# Patient Record
Sex: Male | Born: 1950 | Race: White | Hispanic: No | Marital: Married | Smoking: Never smoker
Health system: Southern US, Community
[De-identification: ages and names within clinical notes are randomized; demographics above are authoritative.]

## PROBLEM LIST (undated history)

## (undated) DIAGNOSIS — E119 Type 2 diabetes mellitus without complications: Secondary | ICD-10-CM

## (undated) DIAGNOSIS — Z9989 Dependence on other enabling machines and devices: Secondary | ICD-10-CM

## (undated) DIAGNOSIS — H269 Unspecified cataract: Secondary | ICD-10-CM

## (undated) DIAGNOSIS — G4733 Obstructive sleep apnea (adult) (pediatric): Secondary | ICD-10-CM

## (undated) DIAGNOSIS — G4730 Sleep apnea, unspecified: Secondary | ICD-10-CM

## (undated) DIAGNOSIS — T7840XA Allergy, unspecified, initial encounter: Secondary | ICD-10-CM

## (undated) DIAGNOSIS — E785 Hyperlipidemia, unspecified: Secondary | ICD-10-CM

## (undated) DIAGNOSIS — Z87442 Personal history of urinary calculi: Secondary | ICD-10-CM

## (undated) HISTORY — DX: Allergy, unspecified, initial encounter: T78.40XA

## (undated) HISTORY — DX: Sleep apnea, unspecified: G47.30

## (undated) HISTORY — DX: Unspecified cataract: H26.9

## (undated) HISTORY — PX: KNEE SURGERY: SHX244

## (undated) HISTORY — DX: Dependence on other enabling machines and devices: Z99.89

## (undated) HISTORY — DX: Type 2 diabetes mellitus without complications: E11.9

## (undated) HISTORY — DX: Morbid (severe) obesity due to excess calories: E66.01

## (undated) HISTORY — DX: Obstructive sleep apnea (adult) (pediatric): G47.33

## (undated) HISTORY — DX: Hyperlipidemia, unspecified: E78.5

## (undated) HISTORY — DX: Personal history of urinary calculi: Z87.442

---

## 1989-04-19 DIAGNOSIS — Z87442 Personal history of urinary calculi: Secondary | ICD-10-CM

## 1989-04-19 HISTORY — DX: Personal history of urinary calculi: Z87.442

## 2007-08-20 HISTORY — PX: COLONOSCOPY: SHX174

## 2008-02-18 ENCOUNTER — Ambulatory Visit: Payer: Self-pay | Admitting: Cardiovascular Disease

## 2008-02-18 ENCOUNTER — Ambulatory Visit: Payer: Self-pay | Admitting: Internal Medicine

## 2008-02-22 ENCOUNTER — Ambulatory Visit: Payer: Self-pay | Admitting: Pulmonary Disease

## 2008-02-22 DIAGNOSIS — G4733 Obstructive sleep apnea (adult) (pediatric): Secondary | ICD-10-CM | POA: Insufficient documentation

## 2008-02-22 HISTORY — DX: Obstructive sleep apnea (adult) (pediatric): G47.33

## 2008-03-04 ENCOUNTER — Encounter: Payer: Self-pay | Admitting: Internal Medicine

## 2008-03-04 ENCOUNTER — Ambulatory Visit: Payer: Self-pay | Admitting: Internal Medicine

## 2008-03-07 ENCOUNTER — Ambulatory Visit: Payer: Self-pay

## 2008-03-07 ENCOUNTER — Encounter: Payer: Self-pay | Admitting: Internal Medicine

## 2008-03-07 ENCOUNTER — Ambulatory Visit: Payer: Self-pay | Admitting: Cardiovascular Disease

## 2008-03-07 ENCOUNTER — Encounter: Payer: Self-pay | Admitting: Cardiovascular Disease

## 2011-01-01 NOTE — Assessment & Plan Note (Signed)
[name]                            CARDIOLOGY OFFICE NOTE   Rodney Turner, Rodney Turner                   MRN:          [id number]  DATE:02/18/2008                            DOB:          [birth year]    Rodney Turner is a pleasant 60 year old patient referred for dyspnea and  abnormal EKG.   The patient has no documented history of coronary artery disease.  Dr.  Cleta Turner did an EKG on him for physical, and he had Q-waves in II, III, and  F.  The patient has not had a previous EKG.  He does get some exertional  dyspnea.  It sounds functional, probably secondary to his weight.  He  has central obesity.  At one point, he was on a step diet and lost about  25 pounds, but he has gained it back over the last year and a half.  He  is fairly sedentary.  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   He has not had any significant chest pain.  In regards to his dyspnea,  he is a nonsmoker.  He does not have a cough.  No sputum production.  No  evidence of previous COPD.   He has not had a previous stress test.  I explained to him the issues  regarding an abnormal EKG, in particular, the question of an old  inferior wall MI.  I suspect it has more to do with rotation of his  heart.  However, he would benefit from an echocardiogram and a stress  Myoview to rule out any occult coronary disease and make sure his  dyspnea is due to central weight gain.   His review of systems is otherwise negative.   PAST MEDICAL HISTORY:  Fairly benign.  He has never had surgery.  He has  had a kidney stone 22 years ago, and otherwise, he has not been in the  hospital.   ALLERGIES:  He is allergic to CODEINE.   His review of systems is otherwise negative.   SOCIAL HISTORY:  He is happily married.  He has 2 older children.  He  golfs from time-to-time.  He sells copiers to school systems.   FAMILY HISTORY:  Remarkable for mother being alive at age 61.   Father  died of esophageal varices at age 29.   MEDICATIONS:  He takes no medicines routinely.   PHYSICAL EXAMINATION:  GENERAL:  His exam is remarkable for an  overweight white male in no distress.  VITAL SIGNS:  Blood pressure is 120/70, pulse 80 and regular,  respiratory 14, and afebrile.  His weight is 245.  HEENT:  Unremarkable. Carotids are normal without bruits.  No  lymphadenopathy, no JVP elevation.  LUNGS:  Clear, good diaphragmatic motion.  No wheezing.  HEART:  S1 and S2 with normal heart sounds.  PMI is not palpable.  ABDOMEN:  Protuberant.  Bowel sounds positive.  No AAA.  No tenderness.  No bruit.  No hepatosplenomegaly.  No hepatojugular reflux.  EXTREMITY:  Distal pulses intact.  No edema.  NEURO:  Nonfocal.  SKIN:  Warm and dry.  No muscular weakness.   EKG shows sinus rhythm with Qs in II, III, and F.   LABORATORY DATA:  His lab work was reviewed from urgent medical care.  His total cholesterol is 145 with an LDL of 80, TSH 1.5, and PSA 0.41.   Office notes from Dr. Cleta Turner reviewed.  EKG also reviewed.   IMPRESSION:  1. 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      Follow up stress Myoview.  2. Dyspnea.  Check 2-D echocardiogram, assess right ventricular and      left ventricular function.  The patient has a body habitus that may      suggest sleep apnea.  We will leave this up to Dr. Cleta Turner to suggest      testing for this.  3. Elevated fasting sugar at 108, probable development of metabolic      syndrome and type 2 diabetes.  I spoke to the patient at length      regarding his weight and possibility, such as the [employer].      He will probably need a hemoglobin A1c to be performed by Dr. Cleta Turner      in the future.  He understands the connection between insulin      resistance and type 2 diabetes.   As long as his echo and Myoview are not abnormal, he will see [place] on an  as-needed basis.     Rodney Pick. Eden Emms, MD, [facility]   Electronically Signed    PCN/MedQ  DD: 02/18/2008  DT: 02/19/2008  Job #: [id number]   cc:   Rodney Turner, M.D.

## 2011-10-20 ENCOUNTER — Ambulatory Visit (INDEPENDENT_AMBULATORY_CARE_PROVIDER_SITE_OTHER): Payer: BC Managed Care – PPO | Admitting: Family Medicine

## 2011-10-20 VITALS — BP 142/83 | HR 61 | Temp 98.2°F | Resp 18 | Ht 67.0 in | Wt 272.4 lb

## 2011-10-20 DIAGNOSIS — J069 Acute upper respiratory infection, unspecified: Secondary | ICD-10-CM

## 2011-10-20 MED ORDER — IPRATROPIUM BROMIDE 0.03 % NA SOLN: 2.0000 | Freq: Four times a day (QID) | NASAL | Status: DC

## 2011-10-20 MED ORDER — BENZONATATE 100 MG PO CAPS: 100.0000 mg | ORAL_CAPSULE | Freq: Three times a day (TID) | ORAL | Status: AC | PRN

## 2011-10-20 NOTE — Progress Notes (Signed)
  Patient Name: Rodney Turner Date of Birth: [birth year] Medical Record Number: [id number] Gender: male Date of Encounter: 10/20/2011  History of Present Illness:  Rodney Turner is a 61 y.o. very pleasant male patient who presents with the following:  Here today with chest congestion and cough- has been treating with mucinex and uses flonase daily.  He has had several episodes of congestion over the last year or so- actually uses mucinex every day.  He was outside in the rain on Wednesday- has been getting worse since then (over the last 3 days).  Added mucinex DM.  Cough has gotten worse, has more of a sore throat which may be due to drainage.  Is coughing up some mucus which is clear.  No fever, no chills, no aches.  However he does feel "rotten."  No earache, no runny nose.    Patient Active Problem List  Diagnoses  . OBSTRUCTIVE SLEEP APNEA   No past medical history on file. No past surgical history on file. History  Substance Use Topics  . Smoking status: Never Smoker   . Smokeless tobacco: Not on file  . Alcohol Use: Not on file   No family history on file. Allergies  Allergen Reactions  . Codeine     REACTION: nausea    Medication list has been reviewed and updated.  Review of Systems: As per HPI- otherwise negative.   Physical Examination: Filed Vitals:   10/20/11 1116  BP: 142/83  Pulse: 61  Temp: 98.2 F (36.8 C)  TempSrc: Oral  Resp: 18  Height: 5\' 7"  (1.702 m)  Weight: 272 lb 6.4 oz (123.56 kg)    Body mass index is 42.66 kg/(m^2).  GEN: WDWN, NAD, Non-toxic, A & O x 3 HEENT: Atraumatic, Normocephalic. Neck supple. No masses, No LAD. TM and oropharynx wnl bilaterally, PEERL Ears and Nose: No external deformity. CV: RRR, No M/G/R. No JVD. No thrill. No extra heart sounds. PULM: CTA B, no wheezes, crackles, rhonchi. No retractions. No resp. distress. No accessory muscle use. ABD: S, NT, ND, +BS. No rebound. No HSM. EXTR: No c/c/e NEURO Normal  gait.  PSYCH: Normally interactive. Conversant. Not depressed or anxious appearing.  Calm demeanor.    Assessment and Plan: 1. URI (upper respiratory infection)  benzonatate (TESSALON) 100 MG capsule, ipratropium (ATROVENT) 0.03 % nasal spray   Treat as above for likely viral URI.  Did give zpack rx to hold- can use if still not better in the next 2 or 3 days.  Let us know if worse!

## 2011-11-04 ENCOUNTER — Ambulatory Visit (INDEPENDENT_AMBULATORY_CARE_PROVIDER_SITE_OTHER): Payer: BC Managed Care – PPO | Admitting: Family Medicine

## 2011-11-04 VITALS — BP 137/83 | HR 71 | Temp 98.1°F | Resp 18 | Ht 68.0 in | Wt 276.0 lb

## 2011-11-04 DIAGNOSIS — M7150 Other bursitis, not elsewhere classified, unspecified site: Secondary | ICD-10-CM

## 2011-11-04 DIAGNOSIS — M7750 Other enthesopathy of unspecified foot: Secondary | ICD-10-CM

## 2011-11-04 MED ORDER — METHYLPREDNISOLONE 4 MG PO TABS: ORAL_TABLET | ORAL | Status: DC

## 2011-11-04 NOTE — Patient Instructions (Signed)
Tendinitis  Tendinitis is swelling and inflammation of the tendons. Tendons are band-like tissues that connect muscle to bone. Tendinitis commonly occurs in the:    Shoulders (rotator cuff).   Heels (Achilles tendon).   Elbows (triceps tendon).  CAUSES  Tendinitis is usually caused by overusing the tendon, muscles, and joints involved. When the tissue surrounding a tendon (synovium) becomes inflamed, it is called tenosynovitis. Tendinitis commonly develops in people whose jobs require repetitive motions.  SYMPTOMS   Pain.   Tenderness.   Mild swelling.  DIAGNOSIS  Tendinitis is usually diagnosed by physical exam. Your caregiver may also order X-rays or other imaging tests.  TREATMENT  Your caregiver may recommend certain medicines or exercises for your treatment.  HOME CARE INSTRUCTIONS    Use a sling or splint for as long as directed by your caregiver until the pain decreases.   Put ice on the injured area.   Put ice in a plastic bag.   Place a towel between your skin and the bag.   Leave the ice on for 15 to 20 minutes, 3 to 4 times a day.   Avoid using the limb while the tendon is painful. Perform gentle range of motion exercises only as directed by your caregiver. Stop exercises if pain or discomfort increase, unless directed otherwise by your caregiver.   Only take over-the-counter or prescription medicines for pain, discomfort, or fever as directed by your caregiver.  SEEK MEDICAL CARE IF:    Your pain and swelling increase.   You develop new, unexplained symptoms, especially increased numbness in the hands.  MAKE SURE YOU:    Understand these instructions.   Will watch your condition.   Will get help right away if you are not doing well or get worse.  Document Released: 08/02/2000 Document Revised: 07/25/2011 Document Reviewed: 10/22/2010  [employer] Patient Information 2012 [employer].

## 2011-11-04 NOTE — Progress Notes (Signed)
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  No h/o gout.  Pain with forced plantar flexion of said toes.  Unresponsive to ibuprofen  O:  Exam shoes mild STS dorsal foot.  The dorsal metatarsal area is area of pain.  FROM but painful active dorsiflexion of toes 2-4   A:  Tendonitis left foot  P:  Medrol 4 mg ii daily with food x 5

## 2012-01-10 ENCOUNTER — Encounter: Payer: Self-pay | Admitting: Pulmonary Disease

## 2012-01-10 ENCOUNTER — Ambulatory Visit (INDEPENDENT_AMBULATORY_CARE_PROVIDER_SITE_OTHER): Payer: BC Managed Care – PPO | Admitting: Pulmonary Disease

## 2012-01-10 VITALS — BP 120/70 | HR 70 | Temp 98.0°F | Ht 67.0 in | Wt 278.6 lb

## 2012-01-10 DIAGNOSIS — G4733 Obstructive sleep apnea (adult) (pediatric): Secondary | ICD-10-CM

## 2012-01-10 NOTE — Patient Instructions (Signed)
Will set up for home sleep testing.  Will arrange followup once results are available.

## 2012-01-10 NOTE — Assessment & Plan Note (Signed)
The patient's history is very suggestive of significant sleep disordered breathing, and when coupled with his obesity and abnormal upper airway, it is very likely that he has clinically significant sleep apnea.  I've had a long discussion with him about sleep apnea, including the pathophysiology and impact the quality of life and cardiovascular health.  Will arrange for home sleep testing, and the patient will followup once the results are available.

## 2012-01-10 NOTE — Progress Notes (Signed)
Addended by: Darrell Jewel on: 01/10/2012 01:51 PM   Modules accepted: Orders

## 2012-01-10 NOTE — Progress Notes (Signed)
  Subjective:    Patient ID: Rodney Turner, male    DOB: 25-Jul-1951, 61 y.o.   MRN: [id number]  HPI The patient is a 61 year old male who comes in today as a self-referral for possible obstructive sleep apnea.  He was last seen 3-4 years ago with a history that suggested sleep disordered breathing.  He was not overly symptomatic, and had no comorbid medical issues.  At that time, the patient decided to take 6 months and work aggressively on weight loss.  He comes in today where he has gained almost 20 pounds since that visit, and is having increased symptoms.  The wife notes very loud snoring as well as an abnormal breathing pattern during sleep.  The patient does not feel rested in the mornings upon arising.  The patient notes definite sleep pressure during the day with periods of inactivity, and the wife has noted that he will fall asleep easily reading or watching television during the day.  Patient also noted some slight pressure with driving long distances.  His Epworth sleepiness score today is 9.   Review of Systems  Constitutional: Negative for fever and unexpected weight change.  HENT: Positive for rhinorrhea and postnasal drip. Negative for ear pain, nosebleeds, congestion, sore throat, sneezing, trouble swallowing, dental problem and sinus pressure.   Eyes: Negative for redness and itching.  Respiratory: Positive for shortness of breath. Negative for cough, chest tightness and wheezing.   Cardiovascular: Negative for palpitations and leg swelling.  Gastrointestinal: Negative for nausea and vomiting.  Genitourinary: Negative for dysuria.  Musculoskeletal: Negative for joint swelling.  Skin: Negative for rash.  Neurological: Positive for headaches.  Hematological: Does not bruise/bleed easily.  Psychiatric/Behavioral: Negative for dysphoric mood. The patient is not nervous/anxious.        Objective:   Physical Exam Constitutional:  Obese male, no acute distress  HENT:  Nares  patent without discharge  Oropharynx without exudate, palate and uvula are elongated, but small posterior space.  Eyes:  Perrla, eomi, no scleral icterus  Neck:  No JVD, no TMG  Cardiovascular:  Normal rate, regular rhythm, no rubs or gallops.  No murmurs        Intact distal pulses  Pulmonary :  Normal breath sounds, no stridor or respiratory distress   No rales, rhonchi, or wheezing  Abdominal:  Soft, nondistended, bowel sounds present.  No tenderness noted.   Musculoskeletal:  Minimal lower extremity edema noted.  Lymph Nodes:  No cervical lymphadenopathy noted  Skin:  No cyanosis noted  Neurologic:  Appears mildly sleepy, appropriate, moves all 4 extremities without obvious deficit.         Assessment & Plan:

## 2012-01-21 ENCOUNTER — Telehealth: Payer: Self-pay | Admitting: Pulmonary Disease

## 2012-01-21 NOTE — Telephone Encounter (Signed)
Called and spoke with AJ at [facility] on 01/20/12. He stated that prior authorization was required for [address] through [employer]. Called [employer] and spoke with Byrd Hesselbach at [employer] who approved home sleep study (Ref # [id number]) Valid dated 6/3-03/19/12. Called and spoke with patient and we scheduled him to pick up home sleep study device on Wed. 01/22/12 around 11:30 am. Pt is aware of above information and will call me directly if he has any other additional questions or problems. Pt plans on doing this study Wed night 01/22/12 and return device on on 01/23/12.

## 2012-01-28 ENCOUNTER — Telehealth: Payer: Self-pay | Admitting: Pulmonary Disease

## 2012-01-28 NOTE — Telephone Encounter (Signed)
Please get pt in for f/u to discuss sleep study results.

## 2012-01-29 ENCOUNTER — Ambulatory Visit (INDEPENDENT_AMBULATORY_CARE_PROVIDER_SITE_OTHER): Payer: BC Managed Care – PPO | Admitting: Pulmonary Disease

## 2012-01-29 DIAGNOSIS — G4733 Obstructive sleep apnea (adult) (pediatric): Secondary | ICD-10-CM

## 2012-01-29 NOTE — Telephone Encounter (Signed)
Pt is scheduled for follow-up on Fri., 6/14 @ 4:30pm with KC to discuss sleep results.

## 2012-01-31 ENCOUNTER — Ambulatory Visit (INDEPENDENT_AMBULATORY_CARE_PROVIDER_SITE_OTHER): Payer: BC Managed Care – PPO | Admitting: Pulmonary Disease

## 2012-01-31 ENCOUNTER — Encounter: Payer: Self-pay | Admitting: Pulmonary Disease

## 2012-01-31 VITALS — BP 130/80 | HR 68 | Temp 98.0°F | Ht 67.0 in | Wt 281.2 lb

## 2012-01-31 DIAGNOSIS — G4733 Obstructive sleep apnea (adult) (pediatric): Secondary | ICD-10-CM

## 2012-01-31 NOTE — Assessment & Plan Note (Signed)
The patient has severe obstructive sleep apnea by his recent sleep study, and I have recommended a trial of CPAP while he is trying to work on weight loss.  The patient is willing to do this.  I have also encouraged him to work aggressively on weight loss. I will set the patient up on cpap at a moderate pressure level to allow for desensitization, and will troubleshoot the device over the next 4-6weeks if needed.  The pt is to call me if having issues with tolerance.  Will then optimize the pressure once patient is able to wear cpap on a consistent basis.

## 2012-01-31 NOTE — Progress Notes (Signed)
  Subjective:    Patient ID: Rodney Turner, male    DOB: October 13, 1950, 61 y.o.   MRN: [id number]  HPI The patient comes in today for followup of his recent sleep study.  He was found to have severe obstructive sleep apnea, with an AHI of 51 events per hour.  I have reviewed the study in detail with him, and answered all of his questions.   Review of Systems  Constitutional: Negative for fever and unexpected weight change.  HENT: Positive for rhinorrhea and postnasal drip. Negative for ear pain, nosebleeds, congestion, sore throat, sneezing, trouble swallowing, dental problem and sinus pressure.   Eyes: Negative for redness and itching.  Respiratory: Positive for shortness of breath. Negative for cough, chest tightness and wheezing.   Cardiovascular: Negative for palpitations and leg swelling.  Gastrointestinal: Negative for nausea and vomiting.  Genitourinary: Negative for dysuria.  Musculoskeletal: Negative for joint swelling.  Skin: Negative for rash.  Neurological: Negative for headaches.  Hematological: Does not bruise/bleed easily.  Psychiatric/Behavioral: Negative for dysphoric mood. The patient is not nervous/anxious.        Objective:   Physical Exam Obese male in no acute distress Nose without purulence or discharge noted Lower extremities with mild edema, no cyanosis Alert and oriented, moves all 4 extremities.       Assessment & Plan:

## 2012-01-31 NOTE — Patient Instructions (Addendum)
Will start on cpap at moderate pressure level.  Please call if having tolerance issues.  Work on weight loss followup with me in 6 weeks.  

## 2012-02-05 ENCOUNTER — Telehealth: Payer: Self-pay | Admitting: Pulmonary Disease

## 2012-02-05 NOTE — Telephone Encounter (Signed)
Spoke with Lecretia-states that [facility] told her that the insurance requires auto titration; we have never heard of this nor have we had any kick backs from insurance after placing a pt on CPAP based on home sleep study. Therefore no order sent for auto titration and Mayra Reel is handling this with [facility].

## 2012-03-18 ENCOUNTER — Encounter: Payer: Self-pay | Admitting: Pulmonary Disease

## 2012-03-18 ENCOUNTER — Ambulatory Visit (INDEPENDENT_AMBULATORY_CARE_PROVIDER_SITE_OTHER): Payer: BC Managed Care – PPO | Admitting: Pulmonary Disease

## 2012-03-18 VITALS — BP 120/80 | HR 61 | Temp 97.7°F | Ht 67.0 in | Wt 276.4 lb

## 2012-03-18 DIAGNOSIS — G4733 Obstructive sleep apnea (adult) (pediatric): Secondary | ICD-10-CM

## 2012-03-18 NOTE — Progress Notes (Signed)
  Subjective:    Patient ID: Rodney Turner, male    DOB: November 12, 1950, 61 y.o.   MRN: [id number]  HPI The patient comes in today for followup of his known sleep apnea.  He was started on CPAP at a moderate pressure level of the last visit, and is done very well with the device.  He is currently using nasal was without issues, and has no problems with pressure tolerance.  He feels that his sleep is much improved, and has had increased daytime alertness.   Review of Systems  Constitutional: Negative for fever and unexpected weight change.  HENT: Positive for rhinorrhea. Negative for ear pain, nosebleeds, congestion, sore throat, sneezing, trouble swallowing, dental problem, postnasal drip and sinus pressure.   Eyes: Negative for redness and itching.  Respiratory: Negative for cough, chest tightness, shortness of breath and wheezing.   Cardiovascular: Negative for palpitations and leg swelling.  Gastrointestinal: Negative for nausea and vomiting.  Genitourinary: Negative for dysuria.  Musculoskeletal: Negative for joint swelling.  Skin: Negative for rash.  Neurological: Negative for headaches.  Hematological: Does not bruise/bleed easily.  Psychiatric/Behavioral: Negative for dysphoric mood. The patient is not nervous/anxious.   All other systems reviewed and are negative.       Objective:   Physical Exam overweight male in no acute distress No skin breakdown or pressure necrosis from the CPAP mask Lower extremities without edema, no cyanosis Alert and oriented, moves all 4 extremities, does not appear to be sleepy.       Assessment & Plan:

## 2012-03-18 NOTE — Assessment & Plan Note (Signed)
The patient is doing very well on CPAP, and has seen significant improvement in his sleep and daytime alertness.  At this point, we need to optimize his pressure for him, and we'll do this through the automatic setting.  I've also encouraged him to work aggressively on weight loss. Care Plan:  At this point, will arrange for the patient's machine to be changed over to auto mode for 2 weeks to optimize their pressure.  I will review the downloaded data once sent by dme, and also evaluate for compliance, leaks, and residual osa.  I will call the patient and dme to discuss the results, and have the patient's machine set appropriately.  This will serve as the pt's cpap pressure titration.

## 2012-03-18 NOTE — Patient Instructions (Addendum)
Will put your machine on the auto setting to optimize pressure, and will let you know the results. Work on weight loss followup with me in 6mos.

## 2012-03-19 ENCOUNTER — Encounter: Payer: Self-pay | Admitting: Pulmonary Disease

## 2012-04-29 ENCOUNTER — Encounter: Payer: Self-pay | Admitting: Pulmonary Disease

## 2012-04-30 ENCOUNTER — Other Ambulatory Visit: Payer: Self-pay | Admitting: Pulmonary Disease

## 2012-04-30 DIAGNOSIS — G4733 Obstructive sleep apnea (adult) (pediatric): Secondary | ICD-10-CM

## 2012-06-23 ENCOUNTER — Ambulatory Visit (INDEPENDENT_AMBULATORY_CARE_PROVIDER_SITE_OTHER): Payer: BC Managed Care – PPO | Admitting: *Deleted

## 2012-06-23 DIAGNOSIS — Z23 Encounter for immunization: Secondary | ICD-10-CM

## 2012-07-10 ENCOUNTER — Ambulatory Visit (INDEPENDENT_AMBULATORY_CARE_PROVIDER_SITE_OTHER): Payer: BC Managed Care – PPO | Admitting: Physician Assistant

## 2012-07-10 VITALS — BP 130/78 | HR 73 | Temp 97.9°F | Resp 16 | Ht 67.0 in | Wt 279.0 lb

## 2012-07-10 DIAGNOSIS — R0982 Postnasal drip: Secondary | ICD-10-CM

## 2012-07-10 DIAGNOSIS — R05 Cough: Secondary | ICD-10-CM

## 2012-07-10 DIAGNOSIS — J329 Chronic sinusitis, unspecified: Secondary | ICD-10-CM

## 2012-07-10 MED ORDER — IPRATROPIUM BROMIDE 0.03 % NA SOLN: 2.0000 | Freq: Two times a day (BID) | NASAL | Status: DC

## 2012-07-10 MED ORDER — BENZONATATE 200 MG PO CAPS: 200.0000 mg | ORAL_CAPSULE | Freq: Three times a day (TID) | ORAL | Status: DC | PRN

## 2012-07-10 NOTE — Progress Notes (Signed)
  Subjective:    Patient ID: Rodney Turner, male    DOB: 12-Oct-1950, 61 y.o.   MRN: [id number]  HPI  Rodney Turner is a 61 yr old male here with three days of malaise and fatigue and one day of cough.  This cough is minimally productive.  Feels like it is in his throat more than his chest.  Voice is becoming hoarse.  Denies sore throat, ear pain, headache, or runny nose.  Some nasal congestion has started today.  Feels like there is some post-nasal drainage.  No fever or chills.  Wife has been coughing for two weeks as well.  History of OSA.  Uses CPAP nightly.  No asthma or COPD history.  Never smoker.  Has been using flonase daily, mucinex bid, and tessalon perles.   Review of Systems  Constitutional: Negative for fever and diaphoresis.  HENT: Positive for postnasal drip. Negative for ear pain, congestion, sore throat, rhinorrhea and sinus pressure.   Respiratory: Positive for cough. Negative for shortness of breath and wheezing.   Cardiovascular: Negative.   Gastrointestinal: Negative.   Musculoskeletal: Negative.   Skin: Negative.   Neurological: Negative.        Objective:   Physical Exam  Vitals reviewed. Constitutional: He is oriented to person, place, and time. He appears well-developed and well-nourished. No distress.  HENT:  Head: Normocephalic and atraumatic.  Right Ear: Tympanic membrane and ear canal normal.  Left Ear: Tympanic membrane and ear canal normal.  Nose: Right sinus exhibits no maxillary sinus tenderness and no frontal sinus tenderness. Left sinus exhibits no maxillary sinus tenderness and no frontal sinus tenderness.  Mouth/Throat: Uvula is midline, oropharynx is clear and moist and mucous membranes are normal.  Neck: Neck supple.  Cardiovascular: Normal rate, regular rhythm and normal heart sounds.  Exam reveals no gallop and no friction rub.   No murmur heard. Pulmonary/Chest: Effort normal and breath sounds normal. He has no wheezes. He has no rales.    Lymphadenopathy:    He has no cervical adenopathy.  Neurological: He is alert and oriented to person, place, and time.  Skin: Skin is warm and dry.  Psychiatric: He has a normal mood and affect. His behavior is normal.     Filed Vitals:   07/10/12 1644  BP: 130/78  Pulse: 73  Temp: 97.9 F (36.6 C)  Resp: 16         Assessment & Plan:   1. Cough  benzonatate (TESSALON) 200 MG capsule  2. Post-nasal drainage  ipratropium (ATROVENT) 0.03 % nasal spray    Rodney Turner is a 61 yr old male here with cough.  He is afebrile and lungs are CTA.  Suspect viral etiology.  Will treat with Atrovent for post-nasal drainage and Tessalon for cough.  He may continue to use Mucinex.  Encouraged him to continue Flonase daily.  He would like to try OTC Delsym for cough.  If he requires something stronger, he will call and we can send something in for him.  Push fluids and rest as much as possible.  If worsening or not improving he will RTC.

## 2012-07-10 NOTE — Patient Instructions (Signed)
Use Tessalon Perles up to three times per day for cough.  Try Delsym for cough at night.  If Delsym is not strong enough, let us know and we can send something stronger.  Use Atrovent nasal spray twice daily for relief of post-nasal drainage.  You may continue using Mucinex twice daily as well.  Drink plenty of fluids and get plenty of rest.  If you are worsening or not improving, call or come back in.

## 2012-08-25 ENCOUNTER — Telehealth: Payer: Self-pay | Admitting: *Deleted

## 2012-08-25 MED ORDER — FLUTICASONE PROPIONATE 50 MCG/ACT NA SUSP: 2.0000 | Freq: Every day | NASAL | Status: DC

## 2012-08-25 NOTE — Telephone Encounter (Signed)
Pharmacy requesting refill on flonase. Last refill on 06/24/12

## 2012-08-25 NOTE — Telephone Encounter (Signed)
Flonase sent to pharmacy

## 2012-08-25 NOTE — Telephone Encounter (Signed)
Called pt, [facility] sent

## 2012-09-18 ENCOUNTER — Ambulatory Visit (INDEPENDENT_AMBULATORY_CARE_PROVIDER_SITE_OTHER): Payer: BC Managed Care – PPO | Admitting: Pulmonary Disease

## 2012-09-18 ENCOUNTER — Encounter: Payer: Self-pay | Admitting: Pulmonary Disease

## 2012-09-18 VITALS — BP 128/82 | HR 71 | Temp 97.9°F | Ht 67.5 in | Wt 275.0 lb

## 2012-09-18 DIAGNOSIS — G4733 Obstructive sleep apnea (adult) (pediatric): Secondary | ICD-10-CM

## 2012-09-18 NOTE — Assessment & Plan Note (Signed)
The patient is doing very well on CPAP with no tolerance issues.  He is satisfied with his sleep and daytime alertness.  I've asked him to work aggressively on weight loss, and to keep up with his mask changes and supplies.

## 2012-09-18 NOTE — Patient Instructions (Addendum)
Continue with cpap, and work on weight loss followup with me in one year if doing well.

## 2012-09-18 NOTE — Progress Notes (Signed)
  Subjective:    Patient ID: VIRGIL LIGHTNER, male    DOB: 10-15-1950, 62 y.o.   MRN: [id number]  HPI The patient comes in today for followup of his known severe obstructive sleep apnea.  He is wearing CPAP compliantly, and feels that he is sleeping well with excellent daytime alertness.  He is having no issues with his CPAP mask, and feels that his pressure is adequate since being placed on a fixed setting.   Review of Systems  Constitutional: Negative for fever and unexpected weight change.  HENT: Positive for rhinorrhea. Negative for ear pain, nosebleeds, congestion, sore throat, sneezing, trouble swallowing, dental problem, postnasal drip and sinus pressure.   Eyes: Negative for redness and itching.  Respiratory: Negative for cough, chest tightness, shortness of breath and wheezing.   Cardiovascular: Negative for palpitations and leg swelling.  Gastrointestinal: Negative for nausea and vomiting.  Genitourinary: Negative for dysuria.  Musculoskeletal: Negative for joint swelling.  Skin: Negative for rash.  Neurological: Negative for headaches.  Hematological: Does not bruise/bleed easily.  Psychiatric/Behavioral: Negative for dysphoric mood. The patient is not nervous/anxious.        Objective:   Physical Exam Obese male in no acute distress Nose without purulence or discharge noted No skin breakdown or pressure necrosis from the CPAP mask Neck without lymphadenopathy or thyromegaly Lower extremities without significant edema, no cyanosis Alert and oriented, moves all 4 extremities.  Does not appear to be sleepy.       Assessment & Plan:

## 2013-05-27 ENCOUNTER — Other Ambulatory Visit: Payer: Self-pay | Admitting: Emergency Medicine

## 2013-07-01 ENCOUNTER — Other Ambulatory Visit: Payer: Self-pay | Admitting: Physician Assistant

## 2013-07-07 ENCOUNTER — Ambulatory Visit (INDEPENDENT_AMBULATORY_CARE_PROVIDER_SITE_OTHER): Payer: BC Managed Care – PPO | Admitting: Family Medicine

## 2013-07-07 VITALS — BP 130/80 | HR 84 | Temp 98.0°F | Resp 17 | Ht 67.0 in | Wt 284.0 lb

## 2013-07-07 DIAGNOSIS — J309 Allergic rhinitis, unspecified: Secondary | ICD-10-CM

## 2013-07-07 DIAGNOSIS — Z23 Encounter for immunization: Secondary | ICD-10-CM

## 2013-07-07 MED ORDER — FLUTICASONE PROPIONATE 50 MCG/ACT NA SUSP: 2.0000 | Freq: Every day | NASAL | Status: DC

## 2013-07-07 NOTE — Progress Notes (Signed)
[facility]:  Office Visit  Chief Complaint:  Chief Complaint  Patient presents with  . Immunizations    flu shot   . Medication Refill    HPI: Rodney Turner is a 62 y.o. male who is here for refills for his allergic rhinitis medicine, flonase, uses it regular. No SEs, He gets clogged up when he does not use it. He also uses Mucinex daily and he stays clear with that combination. When he comes off mucinex then he notices that there is a little bit of difference. He has OSA on CPAP . HE is complaint with CPAP. Would also like flu vaccine.   Past Medical History  Diagnosis Date  . Allergic rhinitis    Past Surgical History  Procedure Laterality Date  . Knee surgery      left   History   Social History  . Marital Status: Married    Spouse Name: N/A    Number of Children: N/A  . Years of Education: N/A   Occupational History  . sales    Social History Main Topics  . Smoking status: Never Smoker   . Smokeless tobacco: Never Used  . Alcohol Use: No  . Drug Use: No  . Sexual Activity: Yes    Birth Control/ Protection: None   Other Topics Concern  . None   Social History Narrative  . None   Family History  Problem Relation Age of Onset  . Snoring Father   . Esophageal varices Father   . Diabetes Paternal Grandmother    Allergies  Allergen Reactions  . Codeine     REACTION: nausea   Prior to Admission medications   Medication Sig Start Date End Date Taking? Authorizing Provider  aspirin 81 MG tablet Take 81 mg by mouth daily.   Yes Historical Provider, MD  benzonatate (TESSALON) 200 MG capsule Take 1 capsule (200 mg total) by mouth 3 (three) times daily as needed for cough. 07/10/12  Yes Eleanore E Egan, PA-C  fluticasone (FLONASE) 50 MCG/ACT nasal spray Place 2 sprays into the nose daily. PATIENT NEEDS OFFICE VISIT FOR ADDITIONAL REFILLS 05/27/13  Yes Eleanore E Egan, PA-C  guaiFENesin (MUCINEX) 600 MG 12 hr tablet Take by mouth 2 (two)  times daily.   Yes Historical Provider, MD  ibuprofen (ADVIL,MOTRIN) 100 MG tablet Take 100 mg by mouth as needed.   Yes Historical Provider, MD     ROS: The patient denies fevers, chills, night sweats, unintentional weight loss, chest pain, palpitations, wheezing, dyspnea on exertion, nausea, vomiting, abdominal pain, dysuria, hematuria, melena, numbness, weakness, or tingling.   All other systems have been reviewed and were otherwise negative with the exception of those mentioned in the HPI and as above.    PHYSICAL EXAM: Filed Vitals:   07/07/13 1435  BP: 130/80  Pulse: 84  Temp: 98 F (36.7 C)  Resp: 17   Filed Vitals:   07/07/13 1435  Height: 5\' 7"  (1.702 m)  Weight: 284 lb (128.822 kg)   Body mass index is 44.47 kg/(m^2).  General: Alert, no acute distress, morbidly obese White male HEENT:  Normocephalic, atraumatic, oropharynx patent. EOMI, PERRLA, boggy nares, TM nl Cardiovascular:  Regular rate and rhythm, no rubs murmurs or gallops.  No Carotid bruits, radial pulse intact. No pedal edema.  Respiratory: Clear to auscultation bilaterally.  No wheezes, rales, or rhonchi.  No cyanosis, no use of accessory musculature GI: No organomegaly, abdomen is soft and non-tender, positive bowel sounds.  No masses. Skin: No rashes. Neurologic: Facial musculature symmetric. Psychiatric: Patient is appropriate throughout our interaction. Lymphatic: No cervical lymphadenopathy Musculoskeletal: Gait intact.   LABS: No results found for this or any previous visit.   EKG/XRAY:   Primary read interpreted by Dr. Conley Rolls at [facility].   ASSESSMENT/PLAN: Encounter Diagnoses  Name Primary?  . Allergic rhinitis Yes  . Needs flu shot    Refilled Flonase Flu vaccine given F/u prn  Gross sideeffects, risk and benefits, and alternatives of medications d/w patient. Patient is aware that all medications have potential sideeffects and we are unable to predict every sideeffect or drug-drug  interaction that may occur.  Lakevia Perris PHUONG, DO 07/07/2013 3:13 PM

## 2013-08-08 ENCOUNTER — Ambulatory Visit (INDEPENDENT_AMBULATORY_CARE_PROVIDER_SITE_OTHER): Payer: BC Managed Care – PPO | Admitting: Family Medicine

## 2013-08-08 VITALS — BP 152/78 | HR 76 | Temp 98.8°F | Resp 16 | Ht 67.0 in | Wt 286.0 lb

## 2013-08-08 DIAGNOSIS — J069 Acute upper respiratory infection, unspecified: Secondary | ICD-10-CM

## 2013-08-08 MED ORDER — AZITHROMYCIN 250 MG PO TABS: ORAL_TABLET | ORAL | Status: DC

## 2013-08-08 MED ORDER — HYDROCODONE-HOMATROPINE 5-1.5 MG/5ML PO SYRP: 5.0000 mL | ORAL_SOLUTION | Freq: Three times a day (TID) | ORAL | Status: DC | PRN

## 2013-08-08 NOTE — Progress Notes (Signed)
Rodney Turner MRN: [id number], DOB: Dec 19, 1950, 62 y.o. Date of Encounter: 08/08/2013, 1:55 PM  Primary Physician: Lucilla Edin, MD  Chief Complaint:  Chief Complaint  Patient presents with  . Cough  . Nasal Congestion    HPI: 62 y.o. year old male presents with a 2 day history of nasal congestion, post nasal drip, sore throat, and cough. Mild sinus pressure. Afebrile. No chills. Nasal congestion thick and green/yellow. Cough is productive of green/yellow sputum and not associated with time of day. Ears feel full, leading to sensation of muffled hearing. Has tried OTC cold preps without success. No GI complaints.   No sick contacts, recent antibiotics, or recent travels.   No leg trauma, sedentary periods, h/o cancer, or tobacco use.  Past Medical History  Diagnosis Date  . Allergic rhinitis      Home Meds: Prior to Admission medications   Medication Sig Start Date End Date Taking? Authorizing Provider  aspirin 81 MG tablet Take 81 mg by mouth daily.   Yes Historical Provider, MD  fluticasone (FLONASE) 50 MCG/ACT nasal spray Place 2 sprays into both nostrils daily. 07/07/13  Yes Thao P Le, DO  guaiFENesin (MUCINEX) 600 MG 12 hr tablet Take by mouth 2 (two) times daily.   Yes Historical Provider, MD  benzonatate (TESSALON) 200 MG capsule Take 1 capsule (200 mg total) by mouth 3 (three) times daily as needed for cough. 07/10/12   Eleanore Delia Chimes, PA-C  ibuprofen (ADVIL,MOTRIN) 100 MG tablet Take 100 mg by mouth as needed.    Historical Provider, MD    Allergies:  Allergies  Allergen Reactions  . Codeine     REACTION: nausea    History   Social History  . Marital Status: Married    Spouse Name: N/A    Number of Children: N/A  . Years of Education: N/A   Occupational History  . sales    Social History Main Topics  . Smoking status: Never Smoker   . Smokeless tobacco: Never Used  . Alcohol Use: No  . Drug Use: No  . Sexual Activity: Yes    Birth Control/  Protection: None   Other Topics Concern  . Not on file   Social History Narrative  . No narrative on file     Review of Systems: Constitutional: negative for chills, fever, night sweats or weight changes Cardiovascular: negative for chest pain or palpitations Respiratory: negative for hemoptysis, wheezing, or shortness of breath Abdominal: negative for abdominal pain, nausea, vomiting or diarrhea Dermatological: negative for rash Neurologic: negative for headache   Physical Exam: Blood pressure 152/78, pulse 76, temperature 98.8 F (37.1 C), temperature source Oral, resp. rate 16, height 5\' 7"  (1.702 m), weight 286 lb (129.729 kg), SpO2 96.00%., Body mass index is 44.78 kg/(m^2). General: Well developed, well nourished, in no acute distress. Head: Normocephalic, atraumatic, eyes without discharge, sclera non-icteric, nares are congested. Bilateral auditory canals clear, TM's are without perforation, pearly grey with reflective cone of light bilaterally. No sinus TTP. Oral cavity moist, dentition normal. Posterior pharynx with post nasal drip and mild erythema. No peritonsillar abscess or tonsillar exudate. Neck: Supple. No thyromegaly. Full ROM. No lymphadenopathy. Lungs: Coarse breath sounds bilaterally without wheezes, rales, or rhonchi. Breathing is unlabored.  Heart: RRR with S1 S2. No murmurs, rubs, or gallops appreciated. Msk:  Strength and tone normal for age. Extremities: No clubbing or cyanosis. No edema. Neuro: Alert and oriented X 3. Moves all extremities spontaneously. CNII-XII grossly in tact. Psych:  Responds to questions appropriately with a normal affect.    ASSESSMENT AND PLAN:  62 y.o. year old male with bronchitis. Acute URI - Plan: azithromycin (ZITHROMAX Z-PAK) 250 MG tablet, HYDROcodone-homatropine (HYCODAN) 5-1.5 MG/5ML syrup   - -Tylenol/Motrin prn -Rest/fluids -RTC precautions -RTC 3-5 days if no improvement  Signed, Elvina Sidle,  MD 08/08/2013 1:55 PM

## 2013-09-17 ENCOUNTER — Encounter: Payer: Self-pay | Admitting: Pulmonary Disease

## 2013-09-17 ENCOUNTER — Encounter (INDEPENDENT_AMBULATORY_CARE_PROVIDER_SITE_OTHER): Payer: Self-pay

## 2013-09-17 ENCOUNTER — Ambulatory Visit (INDEPENDENT_AMBULATORY_CARE_PROVIDER_SITE_OTHER): Payer: BC Managed Care – PPO | Admitting: Pulmonary Disease

## 2013-09-17 VITALS — BP 140/92 | HR 69 | Temp 98.2°F | Ht 67.0 in | Wt 287.0 lb

## 2013-09-17 DIAGNOSIS — G4733 Obstructive sleep apnea (adult) (pediatric): Secondary | ICD-10-CM

## 2013-09-17 NOTE — Progress Notes (Signed)
   Subjective:    Patient ID: Rodney Turner, male    DOB: 1950-10-26, 63 y.o.   MRN: [id number]  HPI The patient comes in today for followup of his obstructive sleep apnea. He is wearing CPAP compliantly, and is not having any issues with his mask or pressure. He feels that he is sleeping well with the device, and has adequate daytime alertness. Of note, his weight is up 12 pounds since last visit.   Review of Systems  Constitutional: Negative for fever and unexpected weight change.  HENT: Positive for congestion. Negative for dental problem, ear pain, nosebleeds, postnasal drip, rhinorrhea, sinus pressure, sneezing, sore throat and trouble swallowing.   Eyes: Negative for redness and itching.  Respiratory: Negative for cough, chest tightness, shortness of breath and wheezing.   Cardiovascular: Negative for palpitations and leg swelling.  Gastrointestinal: Negative for nausea and vomiting.  Genitourinary: Negative for dysuria.  Musculoskeletal: Negative for joint swelling.  Skin: Negative for rash.  Neurological: Negative for headaches.  Hematological: Does not bruise/bleed easily.  Psychiatric/Behavioral: Negative for dysphoric mood. The patient is not nervous/anxious.        Objective:   Physical Exam Overweight male in no acute distress Nose without purulence or discharge noted No skin breakdown or pressure necrosis from the CPAP Neck without lymphadenopathy or thyromegaly Lower extremities with mild edema, no cyanosis Alert and oriented, moves all 4 extremities. Does not appear to be sleepy.       Assessment & Plan:

## 2013-09-17 NOTE — Patient Instructions (Signed)
Continue with cpap, and keep up with mask changes and supplies. Work on weight loss followup with me in one year if doing well.  

## 2013-09-17 NOTE — Assessment & Plan Note (Signed)
The patient is doing fairly well from a sleep apnea standpoint on his current CPAP setup. I've asked him to continue with his device, and to work aggressively on weight loss. I've also reminded him to keep up with his mask changes and supplies.

## 2014-01-18 ENCOUNTER — Encounter: Payer: Self-pay | Admitting: Emergency Medicine

## 2014-01-18 ENCOUNTER — Ambulatory Visit (INDEPENDENT_AMBULATORY_CARE_PROVIDER_SITE_OTHER): Payer: BC Managed Care – PPO | Admitting: Emergency Medicine

## 2014-01-18 ENCOUNTER — Ambulatory Visit: Payer: BC Managed Care – PPO

## 2014-01-18 VITALS — BP 124/80 | HR 65 | Temp 98.0°F | Resp 16 | Ht 66.75 in | Wt 258.6 lb

## 2014-01-18 DIAGNOSIS — M25539 Pain in unspecified wrist: Secondary | ICD-10-CM

## 2014-01-18 DIAGNOSIS — M25532 Pain in left wrist: Secondary | ICD-10-CM
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
## 2014-02-14 ENCOUNTER — Ambulatory Visit (INDEPENDENT_AMBULATORY_CARE_PROVIDER_SITE_OTHER): Payer: BC Managed Care – PPO | Admitting: Internal Medicine

## 2014-02-14 VITALS — BP 122/70 | HR 82 | Temp 97.9°F | Resp 16 | Ht 67.0 in | Wt 282.0 lb

## 2014-02-14 DIAGNOSIS — J019 Acute sinusitis, unspecified: Secondary | ICD-10-CM

## 2014-02-14 MED ORDER — AMOXICILLIN 500 MG PO CAPS
1000.0000 mg | ORAL_CAPSULE | Freq: Two times a day (BID) | ORAL | Status: AC
Start: 2014-02-14 — End: 2014-02-24

## 2014-02-14 NOTE — Progress Notes (Signed)
° °  Subjective:  This chart was scribed for Tami Lin, MD by Randa Evens, ED Scribe. This Patient was seen in room 11 and the patients care was started at 5:22 PM   Patient ID: DENE NAZIR, male    DOB: [birth year], 63 y.o.   MRN: [id number]  HPI HPI Comments: DONTA FUSTER is a 63 y.o. male who presents to the [facility] complaining of illness onset 1 week prior. He states that he recently has been feeling sick. He states he has associated sore throat, congestion, and cough. He states he has noticed some drainage yellow in color. He states she is unsure if it due to the recent change in climate. He states he has been taking fluticasone and mucinex with no relief. He denies fever or any other related symptoms.   New brass quintet Review of Systems  Constitutional: Negative for fever.  HENT: Positive for congestion and sore throat.   Respiratory: Positive for cough.      Objective:    Physical Exam  Nursing note and vitals reviewed. Constitutional: He is oriented to person, place, and time. He appears well-developed and well-nourished. No distress.  HENT:  Head: Normocephalic and atraumatic.  Right Ear: External ear normal.  Left Ear: External ear normal.  Mouth/Throat: Oropharynx is clear and moist.  purulent discharge bothe nares.  Eyes: Conjunctivae and EOM are normal.  Neck: Neck supple.  Cardiovascular: Normal rate.   Pulmonary/Chest: Effort normal and breath sounds normal. No respiratory distress. He has no wheezes.  Musculoskeletal: Normal range of motion.  Lymphadenopathy:    He has no cervical adenopathy.  Neurological: He is alert and oriented to person, place, and time.  Skin: Skin is warm and dry.  Psychiatric: He has a normal mood and affect. His behavior is normal.        Assessment & Plan:   Acute sinusitis, unspecified  Meds ordered this encounter  Medications   amoxicillin (AMOXIL) 500 MG capsule    Sig: Take 2  capsules (1,000 mg total) by mouth 2 (two) times daily.    Dispense:  40 capsule    Refill:  0   flonase/mucinex    I have completed the patient encounter in its entirety as documented by the scribe, with editing by me where necessary. Robert P. Laney Pastor, M.D.

## 2014-03-17 ENCOUNTER — Ambulatory Visit (INDEPENDENT_AMBULATORY_CARE_PROVIDER_SITE_OTHER): Payer: BC Managed Care – PPO | Admitting: Emergency Medicine

## 2014-03-17 VITALS — BP 122/70 | HR 67 | Temp 97.7°F | Resp 16 | Ht 67.0 in | Wt 283.6 lb

## 2014-03-17 DIAGNOSIS — M25569 Pain in unspecified knee: Secondary | ICD-10-CM

## 2014-03-17 DIAGNOSIS — M25561 Pain in right knee: Secondary | ICD-10-CM

## 2014-03-17 HISTORY — DX: Morbid (severe) obesity due to excess calories: E66.01

## 2014-03-17 MED ORDER — NAPROXEN SODIUM 550 MG PO TABS: 550.0000 mg | ORAL_TABLET | Freq: Two times a day (BID) | ORAL | Status: DC

## 2014-03-17 NOTE — Progress Notes (Signed)
[facility] [address] [place] [address] [phone]  Date:  03/17/2014   Name:  Rodney Turner   DOB:  1951-06-24   MRN:  [id number]  PCP:  Jenny Reichmann, MD    Chief Complaint: Knee Pain   History of Present Illness:  Rodney Turner is a 63 y.o. very pleasant male patient who presents with the following:  Driving yesterday and felt a sharp pain in back of right knee.  Pain later a dull pain that is present if he sits with his legs straight in front.  Worse last night sitting on the bed watching TV.  No history of injury or overuse.  No radiation of pain, swelling or knee, ecchymosis. No numbness or tingling.  No history of problem with knee or injury previously.  Takes no medication and is a non smoker.  No pain with standing or walking, bending knee.   No improvement with over the counter medications or other home remedies. Denies other complaint or health concern today.   Patient Active Problem List   Diagnosis Date Noted  . Morbid obesity 03/17/2014  . OBSTRUCTIVE SLEEP APNEA 02/22/2008    Past Medical History  Diagnosis Date  . Allergic rhinitis     Past Surgical History  Procedure Laterality Date  . Knee surgery      left    History  Substance Use Topics  . Smoking status: Never Smoker   . Smokeless tobacco: Never Used  . Alcohol Use: No    Family History  Problem Relation Age of Onset  . Snoring Father   . Esophageal varices Father   . Diabetes Paternal Grandmother     Allergies  Allergen Reactions  . Codeine     REACTION: nausea    Medication list has been reviewed and updated.  Current Outpatient Prescriptions on File Prior to Visit  Medication Sig Dispense Refill  . aspirin 81 MG tablet Take 81 mg by mouth daily.      . benzonatate (TESSALON) 200 MG capsule Take 1 capsule (200 mg total) by mouth 3 (three) times daily as needed for cough.  40 capsule  0  . dextromethorphan (DELSYM) 30 MG/5ML liquid Take 15 mg by  mouth as needed for cough.      . fluticasone (FLONASE) 50 MCG/ACT nasal spray Place 2 sprays into both nostrils daily.  16 g  11  . guaiFENesin (MUCINEX) 600 MG 12 hr tablet Take 600 mg by mouth daily.       Marland Kitchen ibuprofen (ADVIL,MOTRIN) 100 MG tablet Take 100 mg by mouth as needed.       No current facility-administered medications on file prior to visit.    Review of Systems:  As per HPI, otherwise negative.    Physical Examination: Filed Vitals:   03/17/14 1457  BP: 122/70  Pulse: 67  Temp: 97.7 F (36.5 C)  Resp: 16   Filed Vitals:   03/17/14 1457  Height: 5\' 7"  (1.702 m)  Weight: 283 lb 9.6 oz (128.64 kg)   Body mass index is 44.41 kg/(m^2). Ideal Body Weight: Weight in (lb) to have BMI = 25: 159.3   GEN: morbidly obese, NAD, Non-toxic, Alert & Oriented x 3 HEENT: Atraumatic, Normocephalic.  Ears and Nose: No external deformity. EXTR: No clubbing/cyanosis/edema NEURO: Normal gait.  PSYCH: Normally interactive. Conversant. Not depressed or anxious appearing.  Calm demeanor.  RIGHT knee:  Full AROM and PROM.  No effusion.  Not warm.  Not tender or ecchymotic.  No limitation in ambulation.  SLR negative.   Joint stable.    Assessment and Plan: Knee pain.  No evidence for DVT.   Consider sciatica Will start on anaprox and if no improvement will get an MRI   Signed,  Ellison Carwin, MD

## 2014-03-17 NOTE — Patient Instructions (Signed)
Sciatica Sciatica is pain, weakness, numbness, or tingling along the path of the sciatic nerve. The nerve starts in the lower back and runs down the back of each leg. The nerve controls the muscles in the lower leg and in the back of the knee, while also providing sensation to the back of the thigh, lower leg, and the sole of your foot. Sciatica is a symptom of another medical condition. For instance, nerve damage or certain conditions, such as a herniated disk or bone spur on the spine, pinch or put pressure on the sciatic nerve. This causes the pain, weakness, or other sensations normally associated with sciatica. Generally, sciatica only affects one side of the body. CAUSES   Herniated or slipped disc.  Degenerative disk disease.  A pain disorder involving the narrow muscle in the buttocks (piriformis syndrome).  Pelvic injury or fracture.  Pregnancy.  Tumor (rare). SYMPTOMS  Symptoms can vary from mild to very severe. The symptoms usually travel from the low back to the buttocks and down the back of the leg. Symptoms can include:  Mild tingling or dull aches in the lower back, leg, or hip.  Numbness in the back of the calf or sole of the foot.  Burning sensations in the lower back, leg, or hip.  Sharp pains in the lower back, leg, or hip.  Leg weakness.  Severe back pain inhibiting movement. These symptoms may get worse with coughing, sneezing, laughing, or prolonged sitting or standing. Also, being overweight may worsen symptoms. DIAGNOSIS  Your caregiver will perform a physical exam to look for common symptoms of sciatica. He or she may ask you to do certain movements or activities that would trigger sciatic nerve pain. Other tests may be performed to find the cause of the sciatica. These may include:  Blood tests.  X-rays.  Imaging tests, such as an MRI or CT scan. TREATMENT  Treatment is directed at the cause of the sciatic pain. Sometimes, treatment is not necessary  and the pain and discomfort goes away on its own. If treatment is needed, your caregiver may suggest:  Over-the-counter medicines to relieve pain.  Prescription medicines, such as anti-inflammatory medicine, muscle relaxants, or narcotics.  Applying heat or ice to the painful area.  Steroid injections to lessen pain, irritation, and inflammation around the nerve.  Reducing activity during periods of pain.  Exercising and stretching to strengthen your abdomen and improve flexibility of your spine. Your caregiver may suggest losing weight if the extra weight makes the back pain worse.  Physical therapy.  Surgery to eliminate what is pressing or pinching the nerve, such as a bone spur or part of a herniated disk. HOME CARE INSTRUCTIONS   Only take over-the-counter or prescription medicines for pain or discomfort as directed by your caregiver.  Apply ice to the affected area for 20 minutes, 3-4 times a day for the first 48-72 hours. Then try heat in the same way.  Exercise, stretch, or perform your usual activities if these do not aggravate your pain.  Attend physical therapy sessions as directed by your caregiver.  Keep all follow-up appointments as directed by your caregiver.  Do not wear high heels or shoes that do not provide proper support.  Check your mattress to see if it is too soft. A firm mattress may lessen your pain and discomfort. SEEK IMMEDIATE MEDICAL CARE IF:   You lose control of your bowel or bladder (incontinence).  You have increasing weakness in the lower back, pelvis, buttocks,   or legs.  You have redness or swelling of your back.  You have a burning sensation when you urinate.  You have pain that gets worse when you lie down or awakens you at night.  Your pain is worse than you have experienced in the past.  Your pain is lasting longer than 4 weeks.  You are suddenly losing weight without reason. MAKE SURE YOU:  Understand these  instructions.  Will watch your condition.  Will get help right away if you are not doing well or get worse. Document Released: 07/30/2001 Document Revised: 02/04/2012 Document Reviewed: 12/15/2011 Exit[employer] Patient Information 2015 [employer]. This information is not intended to replace advice given to you by your health care provider. Make sure you discuss any questions you have with your health care provider.  

## 2014-04-05 ENCOUNTER — Encounter: Payer: BC Managed Care – PPO | Admitting: Emergency Medicine

## 2014-05-14 ENCOUNTER — Encounter: Payer: Self-pay | Admitting: Internal Medicine

## 2014-05-24 ENCOUNTER — Ambulatory Visit (INDEPENDENT_AMBULATORY_CARE_PROVIDER_SITE_OTHER): Payer: BC Managed Care – PPO | Admitting: Emergency Medicine

## 2014-05-24 ENCOUNTER — Encounter: Payer: Self-pay | Admitting: Emergency Medicine

## 2014-05-24 VITALS — BP 145/76 | HR 67 | Temp 97.6°F | Resp 16 | Ht 67.0 in | Wt 283.0 lb

## 2014-05-24 DIAGNOSIS — E119 Type 2 diabetes mellitus without complications: Secondary | ICD-10-CM

## 2014-05-24 DIAGNOSIS — Z23 Encounter for immunization: Secondary | ICD-10-CM

## 2014-05-24 DIAGNOSIS — Z0000 Encounter for general adult medical examination without abnormal findings: Secondary | ICD-10-CM

## 2014-05-24 DIAGNOSIS — R9431 Abnormal electrocardiogram [ECG] [EKG]: Secondary | ICD-10-CM

## 2014-05-24 DIAGNOSIS — G4733 Obstructive sleep apnea (adult) (pediatric): Secondary | ICD-10-CM

## 2014-05-24 HISTORY — DX: Type 2 diabetes mellitus without complications: E11.9

## 2014-05-24 LAB — COMPLETE METABOLIC PANEL WITH GFR
ALT: 24 U/L (ref 0–53)
AST: 19 U/L (ref 0–37)
Albumin: 4.1 g/dL (ref 3.5–5.2)
Alkaline Phosphatase: 61 U/L (ref 39–117)
BILIRUBIN TOTAL: 0.5 mg/dL (ref 0.2–1.2)
BUN: 17 mg/dL (ref 6–23)
CALCIUM: 9.2 mg/dL (ref 8.4–10.5)
CHLORIDE: 104 meq/L (ref 96–112)
CO2: 26 mEq/L (ref 19–32)
CREATININE: 1.02 mg/dL (ref 0.50–1.35)
GFR, Est African American: >89 mL/min
GFR, Est Non African American: 78 mL/min
Glucose, Bld: 136 mg/dL — ABNORMAL HIGH (ref 70–99)
Potassium: 4.1 mEq/L (ref 3.5–5.3)
Sodium: 141 mEq/L (ref 135–145)
Total Protein: 7.1 g/dL (ref 6.0–8.3)

## 2014-05-24 LAB — POCT URINALYSIS DIPSTICK
Bilirubin, UA: NEGATIVE
Blood, UA: NEGATIVE
Glucose, UA: NEGATIVE
KETONES UA: NEGATIVE
LEUKOCYTES UA: NEGATIVE
Nitrite, UA: NEGATIVE
PH UA: 6
PROTEIN UA: NEGATIVE
Spec Grav, UA: 1.015
UROBILINOGEN UA: 0.2

## 2014-05-24 LAB — LIPID PANEL
Cholesterol: 124 mg/dL (ref 0–200)
HDL: 33 mg/dL — ABNORMAL LOW (ref >39)
LDL CALC: 49 mg/dL (ref 0–99)
Total CHOL/HDL Ratio: 3.8 Ratio
Triglycerides: 211 mg/dL — ABNORMAL HIGH (ref <150)
VLDL: 42 mg/dL — ABNORMAL HIGH (ref 0–40)

## 2014-05-24 LAB — POCT GLYCOSYLATED HEMOGLOBIN (HGB A1C): Hemoglobin A1C: 6.3

## 2014-05-24 LAB — T4, FREE: Free T4: 0.84 ng/dL (ref 0.80–1.80)

## 2014-05-24 LAB — IFOBT (OCCULT BLOOD): IMMUNOLOGICAL FECAL OCCULT BLOOD TEST: NEGATIVE

## 2014-05-24 LAB — TSH: TSH: 2.995 u[IU]/mL (ref 0.350–4.500)

## 2014-05-24 MED ORDER — ZOSTER VACCINE LIVE 19400 UNT/0.65ML ~~LOC~~ SOLR: 0.6500 mL | Freq: Once | SUBCUTANEOUS | Status: DC

## 2014-05-24 NOTE — Patient Instructions (Signed)
Influenza Vaccine (Flu Vaccine, Inactivated or Recombinant) 2014-2015: What You Need to Know 1. Why get vaccinated? Influenza ("flu") is a contagious disease that spreads around the [place] every winter, usually between October and May. Flu is caused by influenza viruses, and is spread mainly by coughing, sneezing, and close contact. Anyone can get flu, but the risk of getting flu is highest among children. Symptoms come on suddenly and may last several days. They can include:  fever/chills  sore throat  muscle aches  fatigue  cough  headache  runny or stuffy nose Flu can make some people much sicker than others. These people include young children, people 65 and older, pregnant women, and people with certain health conditions-such as heart, lung or kidney disease, nervous system disorders, or a weakened immune system. Flu vaccination is especially important for these people, and anyone in close contact with them. Flu can also lead to pneumonia, and make existing medical conditions worse. It can cause diarrhea and seizures in children. Each year thousands of people in the [place] die from flu, and many more are hospitalized. Flu vaccine is the best protection against flu and its complications. Flu vaccine also helps prevent spreading flu from person to person. 2. Inactivated and recombinant flu vaccines You are getting an injectable flu vaccine, which is either an "inactivated" or "recombinant" vaccine. These vaccines do not contain any live influenza virus. They are given by injection with a needle, and often called the "flu shot."  A different live, attenuated (weakened) influenza vaccine is sprayed into the nostrils. This vaccine is described in a separate Vaccine Information Statement. Flu vaccination is recommended every year. Some children 6 months through 8 years of age might need two doses during one year. Flu viruses are always changing. Each year's flu vaccine is made  to protect against 3 or 4 viruses that are likely to cause disease that year. Flu vaccine cannot prevent all cases of flu, but it is the best defense against the disease.  It takes about 2 weeks for protection to develop after the vaccination, and protection lasts several months to a year. Some illnesses that are not caused by influenza virus are often mistaken for flu. Flu vaccine will not prevent these illnesses. It can only prevent influenza. Some inactivated flu vaccine contains a very small amount of a mercury-based preservative called thimerosal. Studies have shown that thimerosal in vaccines is not harmful, but flu vaccines that do not contain a preservative are available. 3. Some people should not get this vaccine Tell the person who gives you the vaccine:  If you have any severe, life-threatening allergies. If you ever had a life-threatening allergic reaction after a dose of flu vaccine, or have a severe allergy to any part of this vaccine, including (for example) an allergy to gelatin, antibiotics, or eggs, you may be advised not to get vaccinated. Most, but not all, types of flu vaccine contain a small amount of egg protein.  If you ever had Guillain-Barr Syndrome (a severe paralyzing illness, also called GBS). Some people with a history of GBS should not get this vaccine. This should be discussed with your doctor.  If you are not feeling well. It is usually okay to get flu vaccine when you have a mild illness, but you might be advised to wait until you feel better. You should come back when you are better. 4. Risks of a vaccine reaction With a vaccine, like any medicine, there is a chance of side   effects. These are usually mild and go away on their own. Problems that could happen after any vaccine:  Brief fainting spells can happen after any medical procedure, including vaccination. Sitting or lying down for about 15 minutes can help prevent fainting, and injuries caused by a fall. Tell  your doctor if you feel dizzy, or have vision changes or ringing in the ears.  Severe shoulder pain and reduced range of motion in the arm where a shot was given can happen, very rarely, after a vaccination.  Severe allergic reactions from a vaccine are very rare, estimated at less than 1 in a million doses. If one were to occur, it would usually be within a few minutes to a few hours after the vaccination. Mild problems following inactivated flu vaccine:  soreness, redness, or swelling where the shot was given  hoarseness  sore, red or itchy eyes  cough  fever  aches  headache  itching  fatigue If these problems occur, they usually begin soon after the shot and last 1 or 2 days. Moderate problems following inactivated flu vaccine:  Young children who get inactivated flu vaccine and pneumococcal vaccine (PCV13) at the same time may be at increased risk for seizures caused by fever. Ask your doctor for more information. Tell your doctor if a child who is getting flu vaccine has ever had a seizure. Inactivated flu vaccine does not contain live flu virus, so you cannot get the flu from this vaccine. As with any medicine, there is a very remote chance of a vaccine causing a serious injury or death. The safety of vaccines is always being monitored. For more information, visit: www.cdc.gov/vaccinesafety/ 5. What if there is a serious reaction? What should I look for?  Look for anything that concerns you, such as signs of a severe allergic reaction, very high fever, or behavior changes. Signs of a severe allergic reaction can include hives, swelling of the face and throat, difficulty breathing, a fast heartbeat, dizziness, and weakness. These would start a few minutes to a few hours after the vaccination. What should I do?  If you think it is a severe allergic reaction or other emergency that can't wait, call 9-1-1 and get the person to the nearest hospital. Otherwise, call your  doctor.  Afterward, the reaction should be reported to the Vaccine Adverse Event Reporting System (VAERS). Your doctor should file this report, or you can do it yourself through the VAERS web site at www.vaers.hhs.gov, or by calling 1-800-822-7967. VAERS does not give medical advice. 6. The National Vaccine Injury Compensation Program The National Vaccine Injury Compensation Program (VICP) is a federal program that was created to compensate people who may have been injured by certain vaccines. Persons who believe they may have been injured by a vaccine can learn about the program and about filing a claim by calling 1-800-338-2382 or visiting the VICP website at www.hrsa.gov/vaccinecompensation. There is a time limit to file a claim for compensation. 7. How can I learn more?  Ask your health care provider.  Call your local or state health department.  Contact the Centers for Disease Control and Prevention (CDC):  Call 1-800-232-4636 (1-800-CDC-INFO) or  Visit CDC's website at www.cdc.gov/flu CDC Vaccine Information Statement (Interim) Inactivated Influenza Vaccine (04/06/2013) Document Released: 05/30/2006 Document Revised: 12/20/2013 Document Reviewed: 07/23/2013 ExitCare Patient Information 2015 [employer]. This information is not intended to replace advice given to you by your health care provider. Make sure you discuss any questions you have with your health   care provider.  

## 2014-05-24 NOTE — Progress Notes (Signed)
   Subjective:    Patient ID: Rodney Turner, male    DOB: 1951/05/14, 63 y.o.   MRN: [id number] This chart was scribed for Arlyss Queen, MD by Zola Button, Medical Scribe. This patient was seen in room 21 and the patient's care was started at 3:37 PM.   HPI HPI Comments: Rodney Turner is a 63 y.o. male who presents to the [facility] for an annual exam. Patient has no specific complaints. He is UTD on his colonoscopies. He is here to discuss his weight problem.    Review of Systems  All other systems reviewed and are negative. Hx of obstructive sleep apnea followed by his pulmonologist     Objective:   Physical Exam CONSTITUTIONAL: Morbidly obese HEAD: Normocephalic/atraumatic EYES: EOM/PERRL ENMT: Mucous membranes moist NECK: supple no meningeal signs SPINE: entire spine nontender CV: S1/S2 noted, no murmurs/rubs/gallops noted LUNGS: Lungs are clear to auscultation bilaterally, no apparent distress ABDOMEN: soft, nontender, no rebound or guarding GU: no cva tenderness NEURO: Pt is awake/alert, moves all extremitiesx4 EXTREMITIES: pulses normal, full ROM, degenerative changes in both knees SKIN: warm, color normal PSYCH: no abnormalities of mood noted Results for orders placed in visit on 05/24/14  POCT URINALYSIS DIPSTICK      Result Value Ref Range   Color, UA yellow     Clarity, UA clear     Glucose, UA neg     Bilirubin, UA neg     Ketones, UA neg     Spec Grav, UA 1.015     Blood, UA neg     pH, UA 6.0     Protein, UA neg     Urobilinogen, UA 0.2     Nitrite, UA neg     Leukocytes, UA Negative    POCT GLYCOSYLATED HEMOGLOBIN (HGB A1C)      Result Value Ref Range   Hemoglobin A1C 6.3    IFOBT (OCCULT BLOOD)      Result Value Ref Range   IFOBT Negative           Assessment & Plan:  Routine labs were done today. I advised him to start an exercise program to be followed with a specific diet, such as Weight Watchers or Nutrisystem. He  was given a flu shot today. He is UTD on TDAP. Prescription given for shingles vaccine. Hemoglobin A1c is now 6.3. I told him this is diabetic range I would give him 3-4 months to get it down with weight loss and exercise . I personally performed the services described in this documentation, which was scribed in my presence. The recorded information has been reviewed and is accurate.

## 2014-05-24 NOTE — Addendum Note (Signed)
Addended by: Arlyss Queen A on: 05/24/2014 06:02 PM   Modules accepted: Orders

## 2014-05-25 LAB — PSA: PSA: 0.24 ng/mL (ref <=4.00)

## 2014-06-03 ENCOUNTER — Encounter: Payer: Self-pay | Admitting: *Deleted

## 2014-06-03 ENCOUNTER — Other Ambulatory Visit: Payer: Self-pay | Admitting: *Deleted

## 2014-06-06 ENCOUNTER — Ambulatory Visit (INDEPENDENT_AMBULATORY_CARE_PROVIDER_SITE_OTHER): Payer: BC Managed Care – PPO | Admitting: Cardiology

## 2014-06-06 ENCOUNTER — Encounter: Payer: Self-pay | Admitting: Cardiology

## 2014-06-06 VITALS — BP 122/70 | HR 60 | Ht 67.0 in | Wt 284.0 lb

## 2014-06-06 DIAGNOSIS — E785 Hyperlipidemia, unspecified: Secondary | ICD-10-CM

## 2014-06-06 DIAGNOSIS — I447 Left bundle-branch block, unspecified: Secondary | ICD-10-CM

## 2014-06-06 NOTE — Patient Instructions (Addendum)
Your physician recommends that you continue on your current medications as directed. Please refer to the Current Medication list given to you today.  Your physician has requested that you have a lexiscan myoview. For further information please visit [email or web address]. Please follow instruction sheet, as given.  Your physician recommends that you schedule a follow-up appointment after lexiscan  

## 2014-06-06 NOTE — Progress Notes (Signed)
Patient ID: Rodney Turner, male   DOB: [birth year], 63 y.o.   MRN: [id number]     Patient Name: Rodney Turner Date of Encounter: 06/06/2014  Primary Care Provider:  Jenny Reichmann, MD Primary Cardiologist:  Dorothy Spark   Problem List   Past Medical History  Diagnosis Date  . Allergic rhinitis   . History of kidney stones 1990's  . Diabetes mellitus 05/24/2014  . Morbid obesity 03/17/2014  . OBSTRUCTIVE SLEEP APNEA 02/22/2008    HST 2013:  AHI 51/hr.  Optimal pressure 12cm on autotitration.    . OSA on CPAP    Past Surgical History  Procedure Laterality Date  . Knee surgery      left    Allergies  Allergies  Allergen Reactions  . Codeine     REACTION: nausea    HPI  63 year old male with morbid obesity, hypertension and newly diagnosed diabetes who is being referred by his PCP for an abnormal ECG. The patient has a sedentary job and eats a lot of fast food. He is not experiencing any chest pain but used to exercised a lot in the past. He stopped about two years ago along with poor eating habits and gained a lot of weight. He started to walk again and is only experiencing dyspnea at the high level of exertion. No palpitations, no syncope. No LE edema. He has never smoked. No FH of SCD. He had a normal adenosine stress test 5 years ago.   Home Medications  Prior to Admission medications   Medication Sig Start Date End Date Taking? Authorizing Provider  aspirin 81 MG tablet Take 81 mg by mouth daily.   Yes Historical Provider, MD  benzonatate (TESSALON) 200 MG capsule Take 1 capsule (200 mg total) by mouth 3 (three) times daily as needed for cough. 07/10/12  Yes Eleanore Kurtis Bushman, PA-C  dextromethorphan (DELSYM) 30 MG/5ML liquid Take 15 mg by mouth as needed for cough.   Yes Historical Provider, MD  fluticasone (FLONASE) 50 MCG/ACT nasal spray Place 2 sprays into both nostrils daily. 07/07/13  Yes Thao P Le, DO  guaiFENesin (MUCINEX) 600 MG 12 hr tablet Take 600 mg  by mouth daily.    Yes Historical Provider, MD  ibuprofen (ADVIL,MOTRIN) 100 MG tablet Take 100 mg by mouth as needed.   Yes Historical Provider, MD  zoster vaccine live, PF, (ZOSTAVAX) 88416 UNT/0.65ML injection Inject 19,400 Units into the skin once. 05/24/14   Darlyne Russian, MD   Family History  Family History  Problem Relation Age of Onset  . Snoring Father   . Esophageal varices Father   . Diabetes Paternal Grandmother   . Heart disease Mother   . Gallstones Mother    Social History  History   Social History  . Marital Status: Married    Spouse Name: N/A    Number of Children: N/A  . Years of Education: N/A   Occupational History  . sales    Social History Main Topics  . Smoking status: Never Smoker   . Smokeless tobacco: Never Used  . Alcohol Use: No  . Drug Use: No  . Sexual Activity: Yes    Birth Control/ Protection: None   Other Topics Concern  . Not on file   Social History Narrative  . No narrative on file     Review of Systems, as per HPI, otherwise negative General:  No chills, fever, night sweats or weight changes.  Cardiovascular:  No chest pain, dyspnea on exertion, edema, orthopnea, palpitations, paroxysmal nocturnal dyspnea. Dermatological: No rash, lesions/masses Respiratory: No cough, dyspnea Urologic: No hematuria, dysuria Abdominal:   No nausea, vomiting, diarrhea, bright red blood per rectum, melena, or hematemesis Neurologic:  No visual changes, wkns, changes in mental status. All other systems reviewed and are otherwise negative except as noted above.  Physical Exam  Blood pressure 122/70, pulse 60, height 5\' 7"  (1.702 m), weight 284 lb (128.822 kg), SpO2 99.00%.  General: Pleasant, NAD, obese, significant abdominal obesity Psych: Normal affect. Neuro: Alert and oriented X 3. Moves all extremities spontaneously. HEENT: Normal  Neck: Supple without bruits or JVD. Lungs:  Resp regular and unlabored, CTA. Heart: RRR no s3, s4, or  murmurs. Abdomen: Soft, non-tender, non-distended, BS + x 4.  Extremities: No clubbing, cyanosis or edema. DP/PT/Radials 2+ and equal bilaterally.  Labs:     Component Value Date/Time   NA 141 05/24/2014 1551   K 4.1 05/24/2014 1551   CL 104 05/24/2014 1551   CO2 26 05/24/2014 1551   GLUCOSE 136* 05/24/2014 1551   BUN 17 05/24/2014 1551   CREATININE 1.02 05/24/2014 1551   CALCIUM 9.2 05/24/2014 1551   PROT 7.1 05/24/2014 1551   ALBUMIN 4.1 05/24/2014 1551   AST 19 05/24/2014 1551   ALT 24 05/24/2014 1551   ALKPHOS 61 05/24/2014 1551   BILITOT 0.5 05/24/2014 1551   GFRNONAA 78 05/24/2014 1551   GFRAA >89 05/24/2014 1551   Lab Results  Component Value Date   CHOL 124 05/24/2014   HDL 33* 05/24/2014   LDLCALC 49 05/24/2014   TRIG 211* 05/24/2014    Accessory Clinical Findings  Echocardiogram - 2009 LEFT VENTRICLE: - Left ventricular size was normal. - Overall left ventricular systolic function was normal. - Left ventricular ejection fraction was estimated , range being 55 % to 65 %. - There were no left ventricular regional wall motion abnormalities. - Left ventricular wall thickness was normal. - There was mild focal basal septal hypertrophy.  Doppler interpretation(s): - Doppler parameters were consistent with abnormal left ventricular relaxation.  AORTIC VALVE: - The aortic valve was trileaflet. - Aortic valve thickness was normal. - There was normal aortic valve leaflet excursion.  Doppler interpretation(s): - Transaortic velocity was within the normal range. - There was no evidence for aortic valve stenosis. - There was trivial aortic valvular regurgitation.  AORTA: - The aortic root was normal in size.  MITRAL VALVE: - Mitral valve structure was normal. - There was normal mitral valve leaflet excursion.  Doppler interpretation(s): - The transmitral velocity was within the normal range. - There was no evidence for mitral stenosis. - There was trivial mitral  valvular regurgitation.  LEFT ATRIUM: - Left atrial size was normal.  RIGHT VENTRICLE: - Right ventricular size was normal. - Right ventricular systolic function was normal.  PULMONIC VALVE: - The structure of the pulmonic valve appeared to be normal.  Doppler interpretation(s): - The transpulmonic velocity was within the normal range. - There was no pulmonic valve stenosis. - There was trivial pulmonic regurgitation.  TRICUSPID VALVE: - The tricuspid valve structure was normal. - Tricuspid leaflet excursion was normal.  Doppler interpretation(s): - The transtricuspid velocity was within the normal range. - There was no evidence for tricuspid stenosis. - There was no significant tricuspid valvular regurgitation.  PULMONARY ARTERY: - The pulmonary artery morphology appeared normal.  RIGHT ATRIUM: - Right atrial size was normal.  ECG - SR, LBBB   Assessment &  Plan  63 year old male  1. New dg of LBBB - i don't have the old ECG, but assume there was no LBBB as his stress test was started as an exercise test and converted to adenosine. He has risk factors for CAD including HLP, new dg of DM. I will order a Lexiscan nuclear stress test.  2. BP - controlled  3. HLP - isolated TAG elevated - diet and exercise for now.  Follow up after the stress test.  Dorothy Spark, MD, [facility] 06/06/2014, 9:52 AM

## 2014-06-14 ENCOUNTER — Encounter: Payer: Self-pay | Admitting: Cardiovascular Disease

## 2014-06-15 ENCOUNTER — Ambulatory Visit (HOSPITAL_COMMUNITY): Payer: BC Managed Care – PPO | Attending: Cardiology | Admitting: Radiology

## 2014-06-15 VITALS — BP 108/62 | Ht 67.0 in | Wt 280.0 lb

## 2014-06-15 DIAGNOSIS — R5383 Other fatigue: Secondary | ICD-10-CM | POA: Diagnosis not present

## 2014-06-15 DIAGNOSIS — E119 Type 2 diabetes mellitus without complications: Secondary | ICD-10-CM | POA: Diagnosis not present

## 2014-06-15 DIAGNOSIS — I447 Left bundle-branch block, unspecified: Secondary | ICD-10-CM

## 2014-06-15 MED ORDER — REGADENOSON 0.4 MG/5ML IV SOLN
0.4000 mg | Freq: Once | INTRAVENOUS | Status: AC
  Administered 2014-06-15: 0.4 mg via INTRAVENOUS

## 2014-06-15 MED ORDER — TECHNETIUM TC 99M SESTAMIBI GENERIC - CARDIOLITE
33.0000 | Freq: Once | INTRAVENOUS | Status: AC | PRN
  Administered 2014-06-15: 33 via INTRAVENOUS

## 2014-06-15 NOTE — Progress Notes (Signed)
Rodney Turner [address] [place], [place] [address] [phone]    Cardiology Nuclear Med Study  SKYLEN SPIERING is a 63 y.o. male     MRN : [id number]     DOB: [birth year]  Procedure Date: 06/15/2014  Nuclear Med Background Indication for Stress Test:  Evaluation for Ischemia History:  '09 MPI: NL EF: 72% Cardiac Risk Factors: NIDDM  Symptoms:  Fatigue   Nuclear Pre-Procedure Caffeine/Decaff Intake:  None NPO After: 6 pm   Lungs:  clear O2 Sat: 97% on room air. IV 0.9% NS with Angio Cath:  22g  IV Site: R Hand  IV Started by:  Crissie Figures, RN  Chest Size (in):  50 Cup Size: n/a  Height: 5\' 7"  (1.702 m)  Weight:  280 lb (127.007 kg)  BMI:  Body mass index is 43.84 kg/(m^2). Tech Comments:  Took ASA this am    Nuclear Med Study 1 or 2 day study: 2 day  Stress Test Type:  Carlton Adam  Reading MD: n/a  Order Authorizing Provider:  K.Nelson MD  Resting Radionuclide: Technetium 83m Sestamibi  Resting Radionuclide Dose: 33.0 mCi on 06/17/14   Stress Radionuclide:  Technetium 59m Sestamibi  Stress Radionuclide Dose: 33.0 mCi on 06/15/14           Stress Protocol Rest HR: 60 Stress HR: 80  Rest BP: 108/62 Stress BP: 125/46  Exercise Time (min): n/a METS: n/a   Predicted Max HR: 157 bpm % Max HR: 50.96 bpm Rate Pressure Product: 10000   Dose of Adenosine (mg):  n/a Dose of Lexiscan: 0.4 mg  Dose of Atropine (mg): n/a Dose of Dobutamine: n/a mcg/kg/min (at max HR)  Stress Test Technologist: Perrin Maltese, EMT-P  Nuclear Technologist:  Earl Many, CNMT     Rest Procedure:  Myocardial perfusion imaging was performed at rest 45 minutes following the intravenous administration of Technetium 66m Sestamibi. Rest ECG: Normal sinus rhythm. Decreased anterior R-wave progression.  Stress Procedure:  The patient received IV Lexiscan 0.4 mg over 15-seconds.  Technetium 11m Sestamibi injected at 30-seconds. This patient had sob with the Lexiscan  injection. Quantitative spect images were obtained after a 45 minute delay. Stress ECG: No significant change from baseline ECG  QPS Raw Data Images:  Normal; no motion artifact; normal heart/lung ratio. Stress Images:  Medium-sized area of mild/moderate decreased uptake affecting the apical, apical inferior segment, apical anterior segment, and the apical lateral segment. Rest Images:  Small area of mild/moderate decreased uptake affecting the apical And the apical anterior segment. Subtraction (SDS):  There is slight reversibility in the apical lateral segment and the apical inferior segment. Transient Ischemic Dilatation (Normal <1.22):  0.95 Lung/Heart Ratio (Normal <0.45):  0.36  Quantitative Gated Spect Images QGS EDV:  94 ml QGS ESV:  37 ml  Impression Exercise Capacity:  Lexiscan with no exercise. BP Response:  Normal blood pressure response. Clinical Symptoms:  Shortness of breath ECG Impression:  No significant ST segment change suggestive of ischemia. Comparison with Prior Nuclear Study: The study is compared with the report of the study from July, 2009.  Overall Impression:  The study is abnormal. However this is a low risk scan. I cannot directly compare the study images with the study of 2009. There might be a slight change. There is question of either diaphragmatic attenuation or a small area of scar near the inferior apex. There may be slight peri-infarct ischemia. (In 2009, there was no mention of slight  ischemia.).  LV Ejection Fraction: 61%.  LV Wall Motion:  Wall motion appears normal.  Dola Argyle , MD

## 2014-06-17 ENCOUNTER — Ambulatory Visit (HOSPITAL_COMMUNITY): Payer: BC Managed Care – PPO | Attending: Cardiology

## 2014-06-17 DIAGNOSIS — R0989 Other specified symptoms and signs involving the circulatory and respiratory systems: Secondary | ICD-10-CM

## 2014-06-17 MED ORDER — TECHNETIUM TC 99M SESTAMIBI GENERIC - CARDIOLITE
33.0000 | Freq: Once | INTRAVENOUS | Status: AC | PRN
  Administered 2014-06-17: 33 via INTRAVENOUS

## 2014-06-22 ENCOUNTER — Telehealth: Payer: Self-pay | Admitting: Cardiology

## 2014-06-22 NOTE — Telephone Encounter (Signed)
New message         Pt would like to know if he still needs to come on Monday 11/9 / pt already received results

## 2014-06-22 NOTE — Telephone Encounter (Signed)
Informed the pt that he should keep his scheduled ov with Dr Meda Coffee on 11/9.  Informed the pt that this was part of his assessment and plan from last ov with Dr Meda Coffee.  Pt verbalized understanding and agrees with this plan.

## 2014-06-27 ENCOUNTER — Encounter: Payer: Self-pay | Admitting: Cardiology

## 2014-06-27 ENCOUNTER — Ambulatory Visit (INDEPENDENT_AMBULATORY_CARE_PROVIDER_SITE_OTHER): Payer: BC Managed Care – PPO | Admitting: Cardiology

## 2014-06-27 VITALS — BP 112/64 | HR 71 | Ht 67.0 in | Wt 285.0 lb

## 2014-06-27 DIAGNOSIS — R0609 Other forms of dyspnea: Secondary | ICD-10-CM

## 2014-06-27 DIAGNOSIS — E785 Hyperlipidemia, unspecified: Secondary | ICD-10-CM

## 2014-06-27 DIAGNOSIS — R0600 Dyspnea, unspecified: Secondary | ICD-10-CM

## 2014-06-27 DIAGNOSIS — E8881 Metabolic syndrome: Secondary | ICD-10-CM

## 2014-06-27 NOTE — Progress Notes (Signed)
Patient ID: EMERALD GEHRES, male   DOB: 11/25/1950, 63 y.o.   MRN: [id number]     Patient Name: Rodney Turner Date of Encounter: 06/27/2014  Primary Care Provider:  Jenny Reichmann, MD  Primary Cardiologist:  Dorothy Spark   Problem List   Past Medical History  Diagnosis Date  . Allergic rhinitis   . History of kidney stones 1990's  . Diabetes mellitus 05/24/2014  . Morbid obesity 03/17/2014  . OBSTRUCTIVE SLEEP APNEA 02/22/2008    HST 2013:  AHI 51/hr.  Optimal pressure 12cm on autotitration.    . OSA on CPAP    Past Surgical History  Procedure Laterality Date  . Knee surgery      left    Allergies  Allergies  Allergen Reactions  . Codeine Other (See Comments)    REACTION: nausea    HPI  63 year old male with morbid obesity, hypertension and newly diagnosed diabetes who is being referred by his PCP for an abnormal ECG. The patient has a sedentary job and eats a lot of fast food. He is not experiencing any chest pain but used to exercised a lot in the past. He stopped about two years ago along with poor eating habits and gained a lot of weight. He started to walk again and is only experiencing dyspnea at the high level of exertion. No palpitations, no syncope. No LE edema. He has never smoked. No FH of SCD. He had a normal adenosine stress test 5 years ago.   06/27/14 - 2 week follow up for tests results, no chest pain, stable DOE, admits to eating very unhealthy food - daily at fast food.   Home Medications  Prior to Admission medications   Medication Sig Start Date End Date Taking? Authorizing Provider  aspirin 81 MG tablet Take 81 mg by mouth daily.   Yes Historical Provider, MD  benzonatate (TESSALON) 200 MG capsule Take 1 capsule (200 mg total) by mouth 3 (three) times daily as needed for cough. 07/10/12  Yes Eleanore Kurtis Bushman, PA-C  dextromethorphan (DELSYM) 30 MG/5ML liquid Take 15 mg by mouth as needed for cough.   Yes Historical Provider, MD  fluticasone  (FLONASE) 50 MCG/ACT nasal spray Place 2 sprays into both nostrils daily. 07/07/13  Yes Thao P Le, DO  guaiFENesin (MUCINEX) 600 MG 12 hr tablet Take 600 mg by mouth daily.    Yes Historical Provider, MD  ibuprofen (ADVIL,MOTRIN) 100 MG tablet Take 100 mg by mouth as needed.   Yes Historical Provider, MD  zoster vaccine live, PF, (ZOSTAVAX) 53299 UNT/0.65ML injection Inject 19,400 Units into the skin once. 05/24/14   Darlyne Russian, MD   Family History  Family History  Problem Relation Age of Onset  . Snoring Father   . Esophageal varices Father   . Diabetes Paternal Grandmother   . Heart disease Mother   . Gallstones Mother    Social History  History   Social History  . Marital Status: Married    Spouse Name: N/A    Number of Children: N/A  . Years of Education: N/A   Occupational History  . sales    Social History Main Topics  . Smoking status: Never Smoker   . Smokeless tobacco: Never Used  . Alcohol Use: No  . Drug Use: No  . Sexual Activity: Yes    Birth Control/ Protection: None   Other Topics Concern  . Not on file   Social History Narrative  Review of Systems, as per HPI, otherwise negative General:  No chills, fever, night sweats or weight changes.  Cardiovascular:  No chest pain, dyspnea on exertion, edema, orthopnea, palpitations, paroxysmal nocturnal dyspnea. Dermatological: No rash, lesions/masses Respiratory: No cough, dyspnea Urologic: No hematuria, dysuria Abdominal:   No nausea, vomiting, diarrhea, bright red blood per rectum, melena, or hematemesis Neurologic:  No visual changes, wkns, changes in mental status. All other systems reviewed and are otherwise negative except as noted above.  Physical Exam  Blood pressure 112/64, pulse 71, height 5\' 7"  (1.702 m), weight 285 lb (129.275 kg).  General: Pleasant, NAD, obese, significant abdominal obesity Psych: Normal affect. Neuro: Alert and oriented X 3. Moves all extremities  spontaneously. HEENT: Normal  Neck: Supple without bruits or JVD. Lungs:  Resp regular and unlabored, CTA. Heart: RRR no s3, s4, or murmurs. Abdomen: Soft, non-tender, non-distended, BS + x 4.  Extremities: No clubbing, cyanosis or edema. DP/PT/Radials 2+ and equal bilaterally.  Labs:     Component Value Date/Time   NA 141 05/24/2014 1551   K 4.1 05/24/2014 1551   CL 104 05/24/2014 1551   CO2 26 05/24/2014 1551   GLUCOSE 136* 05/24/2014 1551   BUN 17 05/24/2014 1551   CREATININE 1.02 05/24/2014 1551   CALCIUM 9.2 05/24/2014 1551   PROT 7.1 05/24/2014 1551   ALBUMIN 4.1 05/24/2014 1551   AST 19 05/24/2014 1551   ALT 24 05/24/2014 1551   ALKPHOS 61 05/24/2014 1551   BILITOT 0.5 05/24/2014 1551   GFRNONAA 78 05/24/2014 1551   GFRAA >89 05/24/2014 1551   Lab Results  Component Value Date   CHOL 124 05/24/2014   HDL 33* 05/24/2014   LDLCALC 49 05/24/2014   TRIG 211* 05/24/2014    Accessory Clinical Findings  Echocardiogram - 2009 LEFT VENTRICLE: - Left ventricular size was normal. - Overall left ventricular systolic function was normal. - Left ventricular ejection fraction was estimated , range being 55 % to 65 %. - There were no left ventricular regional wall motion abnormalities. - Left ventricular wall thickness was normal. - There was mild focal basal septal hypertrophy.  Doppler interpretation(s): - Doppler parameters were consistent with abnormal left ventricular relaxation.  AORTIC VALVE: - The aortic valve was trileaflet. - Aortic valve thickness was normal. - There was normal aortic valve leaflet excursion.  Doppler interpretation(s): - Transaortic velocity was within the normal range. - There was no evidence for aortic valve stenosis. - There was trivial aortic valvular regurgitation.  AORTA: - The aortic root was normal in size.  MITRAL VALVE: - Mitral valve structure was normal. - There was normal mitral valve leaflet excursion.  Doppler  interpretation(s): - The transmitral velocity was within the normal range. - There was no evidence for mitral stenosis. - There was trivial mitral valvular regurgitation.  LEFT ATRIUM: - Left atrial size was normal.  RIGHT VENTRICLE: - Right ventricular size was normal. - Right ventricular systolic function was normal.  PULMONIC VALVE: - The structure of the pulmonic valve appeared to be normal.  Doppler interpretation(s): - The transpulmonic velocity was within the normal range. - There was no pulmonic valve stenosis. - There was trivial pulmonic regurgitation.  TRICUSPID VALVE: - The tricuspid valve structure was normal. - Tricuspid leaflet excursion was normal.  Doppler interpretation(s): - The transtricuspid velocity was within the normal range. - There was no evidence for tricuspid stenosis. - There was no significant tricuspid valvular regurgitation.  PULMONARY ARTERY: - The pulmonary artery morphology appeared  normal.  RIGHT ATRIUM: - Right atrial size was normal.  ECG - SR, LBBB  Lexiscan nuclear stress test: 06/15/14 Quantitative Gated Spect Images QGS EDV: 94 ml QGS ESV: 37 ml  Impression Exercise Capacity: Lexiscan with no exercise. BP Response: Normal blood pressure response. Clinical Symptoms: Shortness of breath ECG Impression: No significant ST segment change suggestive of ischemia. Comparison with Prior Nuclear Study: The study is compared with the report of the study from July, 2009.  Overall Impression: The study is abnormal. However this is a low risk scan. I cannot directly compare the study images with the study of 2009. There might be a slight change. There is question of either diaphragmatic attenuation or a small area of scar near the inferior apex. There may be slight peri-infarct ischemia. (In 2009, there was no mention of slight ischemia.).  LV Ejection Fraction: 61%. LV Wall Motion: Wall motion appears normal.  Dola Argyle ,  MD    Assessment & Plan  63 year old male  1. New dg of LBBB - i don't have the old ECG, but assume there was no LBBB as his stress test was started as an exercise test and converted to adenosine. He has risk factors for CAD including HLP, new dg of DM. I will order a Lexiscan nuclear stress test. Negative stress test, no scar, no ischemia, normal LVEF.  2. BP - controlled  3. HLP - isolated TAG elevated - diet and exercise for now.  6. Metabolic syndrome - obesity, hyperlipidemia, elevated HbA1c - we discussed in details necessity of regular exercise and proper diet, we discussed details about diet. He seems to be motivated.   Follow up after the stress test.  Dorothy Spark, MD, [facility] 06/27/2014, 7:48 AM

## 2014-06-27 NOTE — Patient Instructions (Signed)
Your physician recommends that you continue on your current medications as directed. Please refer to the Current Medication list given to you today. Your physician wants you to follow-up in: 6 months with Dr. Nelson.  You will receive a reminder letter in the mail two months in advance. If you don't receive a letter, please call our office to schedule the follow-up appointment.  

## 2014-09-16 ENCOUNTER — Ambulatory Visit (INDEPENDENT_AMBULATORY_CARE_PROVIDER_SITE_OTHER): Payer: BLUE CROSS/BLUE SHIELD | Admitting: Pulmonary Disease

## 2014-09-16 ENCOUNTER — Encounter: Payer: Self-pay | Admitting: Pulmonary Disease

## 2014-09-16 VITALS — BP 122/64 | HR 64 | Temp 97.0°F | Ht 67.0 in | Wt 280.2 lb

## 2014-09-16 DIAGNOSIS — G4733 Obstructive sleep apnea (adult) (pediatric): Secondary | ICD-10-CM

## 2014-09-16 NOTE — Progress Notes (Signed)
   Subjective:    Patient ID: Rodney Turner, male    DOB: [birth year], 64 y.o.   MRN: [id number]  HPI The patient comes in today for follow-up of his obstructive sleep apnea. He is wearing C Pap compliantly by his download, and has excellent control of his AHI. He has a little bit of leak from his mask, but this does not bother him, nor does it affect his AHI. The patient is satisfied with his sleep and daytime alertness, and is even lost 7 pounds since his last visit.   Review of Systems  Constitutional: Negative for fever and unexpected weight change.  HENT: Negative for congestion, dental problem, ear pain, nosebleeds, postnasal drip, rhinorrhea, sinus pressure, sneezing, sore throat and trouble swallowing.   Eyes: Negative for redness and itching.  Respiratory: Negative for cough, chest tightness, shortness of breath and wheezing.   Cardiovascular: Negative for palpitations and leg swelling.  Gastrointestinal: Negative for nausea and vomiting.  Genitourinary: Negative for dysuria.  Musculoskeletal: Negative for joint swelling.  Skin: Negative for rash.  Neurological: Negative for headaches.  Hematological: Does not bruise/bleed easily.  Psychiatric/Behavioral: Negative for dysphoric mood. The patient is not nervous/anxious.        Objective:   Physical Exam Overweight male in no acute distress Nose without purulence or discharge noted Neck without lymphadenopathy or thyromegaly No skin breakdown or pressure necrosis from the C Pap mask Lower extremities with minimal edema, no cyanosis Alert and oriented, moves all 4 extremities.       Assessment & Plan:

## 2014-09-16 NOTE — Patient Instructions (Signed)
Continue on cpap, and keep up with mask changes and supplies. Keep working on weight loss followup with me again in one year if doing well.

## 2014-09-16 NOTE — Assessment & Plan Note (Signed)
The patient appears to be doing very well on his C Pap, and is satisfied with his quality of life at this point. He has lost 7 pounds since last visit, and I've encouraged him to continue doing so. He is to keep up with his mask changes and supplies.

## 2014-09-23 ENCOUNTER — Ambulatory Visit (INDEPENDENT_AMBULATORY_CARE_PROVIDER_SITE_OTHER): Payer: BLUE CROSS/BLUE SHIELD

## 2014-09-23 ENCOUNTER — Ambulatory Visit (INDEPENDENT_AMBULATORY_CARE_PROVIDER_SITE_OTHER): Payer: BLUE CROSS/BLUE SHIELD | Admitting: Emergency Medicine

## 2014-09-23 VITALS — BP 120/84 | HR 63 | Temp 97.5°F | Resp 16 | Ht 67.0 in | Wt 282.4 lb

## 2014-09-23 DIAGNOSIS — M79675 Pain in left toe(s): Secondary | ICD-10-CM

## 2014-09-23 LAB — POCT CBC
Granulocyte percent: 60.9 %G (ref 37–80)
HEMATOCRIT: 45.9 % (ref 43.5–53.7)
Hemoglobin: 15.2 g/dL (ref 14.1–18.1)
Lymph, poc: 3.2 (ref 0.6–3.4)
MCH, POC: 31.6 pg — AB (ref 27–31.2)
MCHC: 33.2 g/dL (ref 31.8–35.4)
MCV: 95 fL (ref 80–97)
MID (CBC): 0.8 (ref 0–0.9)
MPV: 8.3 fL (ref 0–99.8)
POC GRANULOCYTE: 6.2 (ref 2–6.9)
POC LYMPH %: 30.9 % (ref 10–50)
POC MID %: 8.2 %M (ref 0–12)
Platelet Count, POC: 244 10*3/uL (ref 142–424)
RBC: 4.83 M/uL (ref 4.69–6.13)
RDW, POC: 14.1 %
WBC: 10.2 10*3/uL (ref 4.6–10.2)

## 2014-09-23 LAB — URIC ACID: Uric Acid, Serum: 8 mg/dL — ABNORMAL HIGH (ref 4.0–7.8)

## 2014-09-23 MED ORDER — MELOXICAM 15 MG PO TABS: 15.0000 mg | ORAL_TABLET | Freq: Every day | ORAL | Status: DC

## 2014-09-23 NOTE — Progress Notes (Deleted)
   Subjective:    Patient ID: Rodney Turner, male    DOB: [birth year], 64 y.o.   MRN: [id number]  HPI    Review of Systems     Objective:   Physical Exam        Assessment & Plan:

## 2014-09-23 NOTE — Progress Notes (Addendum)
   Subjective:   This chart was scribed for Rodney Russian, MD, by Delphia Grates, ED Scribe. This patient was seen in room Room/bed 2 and the patient's care was started at 12:56 PM.   Patient ID: Rodney Turner, male    DOB: 11/22/1950, 64 y.o.   MRN: [id number]  HPI  HPI Comments: Rodney Turner is a 64 y.o. male, with history of LBBB, DM, and HLD, who presents to the [facility] complaining of worsening left foot pain near the left great toe with onset 4 days ago. Patient denies any prior injury or trauma of the left foot. There is associated swelling and redness to the area. He has applied ice with some relief of the swelling. Certain movements of the foot exacerbates the pain. Patient reports pain to the same area 2-3 years that he suspected was related to gout, however, this was ruled out after a clinical evaluation. He denies fever, chills, or any other injuries/pain.   Review of Systems  Constitutional: Negative for fever and chills.  Musculoskeletal: Positive for myalgias.  Skin: Positive for color change.       Objective:   Physical Exam  CONSTITUTIONAL: Well developed/well nourished HEAD: Normocephalic/atraumatic EYES: EOMI/PERRL ENMT: Mucous membranes moist NECK: supple no meningeal signs SPINE/BACK:entire spine nontender CV: S1/S2 noted, no murmurs/rubs/gallops noted LUNGS: Lungs are clear to auscultation bilaterally, no apparent distress ABDOMEN: soft, nontender, no rebound or guarding, bowel sounds noted throughout abdomen GU:no cva tenderness NEURO: Pt is awake/alert/appropriate, moves all extremitiesx4.  No facial droop.   EXTREMITIES: pulses normal/equal, full ROM.   Very tender over the ball of the foot on the left. There is no pain with flexion and extension of  the toe. There is mild redness over the first MTP of the left great toe. SKIN: warm, color normal PSYCH: no abnormalities of mood noted, alert and oriented to situation [facility]  reading (PRIMARY) by  Dr. Everlene Farrier no acute changes Results for orders placed or performed in visit on 09/23/14  POCT CBC  Result Value Ref Range   WBC 10.2 4.6 - 10.2 K/uL   Lymph, poc 3.2 0.6 - 3.4   POC LYMPH PERCENT 30.9 10 - 50 %L   MID (cbc) 0.8 0 - 0.9   POC MID % 8.2 0 - 12 %M   POC Granulocyte 6.2 2 - 6.9   Granulocyte percent 60.9 37 - 80 %G   RBC 4.83 4.69 - 6.13 M/uL   Hemoglobin 15.2 14.1 - 18.1 g/dL   HCT, POC 45.9 43.5 - 53.7 %   MCV 95.0 80 - 97 fL   MCH, POC 31.6 (A) 27 - 31.2 pg   MCHC 33.2 31.8 - 35.4 g/dL   RDW, POC 14.1 %   Platelet Count, POC 244 142 - 424 K/uL   MPV 8.3 0 - 99.8 fL       Assessment & Plan:   Will treat with Mobic 15 one a day. Will advise him to keep his leg elevated and apply ice. He was given a cookie to use under the metatarsal.I personally performed the services described in this documentation, which was scribed in my presence. The recorded information has been reviewed and is accurate.

## 2014-09-26 ENCOUNTER — Telehealth: Payer: Self-pay

## 2014-09-26 NOTE — Telephone Encounter (Signed)
Notes Recorded by Darlyne Russian, MD on 09/24/2014 at 10:18 AM Uric acid is elevated which is suspicious that this is gout. Me know how the anti-inflammatories do   Im not sure where to get pads for his feet. Please advise.

## 2014-09-26 NOTE — Telephone Encounter (Signed)
Fraser Din is calling for labs and to find out where he can get the pads for his feet.   [phone]

## 2014-09-26 NOTE — Telephone Encounter (Signed)
Per Dr. Perfecto Kingdom office notes, he GAVE the patient the pad (metatarsal cookie) when he was here for his visit.  If not, he should be able to get one at [facility].

## 2014-09-27 ENCOUNTER — Ambulatory Visit: Payer: BC Managed Care – PPO | Admitting: Emergency Medicine

## 2014-09-28 ENCOUNTER — Other Ambulatory Visit: Payer: Self-pay | Admitting: *Deleted

## 2014-09-28 ENCOUNTER — Telehealth: Payer: Self-pay | Admitting: *Deleted

## 2014-09-28 MED ORDER — PREDNISONE 10 MG PO KIT: PACK | ORAL | Status: DC

## 2014-09-28 NOTE — Telephone Encounter (Signed)
Please advise patient to stop his nonsteroidals and we can call in a week's worth of prednisone to see if that will help. Please call in a Sterapred DS 6 day Dosepak

## 2014-09-28 NOTE — Telephone Encounter (Signed)
Sent in Rx as instr'd and advised pt of Rx and instr'd to stop all NSAIDS while taking the prednisone. Pt voiced agreement. He will start in the morning bc he has already taken meloxicam today.

## 2014-09-28 NOTE — Telephone Encounter (Signed)
Spoke to pharmacy called in prescription for prednisone 10 mg kit.

## 2014-09-28 NOTE — Telephone Encounter (Signed)
Spoke with pt, advised message from [place]. Pt understood. I advised him of his lab results and he would like a medication for gout. He states his foot is still sore but its doing a little better. Please advise on what to send in.

## 2014-10-13 ENCOUNTER — Ambulatory Visit: Payer: BLUE CROSS/BLUE SHIELD | Admitting: Emergency Medicine

## 2014-12-26 ENCOUNTER — Ambulatory Visit: Payer: BLUE CROSS/BLUE SHIELD | Admitting: Cardiology

## 2015-01-12 ENCOUNTER — Ambulatory Visit (INDEPENDENT_AMBULATORY_CARE_PROVIDER_SITE_OTHER): Payer: BLUE CROSS/BLUE SHIELD

## 2015-01-12 ENCOUNTER — Ambulatory Visit (INDEPENDENT_AMBULATORY_CARE_PROVIDER_SITE_OTHER): Payer: BLUE CROSS/BLUE SHIELD | Admitting: Family Medicine

## 2015-01-12 VITALS — BP 126/72 | HR 58 | Temp 98.0°F | Resp 18 | Ht 68.0 in | Wt 280.0 lb

## 2015-01-12 DIAGNOSIS — E669 Obesity, unspecified: Secondary | ICD-10-CM | POA: Diagnosis not present

## 2015-01-12 DIAGNOSIS — M79672 Pain in left foot: Secondary | ICD-10-CM | POA: Diagnosis not present

## 2015-01-12 NOTE — Patient Instructions (Signed)
Use the walking boot as needed for the next couple of weeks. Let me know how this does for you.  In the meantime you can continue to use ibuprofen If your foot does not get well I will refer you to orthopedics Continue to work on weight loss- this will pay off for your long term health and foot pain!

## 2015-01-12 NOTE — Progress Notes (Signed)
[facility] [address], [place] [address] [phone]  Date:  01/12/2015   Name:  Rodney Turner   DOB:  Dec 19, 1950   MRN:  [id number]  PCP:  Jenny Reichmann, MD    Chief Complaint: Foot Pain   History of Present Illness:  Rodney Turner is a 64 y.o. very pleasant male patient who presents with the following:  He notes left foot pain for about one month. At first it was on the lateral aspect, and now it hurts more across the top of his foot.   He has been using ibuprofen 800 in the am and does ok during the day, but will have return of pain in the afternoon.  Walking a lot hurts more.    He did have gout back in February in his great toe- this caused him to alter his gait so he wonders if he might have strained his foot OW no known injury He has no noted any fever or other joint issues.  This does not seem like gout to him He recently flex to [place]- stood a lot at the airport over the weekend.  He noted 'a little puffiness every once in a while" in the evenring after prolonged standing.   Right now the foot does not hurt- he took ibuprofen this am  Patient Active Problem List   Diagnosis Date Noted  . Hyperlipidemia 06/06/2014  . LBBB (left bundle branch block) 06/06/2014  . Diabetes mellitus 05/24/2014  . Morbid obesity 03/17/2014  . Obstructive sleep apnea 02/22/2008    Past Medical History  Diagnosis Date  . Allergic rhinitis   . History of kidney stones 1990's  . Diabetes mellitus 05/24/2014  . Morbid obesity 03/17/2014  . OBSTRUCTIVE SLEEP APNEA 02/22/2008    HST 2013:  AHI 51/hr.  Optimal pressure 12cm on autotitration.    . OSA on CPAP     Past Surgical History  Procedure Laterality Date  . Knee surgery      left    History  Substance Use Topics  . Smoking status: Never Smoker   . Smokeless tobacco: Never Used  . Alcohol Use: No    Family History  Problem Relation Age of Onset  . Snoring Father   . Esophageal varices  Father   . Diabetes Paternal Grandmother   . Heart disease Mother   . Gallstones Mother     Allergies  Allergen Reactions  . Codeine Other (See Comments)    REACTION: nausea    Medication list has been reviewed and updated.  Current Outpatient Prescriptions on File Prior to Visit  Medication Sig Dispense Refill  . aspirin 81 MG tablet Take 81 mg by mouth daily.    . fluticasone (FLONASE) 50 MCG/ACT nasal spray Place 2 sprays into both nostrils daily. 16 g 11  . guaiFENesin (MUCINEX) 600 MG 12 hr tablet Take 1,200 mg by mouth 2 (two) times daily as needed for cough or to loosen phlegm.     Marland Kitchen ibuprofen (ADVIL,MOTRIN) 100 MG tablet Take 100 mg by mouth every 6 (six) hours as needed for pain.     Marland Kitchen dextromethorphan (DELSYM) 30 MG/5ML liquid Take 10 mg by mouth daily as needed for cough.     . meloxicam (MOBIC) 15 MG tablet Take 1 tablet (15 mg total) by mouth daily. (Patient not taking: Reported on 01/12/2015) 20 tablet 1  . PredniSONE 10 MG KIT Take 6 tablets once daily for 1 day,  then 5 tablets for 1 day, then 4 tabs for 1 day, then 3 tabs for 1 day, then 2 tabs for 1 day, then 1 tab for 1 day. (Patient not taking: Reported on 01/12/2015) 21 each 0  . zoster vaccine live, PF, (ZOSTAVAX) 20947 UNT/0.65ML injection Inject 19,400 Units into the skin once. (Patient not taking: Reported on 09/23/2014) 1 each 0   No current facility-administered medications on file prior to visit.    Review of Systems:  As per HPI- otherwise negative.   Physical Examination: Filed Vitals:   01/12/15 1017  BP: 126/72  Pulse: 58  Temp: 98 F (36.7 C)  Resp: 18   Filed Vitals:   01/12/15 1017  Height: 5' 8" (1.727 m)  Weight: 280 lb (127.007 kg)   Body mass index is 42.58 kg/(m^2). Ideal Body Weight: Weight in (lb) to have BMI = 25: 164.1  GEN: WDWN, NAD, Non-toxic, A & O x 3, obese, looks well HEENT: Atraumatic, Normocephalic. Neck supple. No masses, No LAD. Ears and Nose: No external  deformity. CV: RRR, No M/G/R. No JVD. No thrill. No extra heart sounds. PULM: CTA B, no wheezes, crackles, rhonchi. No retractions. No resp. distress. No accessory muscle use. EXTR: No c/c/e NEURO Normal gait.  PSYCH: Normally interactive. Conversant. Not depressed or anxious appearing.  Calm demeanor.  Left foot and ankle: ankle is negative.  Not able to reproduce tenderness of the foot.  Not tender at plantar fascia insertion No swelling, redness or bruise.   [facility] reading (PRIMARY) by  Dr. Lorelei Pont. Left foot: negative, possible old fracture at distal 3rd MT Left ankle: negative  LEFT ANKLE COMPLETE - 3+ VIEW  COMPARISON: The ankle joint appears normal. Alignment is normal. No fracture is seen. There is a small plantar calcaneal degenerative spur present.  FINDINGS: There is no evidence of fracture, dislocation, or joint effusion. There is no evidence of arthropathy or other focal bone abnormality. Soft tissues are unremarkable.  IMPRESSION: Negative.  Assessment and Plan: Left foot pain - Plan: DG Foot Complete Left, DG Ankle Complete Left  Obesity  Trial of short CAM- likely tendon strain in foot  Signed Lamar Blinks, MD  Received over-read report as below, called pt 5/28- he appears to have some chronic stress type changes.  Will  Refer to ortho to ensure we are following this properly LEFT FOOT - COMPLETE 3+ VIEW  COMPARISON: None.  FINDINGS: Tarsal-metatarsal alignment is normal. No fracture is seen. 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 No erosion is seen.  IMPRESSION: 1. No acute abnormality. 2. 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

## 2015-01-20 ENCOUNTER — Telehealth: Payer: Self-pay

## 2015-01-20 ENCOUNTER — Ambulatory Visit (INDEPENDENT_AMBULATORY_CARE_PROVIDER_SITE_OTHER): Payer: BLUE CROSS/BLUE SHIELD | Admitting: Emergency Medicine

## 2015-01-20 VITALS — BP 128/82 | HR 77 | Temp 98.1°F | Resp 17 | Ht 68.0 in | Wt 280.0 lb

## 2015-01-20 DIAGNOSIS — J069 Acute upper respiratory infection, unspecified: Secondary | ICD-10-CM

## 2015-01-20 DIAGNOSIS — E119 Type 2 diabetes mellitus without complications: Secondary | ICD-10-CM

## 2015-01-20 LAB — POCT CBC
Granulocyte percent: 64.5 %G (ref 37–80)
HCT, POC: 43 % — AB (ref 43.5–53.7)
HEMOGLOBIN: 14.4 g/dL (ref 14.1–18.1)
LYMPH, POC: 1.7 (ref 0.6–3.4)
MCH: 30.9 pg (ref 27–31.2)
MCHC: 33.4 g/dL (ref 31.8–35.4)
MCV: 92.6 fL (ref 80–97)
MID (cbc): 0.9 (ref 0–0.9)
MPV: 8.1 fL (ref 0–99.8)
POC GRANULOCYTE: 4.8 (ref 2–6.9)
POC LYMPH PERCENT: 23.5 %L (ref 10–50)
POC MID %: 12 % (ref 0–12)
Platelet Count, POC: 232 10*3/uL (ref 142–424)
RBC: 4.64 M/uL — AB (ref 4.69–6.13)
RDW, POC: 14.2 %
WBC: 7.4 10*3/uL (ref 4.6–10.2)

## 2015-01-20 LAB — GLUCOSE, POCT (MANUAL RESULT ENTRY): POC GLUCOSE: 110 mg/dL — AB (ref 70–99)

## 2015-01-20 LAB — POCT GLYCOSYLATED HEMOGLOBIN (HGB A1C): HEMOGLOBIN A1C: 6.6

## 2015-01-20 MED ORDER — AMOXICILLIN 875 MG PO TABS: 875.0000 mg | ORAL_TABLET | Freq: Two times a day (BID) | ORAL | Status: DC

## 2015-01-20 MED ORDER — AZELASTINE HCL 0.1 % NA SOLN: 2.0000 | Freq: Two times a day (BID) | NASAL | Status: DC

## 2015-01-20 NOTE — Telephone Encounter (Signed)
Patient is calling to get his x-ray done on his left foot on May 26. Please call when ready! [phone]

## 2015-01-20 NOTE — Progress Notes (Addendum)
Subjective:  This chart was scribed for Arlyss Queen MD, by Tamsen Roers, at [facility].  This patient was seen in room 12 and the patient's care was started at 11:51 AM.   Chief Complaint  Patient presents with  . Nasal Congestion  . Sore Throat    swollen   . Cough    started wednesday  . Chills    off and on      Patient ID: Rodney Turner, male    DOB: 1951/04/10, 64 y.o.   MRN: [id number]  HPI  HPI Comments: Rodney Turner is a 64 y.o. male who presents to the [facility] with multiple complaints.  Patient states that he has been trying to keep track of his sugar.   Cough: Patients symptoms started with congestion onset three days ago and a nonproductive cough onset two days ago.  He has associated symptoms of facial pain and states that his nasal drainage is clear. He has been taking mucin ex to alleviate his symptoms and states that it helps him. He has also been complaint with his Flonase.  He denies any wheezing.   Left foot pain: Patient was here about a week ago and had an x-ray done on his foot which showed possible chronic foot pain. He states that his ankle now feels much better after being in a boot.      Past Medical History  Diagnosis Date  . Allergic rhinitis   . History of kidney stones 1990's  . Diabetes mellitus 05/24/2014  . Morbid obesity 03/17/2014  . OBSTRUCTIVE SLEEP APNEA 02/22/2008    HST 2013:  AHI 51/hr.  Optimal pressure 12cm on autotitration.    . OSA on CPAP     Current Outpatient Prescriptions on File Prior to Visit  Medication Sig Dispense Refill  . aspirin 81 MG tablet Take 81 mg by mouth daily.    . fluticasone (FLONASE) 50 MCG/ACT nasal spray Place 2 sprays into both nostrils daily. 16 g 11  . guaiFENesin (MUCINEX) 600 MG 12 hr tablet Take 1,200 mg by mouth 2 (two) times daily as needed for cough or to loosen phlegm.     Marland Kitchen ibuprofen (ADVIL,MOTRIN) 100 MG tablet Take 100 mg by mouth every  6 (six) hours as needed for pain.     Marland Kitchen dextromethorphan (DELSYM) 30 MG/5ML liquid Take 10 mg by mouth daily as needed for cough.     . zoster vaccine live, PF, (ZOSTAVAX) 07371 UNT/0.65ML injection Inject 19,400 Units into the skin once. (Patient not taking: Reported on 09/23/2014) 1 each 0   No current facility-administered medications on file prior to visit.    Allergies  Allergen Reactions  . Codeine Other (See Comments)    REACTION: nausea    No results found for this or any previous visit (from the past 2160 hour(s)).     Review of Systems  HENT: Positive for congestion.   Eyes: Negative for pain, discharge and itching.  Respiratory: Positive for cough. Negative for choking and wheezing.   Gastrointestinal: Negative for nausea and vomiting.  Musculoskeletal: Negative for neck pain and neck stiffness.       Objective:   Physical Exam CONSTITUTIONAL: Well developed/well nourished HEAD: Normocephalic/atraumatic EYES: EOMI/PERRL ENMT: Mucous membranes moist, He has nasal congestion.  Ears and throat are normal.  NECK: supple no meningeal signs SPINE/BACK:entire spine nontender CV: S1/S2 noted, no murmurs/rubs/gallops noted LUNGS: Lungs are clear to auscultation bilaterally, no apparent  distress ABDOMEN: soft, nontender, no rebound or guarding, bowel sounds noted throughout abdomen GU:no cva tenderness NEURO: Pt is awake/alert/appropriate, moves all extremitiesx4.  No facial droop.   EXTREMITIES: pulses normal/equal, full ROM SKIN: warm, color normal PSYCH: no abnormalities of mood noted, alert and oriented to situation   Filed Vitals:   01/20/15 1124  BP: 128/82  Pulse: 77  Temp: 98.1 F (36.7 C)  TempSrc: Oral  Resp: 17  Height: 5\' 8"  (1.727 m)  Weight: 280 lb (127.007 kg)  SpO2: 96%       Results for orders placed or performed in visit on 01/20/15  POCT CBC  Result Value Ref Range   WBC 7.4 4.6 - 10.2 K/uL   Lymph, poc 1.7 0.6 - 3.4   POC LYMPH  PERCENT 23.5 10 - 50 %L   MID (cbc) 0.9 0 - 0.9   POC MID % 12.0 0 - 12 %M   POC Granulocyte 4.8 2 - 6.9   Granulocyte percent 64.5 37 - 80 %G   RBC 4.64 (A) 4.69 - 6.13 M/uL   Hemoglobin 14.4 14.1 - 18.1 g/dL   HCT, POC 43.0 (A) 43.5 - 53.7 %   MCV 92.6 80 - 97 fL   MCH, POC 30.9 27 - 31.2 pg   MCHC 33.4 31.8 - 35.4 g/dL   RDW, POC 14.2 %   Platelet Count, POC 232 142 - 424 K/uL   MPV 8.1 0 - 99.8 fL  POCT glucose (manual entry)  Result Value Ref Range   POC Glucose 110 (A) 70 - 99 mg/dl  POCT glycosylated hemoglobin (Hb A1C)  Result Value Ref Range   Hemoglobin A1C 6.6     Assessment & Plan:  I think this is an upper Restoril infection. Even though he does have sinus clear drainage he may be developing a sinus infection. His sugar is not under good control. I advised him to make an appointment to discuss this. Will treat with Astelin nasal spray along with amoxicillin.I personally performed the services described in this documentation, which was scribed in my presence. The recorded information has been reviewed and is accurate.  Nena Jordan, MD

## 2015-05-23 ENCOUNTER — Encounter: Payer: Self-pay | Admitting: Emergency Medicine

## 2015-06-19 ENCOUNTER — Telehealth: Payer: Self-pay | Admitting: Pulmonary Disease

## 2015-06-19 NOTE — Telephone Encounter (Signed)
Called and spoke to pt. Pt states he does not need CPAP supplies at this time and is able to wait till he sees Dr. Elsworth Soho. Advised pt if anything changes or he feels he does need supplies then to call us back. Pt verbalized understanding and denied any further questions or concerns at this time.

## 2015-09-15 ENCOUNTER — Ambulatory Visit: Payer: BLUE CROSS/BLUE SHIELD | Admitting: Pulmonary Disease

## 2015-09-25 ENCOUNTER — Ambulatory Visit (INDEPENDENT_AMBULATORY_CARE_PROVIDER_SITE_OTHER): Payer: Self-pay | Admitting: Pulmonary Disease

## 2015-09-25 ENCOUNTER — Encounter: Payer: Self-pay | Admitting: Pulmonary Disease

## 2015-09-25 VITALS — BP 128/80 | HR 82 | Ht 67.5 in | Wt 276.0 lb

## 2015-09-25 DIAGNOSIS — G4733 Obstructive sleep apnea (adult) (pediatric): Secondary | ICD-10-CM

## 2015-09-25 NOTE — Patient Instructions (Signed)
Your CPAP is set at 12 cm CPAP supplies will be renewed x  1year Call if needed

## 2015-09-25 NOTE — Progress Notes (Signed)
   Subjective:    Patient ID: Rodney Turner, male    DOB: [birth year], 65 y.o.   MRN: [id number]  HPI  Chief Complaint  Patient presents with  . Follow-up    former [facility] pt. seen for OSA. pt. states he wears CPAP 8 hr everynight. feels pressure is good. mask needed. DME:[facility]   Nasal pillows  Pressure okay CPAP has really helped with his daytime somnolence, no snoring per wife His weight is unchanged Download report reviewed- good usage, CPAP 12 cm, mild leak   HST 2013:  AHI 51/hr.  Optimal pressure 12cm on autotitration.   Past Medical History  Diagnosis Date  . Allergic rhinitis   . History of kidney stones 1990's  . Diabetes mellitus ([place]) 05/24/2014  . Morbid obesity ([place]) 03/17/2014  . OBSTRUCTIVE SLEEP APNEA 02/22/2008    HST 2013:  AHI 51/hr.  Optimal pressure 12cm on autotitration.    . OSA on CPAP      Review of Systems neg for any significant sore throat, dysphagia, itching, sneezing, nasal congestion or excess/ purulent secretions, fever, chills, sweats, unintended wt loss, pleuritic or exertional cp, hempoptysis, orthopnea pnd or change in chronic leg swelling. Also denies presyncope, palpitations, heartburn, abdominal pain, nausea, vomiting, diarrhea or change in bowel or urinary habits, dysuria,hematuria, rash, arthralgias, visual complaints, headache, numbness weakness or ataxia.     Objective:   Physical Exam  Gen. Pleasant, obese, in no distress ENT - no lesions, no post nasal drip Neck: No JVD, no thyromegaly, no carotid bruits Lungs: no use of accessory muscles, no dullness to percussion, decreased without rales or rhonchi  Cardiovascular: Rhythm regular, heart sounds  normal, no murmurs or gallops, no peripheral edema Musculoskeletal: No deformities, no cyanosis or clubbing , no tremors        Assessment & Plan:

## 2015-09-26 NOTE — Assessment & Plan Note (Signed)
CPAP supplies will be renewed for a year CPAP is really helped with the somnolence fatigue and provides him refreshing sleep  Weight loss encouraged, compliance with goal of at least 4-6 hrs every night is the expectation. Advised against medications with sedative side effects Cautioned against driving when sleepy - understanding that sleepiness will vary on a day to day basis  Greater than 50% time was spent in counseling and coordination of care with the patient

## 2015-10-11 ENCOUNTER — Encounter: Payer: Self-pay | Admitting: Pulmonary Disease

## 2015-10-20 ENCOUNTER — Ambulatory Visit (INDEPENDENT_AMBULATORY_CARE_PROVIDER_SITE_OTHER): Payer: BLUE CROSS/BLUE SHIELD

## 2015-10-20 ENCOUNTER — Ambulatory Visit (INDEPENDENT_AMBULATORY_CARE_PROVIDER_SITE_OTHER): Payer: BLUE CROSS/BLUE SHIELD | Admitting: Physician Assistant

## 2015-10-20 VITALS — BP 118/70 | HR 86 | Temp 99.2°F | Resp 16 | Ht 67.5 in | Wt 272.0 lb

## 2015-10-20 DIAGNOSIS — R05 Cough: Secondary | ICD-10-CM

## 2015-10-20 DIAGNOSIS — J069 Acute upper respiratory infection, unspecified: Secondary | ICD-10-CM

## 2015-10-20 DIAGNOSIS — R058 Other specified cough: Secondary | ICD-10-CM

## 2015-10-20 LAB — POCT CBC
Granulocyte percent: 65.2 %G (ref 37–80)
HCT, POC: 43.4 % — AB (ref 43.5–53.7)
HEMOGLOBIN: 15.5 g/dL (ref 14.1–18.1)
LYMPH, POC: 1.3 (ref 0.6–3.4)
MCH, POC: 33.6 pg — AB (ref 27–31.2)
MCHC: 35.8 g/dL — AB (ref 31.8–35.4)
MCV: 93.8 fL (ref 80–97)
MID (cbc): 0.8 (ref 0–0.9)
MPV: 8.3 fL (ref 0–99.8)
PLATELET COUNT, POC: 180 10*3/uL (ref 142–424)
POC Granulocyte: 4 (ref 2–6.9)
POC LYMPH PERCENT: 21.3 %L (ref 10–50)
POC MID %: 13.5 %M — AB (ref 0–12)
RBC: 4.63 M/uL — AB (ref 4.69–6.13)
RDW, POC: 13.7 %
WBC: 6.2 10*3/uL (ref 4.6–10.2)

## 2015-10-20 MED ORDER — AZITHROMYCIN 250 MG PO TABS: ORAL_TABLET | ORAL | Status: DC

## 2015-10-20 NOTE — Progress Notes (Signed)
[facility] [address] [place] [address] [phone]  Date:  10/20/2015   Name:  FREE DIOP   DOB:  [birth year]   MRN:  [id number]  PCP:  Jenny Reichmann, MD    History of Present Illness:  JAIVEER TRABERT is a 65 y.o. male patient who presents to [facility] for cc of cough, nasal congestion and throat pain.     Cough started 2 days ago in the evening.  Cough worsened through the night.  100.3 fever.  Body aches.  He feels malaise.  Coughing up sputum.  Off and on.  Congestion and rhinorrhea.  No sneezing, no watery eyes.    He is taking flonase, 2 mucinex today --2 in the morning and at night.  Started delsym which helped some.    Patient Active Problem List   Diagnosis Date Noted  . Hyperlipidemia 06/06/2014  . LBBB (left bundle branch block) 06/06/2014  . Diabetes mellitus ([place]) 05/24/2014  . Morbid obesity ([place]) 03/17/2014  . Obstructive sleep apnea 02/22/2008    Past Medical History  Diagnosis Date  . Allergic rhinitis   . History of kidney stones 1990's  . Diabetes mellitus ([place]) 05/24/2014  . Morbid obesity ([place]) 03/17/2014  . OBSTRUCTIVE SLEEP APNEA 02/22/2008    HST 2013:  AHI 51/hr.  Optimal pressure 12cm on autotitration.    . OSA on CPAP     Past Surgical History  Procedure Laterality Date  . Knee surgery      left    Social History  Substance Use Topics  . Smoking status: Never Smoker   . Smokeless tobacco: Never Used  . Alcohol Use: No    Family History  Problem Relation Age of Onset  . Snoring Father   . Esophageal varices Father   . Diabetes Paternal Grandmother   . Heart disease Mother   . Gallstones Mother     Allergies  Allergen Reactions  . Codeine Other (See Comments)    REACTION: nausea    Medication list has been reviewed and updated.  Current Outpatient Prescriptions on File Prior to Visit  Medication Sig Dispense Refill  . aspirin 81 MG tablet Take 81 mg by mouth daily.    . fluticasone (FLONASE) 50  MCG/ACT nasal spray Place 2 sprays into both nostrils daily. 16 g 11  . guaiFENesin (MUCINEX) 600 MG 12 hr tablet Take 1,200 mg by mouth 2 (two) times daily as needed for cough or to loosen phlegm.     Marland Kitchen ibuprofen (ADVIL,MOTRIN) 100 MG tablet Take 100 mg by mouth every 6 (six) hours as needed for pain.      No current facility-administered medications on file prior to visit.    ROS ROS otherwise unremarkable unless listed above.  Physical Examination: BP 118/70 mmHg  Pulse 86  Temp(Src) 99.2 F (37.3 C) (Oral)  Resp 16  Ht 5' 7.5" (1.715 m)  Wt 272 lb (123.378 kg)  BMI 41.95 kg/m2  SpO2 95% Ideal Body Weight: Weight in (lb) to have BMI = 25: 161.7  Physical Exam  Constitutional: He is oriented to person, place, and time. He appears well-developed and well-nourished. No distress.  HENT:  Head: Atraumatic.  Right Ear: Tympanic membrane, external ear and ear canal normal.  Left Ear: Tympanic membrane, external ear and ear canal normal.  Nose: Mucosal edema and rhinorrhea present. Right sinus exhibits maxillary sinus tenderness. Right sinus exhibits no frontal sinus tenderness. Left sinus exhibits maxillary sinus  tenderness. Left sinus exhibits no frontal sinus tenderness.  Mouth/Throat: No uvula swelling. Posterior oropharyngeal erythema present. No oropharyngeal exudate or posterior oropharyngeal edema.  Eyes: Conjunctivae, EOM and lids are normal. Pupils are equal, round, and reactive to light. Right eye exhibits normal extraocular motion. Left eye exhibits normal extraocular motion.  Neck: Trachea normal and full passive range of motion without pain. No edema and no erythema present.  Cardiovascular: Normal rate.   Pulmonary/Chest: Effort normal. No respiratory distress. He has no decreased breath sounds. He has no wheezes. He has no rhonchi.  Neurological: He is alert and oriented to person, place, and time.  Skin: Skin is warm and dry. He is not diaphoretic.  Psychiatric: He has  a normal mood and affect. His behavior is normal.   Dg Chest 2 View  10/20/2015  CLINICAL DATA:  Productive cough for the past 2 days with increasing symptoms, fever, and body aches. EXAM: CHEST  2 VIEW COMPARISON:  None in PACs FINDINGS: The lungs are adequately inflated. There is no focal infiltrate. There is no pleural effusion. The heart and pulmonary vascularity are normal. The mediastinum is normal in width. There is mild multilevel endplate spurring in the lower thoracic spine. IMPRESSION: There is no evidence of pneumonia nor other acute cardiopulmonary abnormality. Electronically Signed   By: David  Martinique M.D.   On: 10/20/2015 11:41    Assessment and Plan: JULES SELIGA is a 65 y.o. male who is here today for cc of cough, congestion and fever. Given abx at this time.  Advised supportive treatment, if he continues to have sxs  Acute upper respiratory infection - Plan: azithromycin (ZITHROMAX) 250 MG tablet  Productive cough - Plan: POCT CBC, DG Chest 2 View   Ivar Drape, PA-C [facility] 10/20/2015 10:53 AM

## 2015-10-20 NOTE — Patient Instructions (Addendum)
Because you received an x-ray today, you will receive an invoice from [facility]. Please contact [facility] at [phone] with questions or concerns regarding your invoice. Our billing staff will not be able to assist you with those questions. Please hydrate well with water 64 oz per day if not more. You can use the Delsym for cough.    Upper Respiratory Infection, Adult Most upper respiratory infections (URIs) are a viral infection of the air passages leading to the lungs. A URI affects the nose, throat, and upper air passages. The most common type of URI is nasopharyngitis and is typically referred to as "the common cold." URIs run their course and usually go away on their own. Most of the time, a URI does not require medical attention, but sometimes a bacterial infection in the upper airways can follow a viral infection. This is called a secondary infection. Sinus and middle ear infections are common types of secondary upper respiratory infections. Bacterial pneumonia can also complicate a URI. A URI can worsen asthma and chronic obstructive pulmonary disease (COPD). Sometimes, these complications can require emergency medical care and may be life threatening.  CAUSES Almost all URIs are caused by viruses. A virus is a type of germ and can spread from one person to another.  RISKS FACTORS You may be at risk for a URI if:   You smoke.   You have chronic heart or lung disease.  You have a weakened defense (immune) system.   You are very young or very old.   You have nasal allergies or asthma.  You work in crowded or poorly ventilated areas.  You work in health care facilities or schools. SIGNS AND SYMPTOMS  Symptoms typically develop 2-3 days after you come in contact with a cold virus. Most viral URIs last 7-10 days. However, viral URIs from the influenza virus (flu virus) can last 14-18 days and are typically more severe. Symptoms may include:   Runny or  stuffy (congested) nose.   Sneezing.   Cough.   Sore throat.   Headache.   Fatigue.   Fever.   Loss of appetite.   Pain in your forehead, behind your eyes, and over your cheekbones (sinus pain).  Muscle aches.  DIAGNOSIS  Your health care provider may diagnose a URI by:  Physical exam.  Tests to check that your symptoms are not due to another condition such as:  Strep throat.  Sinusitis.  Pneumonia.  Asthma. TREATMENT  A URI goes away on its own with time. It cannot be cured with medicines, but medicines may be prescribed or recommended to relieve symptoms. Medicines may help:  Reduce your fever.  Reduce your cough.  Relieve nasal congestion. HOME CARE INSTRUCTIONS   Take medicines only as directed by your health care provider.   Gargle warm saltwater or take cough drops to comfort your throat as directed by your health care provider.  Use a warm mist humidifier or inhale steam from a shower to increase air moisture. This may make it easier to breathe.  Drink enough fluid to keep your urine clear or pale yellow.   Eat soups and other clear broths and maintain good nutrition.   Rest as needed.   Return to work when your temperature has returned to normal or as your health care provider advises. You may need to stay home longer to avoid infecting others. You can also use a face mask and careful hand washing to prevent spread of the virus.  Increase  the usage of your inhaler if you have asthma.   Do not use any tobacco products, including cigarettes, chewing tobacco, or electronic cigarettes. If you need help quitting, ask your health care provider. PREVENTION  The best way to protect yourself from getting a cold is to practice good hygiene.   Avoid oral or hand contact with people with cold symptoms.   Wash your hands often if contact occurs.  There is no clear evidence that vitamin C, vitamin E, echinacea, or exercise reduces the chance  of developing a cold. However, it is always recommended to get plenty of rest, exercise, and practice good nutrition.  SEEK MEDICAL CARE IF:   You are getting worse rather than better.   Your symptoms are not controlled by medicine.   You have chills.  You have worsening shortness of breath.  You have brown or red mucus.  You have yellow or brown nasal discharge.  You have pain in your face, especially when you bend forward.  You have a fever.  You have swollen neck glands.  You have pain while swallowing.  You have white areas in the back of your throat. SEEK IMMEDIATE MEDICAL CARE IF:   You have severe or persistent:  Headache.  Ear pain.  Sinus pain.  Chest pain.  You have chronic lung disease and any of the following:  Wheezing.  Prolonged cough.  Coughing up blood.  A change in your usual mucus.  You have a stiff neck.  You have changes in your:  Vision.  Hearing.  Thinking.  Mood. MAKE SURE YOU:   Understand these instructions.  Will watch your condition.  Will get help right away if you are not doing well or get worse.   This information is not intended to replace advice given to you by your health care provider. Make sure you discuss any questions you have with your health care provider.   Document Released: 01/29/2001 Document Revised: 12/20/2014 Document Reviewed: 11/10/2013 Elsevier Interactive Patient Education [employer].

## 2016-04-16 ENCOUNTER — Ambulatory Visit (INDEPENDENT_AMBULATORY_CARE_PROVIDER_SITE_OTHER): Payer: BLUE CROSS/BLUE SHIELD | Admitting: Emergency Medicine

## 2016-04-16 VITALS — BP 130/80 | HR 70 | Temp 98.2°F | Resp 17 | Ht 66.5 in | Wt 248.0 lb

## 2016-04-16 DIAGNOSIS — R634 Abnormal weight loss: Secondary | ICD-10-CM

## 2016-04-16 DIAGNOSIS — E669 Obesity, unspecified: Secondary | ICD-10-CM

## 2016-04-16 DIAGNOSIS — Z125 Encounter for screening for malignant neoplasm of prostate: Secondary | ICD-10-CM

## 2016-04-16 DIAGNOSIS — Z1159 Encounter for screening for other viral diseases: Secondary | ICD-10-CM | POA: Diagnosis not present

## 2016-04-16 DIAGNOSIS — E119 Type 2 diabetes mellitus without complications: Secondary | ICD-10-CM | POA: Diagnosis not present

## 2016-04-16 DIAGNOSIS — G4733 Obstructive sleep apnea (adult) (pediatric): Secondary | ICD-10-CM

## 2016-04-16 LAB — POCT CBC
GRANULOCYTE PERCENT: 60 % (ref 37–80)
HCT, POC: 47.5 % (ref 43.5–53.7)
HEMOGLOBIN: 16.8 g/dL (ref 14.1–18.1)
Lymph, poc: 2.4 (ref 0.6–3.4)
MCH: 32.7 pg — AB (ref 27–31.2)
MCHC: 35.4 g/dL (ref 31.8–35.4)
MCV: 92.5 fL (ref 80–97)
MID (CBC): 0.5 (ref 0–0.9)
MPV: 8.6 fL (ref 0–99.8)
PLATELET COUNT, POC: 195 10*3/uL (ref 142–424)
POC Granulocyte: 4.4 (ref 2–6.9)
POC LYMPH PERCENT: 32.5 %L (ref 10–50)
POC MID %: 7.5 %M (ref 0–12)
RBC: 5.14 M/uL (ref 4.69–6.13)
RDW, POC: 13.2 %
WBC: 7.3 10*3/uL (ref 4.6–10.2)

## 2016-04-16 LAB — POCT GLYCOSYLATED HEMOGLOBIN (HGB A1C)

## 2016-04-16 LAB — GLUCOSE, POCT (MANUAL RESULT ENTRY): POC Glucose: 424 mg/dl — AB (ref 70–99)

## 2016-04-16 MED ORDER — GLIPIZIDE ER 5 MG PO TB24: 5.0000 mg | ORAL_TABLET | Freq: Every day | ORAL | 3 refills | Status: DC

## 2016-04-16 MED ORDER — METFORMIN HCL 500 MG PO TABS: 500.0000 mg | ORAL_TABLET | Freq: Two times a day (BID) | ORAL | 3 refills | Status: DC

## 2016-04-16 NOTE — Progress Notes (Signed)
Subjective:  This chart was scribed for Arlyss Queen MD, by Tamsen Roers, at [facility].  This patient was seen in room 11 and the patient's care was started at 11:31 AM.   Chief Complaint  Patient presents with  . Fatigue  . Weight Loss     Patient ID: Rodney Turner, male    DOB: 11/04/1950, 65 y.o.   MRN: [id number]  HPI HPI Comments: Rodney Turner is a 65 y.o. male who presents to the [facility] complaining of weight loss which started about two months ago (has lost about 24 lbs since his last visit).  He has been drinking more water and is no longer drinking soft drinks.  He denies any change in diet other than staying away from sugary foods. He is also complaining of waking up to use the restroom every 2 hours and  has fatigue and dry mouth.  Patient notes that he has a lot of work stress due to the changes which have been occurring. Patients mother is 44 years old and has lost her short term memory.  Due to his mothers condition, patient is having to travel to [place] and take care of her. He has no down time when he is taking care of her and feels that this is extremely tiring for him as he does it almost every weekend.   Denies any issue with his vision, chest pain, abdominal pain or difficulty urinating.     Filed Weights   04/16/16 1100  Weight: 248 lb (112.5 kg)       Patient Active Problem List   Diagnosis Date Noted  . Hyperlipidemia 06/06/2014  . LBBB (left bundle branch block) 06/06/2014  . Diabetes mellitus ([place]) 05/24/2014  . Morbid obesity ([place]) 03/17/2014  . Obstructive sleep apnea 02/22/2008   Past Medical History:  Diagnosis Date  . Allergic rhinitis   . Diabetes mellitus ([place]) 05/24/2014  . History of kidney stones 1990's  . Morbid obesity ([place]) 03/17/2014  . OBSTRUCTIVE SLEEP APNEA 02/22/2008   HST 2013:  AHI 51/hr.  Optimal pressure 12cm on autotitration.    . OSA on CPAP    Past Surgical  History:  Procedure Laterality Date  . KNEE SURGERY     left   Allergies  Allergen Reactions  . Codeine Other (See Comments)    REACTION: nausea   Prior to Admission medications   Medication Sig Start Date End Date Taking? Authorizing Provider  aspirin 81 MG tablet Take 81 mg by mouth daily.   Yes Historical Provider, MD  fluticasone (FLONASE) 50 MCG/ACT nasal spray Place 2 sprays into both nostrils daily. 07/07/13  Yes Thao P Le, DO  ibuprofen (ADVIL,MOTRIN) 100 MG tablet Take 100 mg by mouth every 6 (six) hours as needed for pain.    Yes Historical Provider, MD  guaiFENesin (MUCINEX) 600 MG 12 hr tablet Take 1,200 mg by mouth 2 (two) times daily as needed for cough or to loosen phlegm.     Historical Provider, MD   Social History   Social History  . Marital status: Married    Spouse name: N/A  . Number of children: N/A  . Years of education: N/A   Occupational History  . sales    Social History Main Topics  . Smoking status: Never Smoker  . Smokeless tobacco: Never Used  . Alcohol use No  . Drug use: No  . Sexual activity: Yes  Birth control/ protection: None   Other Topics Concern  . Not on file   Social History Narrative  . No narrative on file      Review of Systems  Constitutional: Positive for unexpected weight change. Negative for appetite change, chills and fever.  Respiratory: Negative for cough and shortness of breath.   Gastrointestinal: Negative for abdominal pain, nausea and vomiting.  Genitourinary: Positive for frequency. Negative for difficulty urinating.       Objective:   Physical Exam  Vitals:   04/16/16 1100  BP: 130/80  Pulse: 70  Resp: 17  Temp: 98.2 F (36.8 C)  TempSrc: Oral  SpO2: 96%  Weight: 248 lb (112.5 kg)  Height: 5' 6.5" (1.689 m)    CONSTITUTIONAL: Well developed/well nourished HEAD: Normocephalic/atraumatic EYES: EOMI/PERRL ENMT: Mucous membranes moist NECK: supple no meningeal signs SPINE/BACK:entire spine  nontender CV: S1/S2 noted, no murmurs/rubs/gallops noted LUNGS: Lungs are clear to auscultation bilaterally, no apparent distress NEURO: Pt is awake/alert/appropriate, moves all extremitiesx4.  No facial droop.   EXTREMITIES: pulses normal/equal, full ROM SKIN: warm, color normal PSYCH: no abnormalities of mood noted, alert and oriented to situation  Results for orders placed or performed in visit on 04/16/16  POCT glucose (manual entry)  Result Value Ref Range   POC Glucose 424 (A) 70 - 99 mg/dl  POCT CBC  Result Value Ref Range   WBC 7.3 4.6 - 10.2 K/uL   Lymph, poc 2.4 0.6 - 3.4   POC LYMPH PERCENT 32.5 10 - 50 %L   MID (cbc) 0.5 0 - 0.9   POC MID % 7.5 0 - 12 %M   POC Granulocyte 4.4 2 - 6.9   Granulocyte percent 60.0 37 - 80 %G   RBC 5.14 4.69 - 6.13 M/uL   Hemoglobin 16.8 14.1 - 18.1 g/dL   HCT, POC 47.5 43.5 - 53.7 %   MCV 92.5 80 - 97 fL   MCH, POC 32.7 (A) 27 - 31.2 pg   MCHC 35.4 31.8 - 35.4 g/dL   RDW, POC 13.2 %   Platelet Count, POC 195 142 - 424 K/uL   MPV 8.6 0 - 99.8 fL  POCT glycosylated hemoglobin (Hb A1C)  Result Value Ref Range   Hemoglobin A1C >14.0        Assessment & Plan:  Patient's last hemoglobin A1c was 6.6 a year ago. Today it is 14. He was supposed to be working on diet exercise and weight loss. He has lost weight recently but I suspect it is secondary to uncontrolled diabetes. I started him on metformin and Glucotrol. Advised to recheck in one month. He declined his flu shot. He declined pneumococcal vaccine.I personally performed the services described in this documentation, which was scribed in my presence. The recorded information has been reviewed and is accurate.  Darlyne Russian, MD

## 2016-04-16 NOTE — Patient Instructions (Addendum)
   IF you received an x-ray today, you will receive an invoice from [facility]. Please contact [facility] at [phone] with questions or concerns regarding your invoice.   IF you received labwork today, you will receive an invoice from [facility]. Please contact [facility] at [phone] with questions or concerns regarding your invoice.   Our billing staff will not be able to assist you with questions regarding bills from these companies.  You will be contacted with the lab results as soon as they are available. The fastest way to get your results is to activate your My Chart account. Instructions are located on the last page of this paperwork. If you have not heard from us regarding the results in 2 weeks, please contact this office.     Type 2 Diabetes Mellitus, Adult Type 2 diabetes mellitus, often simply referred to as type 2 diabetes, is a long-lasting (chronic) disease. In type 2 diabetes, the pancreas does not make enough insulin (a hormone), the cells are less responsive to the insulin that is made (insulin resistance), or both. Normally, insulin moves sugars from food into the tissue cells. The tissue cells use the sugars for energy. The lack of insulin or the lack of normal response to insulin causes excess sugars to build up in the blood instead of going into the tissue cells. As a result, high blood sugar (hyperglycemia) develops. The effect of high sugar (glucose) levels can cause many complications. Type 2 diabetes was also previously called adult-onset diabetes, but it can occur at any age.  RISK FACTORS  A person is predisposed to developing type 2 diabetes if someone in the family has the disease and also has one or more of the following primary risk factors:  Weight gain, or being overweight or obese.  An inactive lifestyle.  A history of consistently eating high-calorie foods. Maintaining a normal weight and regular  physical activity can reduce the chance of developing type 2 diabetes. SYMPTOMS  A person with type 2 diabetes may not show symptoms initially. The symptoms of type 2 diabetes appear slowly. The symptoms include:  Increased thirst (polydipsia).  Increased urination (polyuria).  Increased urination during the night (nocturia).  Sudden or unexplained weight changes.  Frequent, recurring infections.  Tiredness (fatigue).  Weakness.  Vision changes, such as blurred vision.  Fruity smell to your breath.  Abdominal pain.  Nausea or vomiting.  Cuts or bruises which are slow to heal.  Tingling or numbness in the hands or feet.  An open skin wound (ulcer). DIAGNOSIS Type 2 diabetes is frequently not diagnosed until complications of diabetes are present. Type 2 diabetes is diagnosed when symptoms or complications are present and when blood glucose levels are increased. Your blood glucose level may be checked by one or more of the following blood tests:  A fasting blood glucose test. You will not be allowed to eat for at least 8 hours before a blood sample is taken.  A random blood glucose test. Your blood glucose is checked at any time of the day regardless of when you ate.  A hemoglobin A1c blood glucose test. A hemoglobin A1c test provides information about blood glucose control over the previous 3 months.  An oral glucose tolerance test (OGTT). Your blood glucose is measured after you have not eaten (fasted) for 2 hours and then after you drink a glucose-containing beverage. TREATMENT   You may need to take insulin or diabetes medicine daily to keep blood glucose   levels in the desired range.  If you use insulin, you may need to adjust the dosage depending on the carbohydrates that you eat with each meal or snack.  Lifestyle changes are recommended as part of your treatment. These may include:  Following an individualized diet plan developed by a nutritionist or  dietitian.  Exercising daily. Your health care providers will set individualized treatment goals for you based on your age, your medicines, how long you have had diabetes, and any other medical conditions you have. Generally, the goal of treatment is to maintain the following blood glucose levels:  Before meals (preprandial): 80-130 mg/dL.  After meals (postprandial): below 180 mg/dL.  A1c: less than 6.5-7%. HOME CARE INSTRUCTIONS   Have your hemoglobin A1c level checked twice a year.  Perform daily blood glucose monitoring as directed by your health care provider.  Monitor urine ketones when you are ill and as directed by your health care provider.  Take your diabetes medicine or insulin as directed by your health care provider to maintain your blood glucose levels in the desired range.  Never run out of diabetes medicine or insulin. It is needed every day.  If you are using insulin, you may need to adjust the amount of insulin given based on your intake of carbohydrates. Carbohydrates can raise blood glucose levels but need to be included in your diet. Carbohydrates provide vitamins, minerals, and fiber which are an essential part of a healthy diet. Carbohydrates are found in fruits, vegetables, whole grains, dairy products, legumes, and foods containing added sugars.  Eat healthy foods. You should make an appointment to see a registered dietitian to help you create an eating plan that is right for you.  Lose weight if you are overweight.  Carry a medical alert card or wear your medical alert jewelry.  Carry a 15-gram carbohydrate snack with you at all times to treat low blood glucose (hypoglycemia). Some examples of 15-gram carbohydrate snacks include:  Glucose tablets, 3 or 4.  Glucose gel, 15-gram tube.  Raisins, 2 tablespoons (24 grams).  Jelly beans, 6.  Animal crackers, 8.  Regular pop, 4 ounces (120 mL).  Gummy treats, 9.  Recognize hypoglycemia. Hypoglycemia  occurs with blood glucose levels of 70 mg/dL and below. The risk for hypoglycemia increases when fasting or skipping meals, during or after intense exercise, and during sleep. Hypoglycemia symptoms can include:  Tremors or shakes.  Decreased ability to concentrate.  Sweating.  Increased heart rate.  Headache.  Dry mouth.  Hunger.  Irritability.  Anxiety.  Restless sleep.  Altered speech or coordination.  Confusion.  Treat hypoglycemia promptly. If you are alert and able to safely swallow, follow the 15:15 rule:  Take 15-20 grams of rapid-acting glucose or carbohydrate. Rapid-acting options include glucose gel, glucose tablets, or 4 ounces (120 mL) of fruit juice, regular soda, or low-fat milk.  Check your blood glucose level 15 minutes after taking the glucose.  Take 15-20 grams more of glucose if the repeat blood glucose level is still 70 mg/dL or below.  Eat a meal or snack within 1 hour once blood glucose levels return to normal.  Be alert to feeling very thirsty and urinating more frequently than usual, which are early signs of hyperglycemia. An early awareness of hyperglycemia allows for prompt treatment. Treat hyperglycemia as directed by your health care provider.  Engage in at least 150 minutes of moderate-intensity physical activity a week, spread over at least 3 days of the week or as directed   by your health care provider. In addition, you should engage in resistance exercise at least 2 times a week or as directed by your health care provider. Try to spend no more than 90 minutes at one time inactive.  Adjust your medicine and food intake as needed if you start a new exercise or sport.  Follow your sick-day plan anytime you are unable to eat or drink as usual.  Do not use any tobacco products including cigarettes, chewing tobacco, or electronic cigarettes. If you need help quitting, ask your health care provider.  Limit alcohol intake to no more than 1 drink  per day for nonpregnant women and 2 drinks per day for men. You should drink alcohol only when you are also eating food. Talk with your health care provider whether alcohol is safe for you. Tell your health care provider if you drink alcohol several times a week.  Keep all follow-up visits as directed by your health care provider. This is important.  Schedule an eye exam soon after the diagnosis of type 2 diabetes and then annually.  Perform daily skin and foot care. Examine your skin and feet daily for cuts, bruises, redness, nail problems, bleeding, blisters, or sores. A foot exam by a health care provider should be done annually.  Brush your teeth and gums at least twice a day and floss at least once a day. Follow up with your dentist regularly.  Share your diabetes management plan with your workplace or school.  Keep your immunizations up to date. It is recommended that you receive a flu (influenza) vaccine every year. It is also recommended that you receive a pneumonia (pneumococcal) vaccine. If you are 65 years of age or older and have never received a pneumonia vaccine, this vaccine may be given as a series of two separate shots. Ask your health care provider which additional vaccines may be recommended.  Learn to manage stress.  Obtain ongoing diabetes education and support as needed.  Participate in or seek rehabilitation as needed to maintain or improve independence and quality of life. Request a physical or occupational therapy referral if you are having foot or hand numbness, or difficulties with grooming, dressing, eating, or physical activity. SEEK MEDICAL CARE IF:   You are unable to eat food or drink fluids for more than 6 hours.  You have nausea and vomiting for more than 6 hours.  Your blood glucose level is over 240 mg/dL.  There is a change in mental status.  You develop an additional serious illness.  You have diarrhea for more than 6 hours.  You have been sick  or have had a fever for a couple of days and are not getting better.  You have pain during any physical activity.  SEEK IMMEDIATE MEDICAL CARE IF:  You have difficulty breathing.  You have moderate to large ketone levels.   This information is not intended to replace advice given to you by your health care provider. Make sure you discuss any questions you have with your health care provider.   Document Released: 08/05/2005 Document Revised: 04/26/2015 Document Reviewed: 03/03/2012 Elsevier Interactive Patient Education 2016 [employer] Inc[employer]  

## 2016-04-17 LAB — COMPLETE METABOLIC PANEL WITH GFR
ALK PHOS: 101 U/L (ref 40–115)
ALT: 36 U/L (ref 9–46)
AST: 25 U/L (ref 10–35)
Albumin: 4.2 g/dL (ref 3.6–5.1)
BUN: 13 mg/dL (ref 7–25)
CHLORIDE: 99 mmol/L (ref 98–110)
CO2: 27 mmol/L (ref 20–31)
Calcium: 9.7 mg/dL (ref 8.6–10.3)
Creat: 1.05 mg/dL (ref 0.70–1.25)
GFR, EST NON AFRICAN AMERICAN: 74 mL/min (ref >=60)
GFR, Est African American: 86 mL/min (ref >=60)
GLUCOSE: 424 mg/dL — AB (ref 65–99)
POTASSIUM: 5.3 mmol/L (ref 3.5–5.3)
SODIUM: 135 mmol/L (ref 135–146)
Total Bilirubin: 0.9 mg/dL (ref 0.2–1.2)
Total Protein: 7.5 g/dL (ref 6.1–8.1)

## 2016-04-17 LAB — HEPATITIS C ANTIBODY: HCV AB: NEGATIVE

## 2016-04-17 LAB — LIPID PANEL
CHOL/HDL RATIO: 4.2 ratio (ref <=5.0)
Cholesterol: 138 mg/dL (ref 125–200)
HDL: 33 mg/dL — AB (ref >=40)
LDL Cholesterol: 73 mg/dL (ref <130)
Triglycerides: 162 mg/dL — ABNORMAL HIGH (ref <150)
VLDL: 32 mg/dL — AB (ref <30)

## 2016-04-17 LAB — PSA: PSA: 0.2 ng/mL (ref <=4.0)

## 2016-04-18 ENCOUNTER — Telehealth: Payer: Self-pay | Admitting: *Deleted

## 2016-04-18 NOTE — Telephone Encounter (Signed)
Patient notified of results.  Checked glucose yesterday and it was 322.  He has an appt with Dr. Cruzita Lederer on 05/31/16.

## 2016-04-18 NOTE — Telephone Encounter (Signed)
-----   Message from Darlyne Russian, MD sent at 04/17/2016  7:30 AM EDT ----- Labs are good except for his sugar over 400. Triglycerides are elevated with a low HDL. All of this is due to his diabetes.

## 2016-05-06 ENCOUNTER — Encounter: Payer: Self-pay | Admitting: Emergency Medicine

## 2016-05-08 LAB — HM DIABETES EYE EXAM

## 2016-05-13 ENCOUNTER — Other Ambulatory Visit: Payer: Self-pay | Admitting: Emergency Medicine

## 2016-05-13 DIAGNOSIS — E119 Type 2 diabetes mellitus without complications: Secondary | ICD-10-CM

## 2016-05-13 MED ORDER — ATORVASTATIN CALCIUM 10 MG PO TABS: 10.0000 mg | ORAL_TABLET | Freq: Every day | ORAL | 3 refills | Status: DC

## 2016-05-13 NOTE — Telephone Encounter (Signed)
Call patient let him know I have also sent and Lipitor 20 mg for him to take at night to help prevent coronary artery disease related to his diabetes.

## 2016-05-14 NOTE — Telephone Encounter (Signed)
Pt called back just to report on his BS readings. He stated that it took a little time to get it under control, but since then, for last two weeks he has been checking readings around 5 pm each day and they are running between 90 - 110. He just wanted Dr Everlene Farrier to know how well he is doing on the new meds. He agreed to take the lipitor also.

## 2016-05-14 NOTE — Telephone Encounter (Signed)
LMOM for pt explaining new Rx for lipitor. Asked pt to CB if he has any ?s/concerns.

## 2016-05-14 NOTE — Telephone Encounter (Signed)
Call patient. This sounds great. Keep up the good work.

## 2016-05-14 NOTE — Telephone Encounter (Signed)
LMOM for pt that Dr Everlene Farrier was very pleased with his report and that he said to keep up the good work!

## 2016-05-31 ENCOUNTER — Ambulatory Visit (INDEPENDENT_AMBULATORY_CARE_PROVIDER_SITE_OTHER): Payer: BLUE CROSS/BLUE SHIELD | Admitting: Internal Medicine

## 2016-05-31 ENCOUNTER — Encounter: Payer: Self-pay | Admitting: Internal Medicine

## 2016-05-31 VITALS — BP 124/73 | HR 62 | Ht 66.5 in | Wt 249.0 lb

## 2016-05-31 DIAGNOSIS — E1165 Type 2 diabetes mellitus with hyperglycemia: Secondary | ICD-10-CM | POA: Diagnosis not present

## 2016-05-31 NOTE — Patient Instructions (Addendum)
Please try to move all Metformin at dinnertime (1000 mg).  Please continue Glipizide 5 mg but move it to 30 min before b'fast.  Please return in 1.5 months with your sugar log.   PATIENT INSTRUCTIONS FOR TYPE 2 DIABETES:  **Please join MyChart!** - see attached instructions about how to join if you have not done so already.  DIET AND EXERCISE Diet and exercise is an important part of diabetic treatment.  We recommended aerobic exercise in the form of brisk walking (working between 40-60% of maximal aerobic capacity, similar to brisk walking) for 150 minutes per week (such as 30 minutes five days per week) along with 3 times per week performing 'resistance' training (using various gauge rubber tubes with handles) 5-10 exercises involving the major muscle groups (upper body, lower body and core) performing 10-15 repetitions (or near fatigue) each exercise. Start at half the above goal but build slowly to reach the above goals. If limited by weight, joint pain, or disability, we recommend daily walking in a swimming pool with water up to waist to reduce pressure from joints while allow for adequate exercise.    BLOOD GLUCOSES Monitoring your blood glucoses is important for continued management of your diabetes. Please check your blood glucoses 2-4 times a day: fasting, before meals and at bedtime (you can rotate these measurements - e.g. one day check before the 3 meals, the next day check before 2 of the meals and before bedtime, etc.).   HYPOGLYCEMIA (low blood sugar) Hypoglycemia is usually a reaction to not eating, exercising, or taking too much insulin/ other diabetes drugs.  Symptoms include tremors, sweating, hunger, confusion, headache, etc. Treat IMMEDIATELY with 15 grams of Carbs: . 4 glucose tablets .  cup regular juice/soda . 2 tablespoons raisins . 4 teaspoons sugar . 1 tablespoon honey Recheck blood glucose in 15 mins and repeat above if still symptomatic/blood glucose  <100.  RECOMMENDATIONS TO REDUCE YOUR RISK OF DIABETIC COMPLICATIONS: * Take your prescribed MEDICATION(S) * Follow a DIABETIC diet: Complex carbs, fiber rich foods, (monounsaturated and polyunsaturated) fats * AVOID saturated/trans fats, high fat foods, >2,300 mg salt per day. * EXERCISE at least 5 times a week for 30 minutes or preferably daily.  * DO NOT SMOKE OR DRINK more than 1 drink a day. * Check your FEET every day. Do not wear tightfitting shoes. Contact us if you develop an ulcer * See your EYE doctor once a year or more if needed * Get a FLU shot once a year * Get a PNEUMONIA vaccine once before and once after age 64 years  GOALS:  * Your Hemoglobin A1c of <7%  * fasting sugars need to be <130 * after meals sugars need to be <180 (2h after you start eating) * Your Systolic BP should be [id number] or lower  * Your Diastolic BP should be 80 or lower  * Your HDL (Good Cholesterol) should be 40 or higher  * Your LDL (Bad Cholesterol) should be 100 or lower. * Your Triglycerides should be 150 or lower  * Your Urine microalbumin (kidney function) should be <30 * Your Body Mass Index should be 25 or lower    Please consider the following ways to cut down carbs and fat and increase fiber and micronutrients in your diet: - substitute whole grain for white bread or pasta - substitute brown rice for white rice - substitute 90-calorie flat bread pieces for slices of bread when possible - substitute sweet potatoes or yams for  white potatoes - substitute humus for margarine - substitute tofu for cheese when possible - substitute almond or rice milk for regular milk (would not drink soy milk daily due to concern for soy estrogen influence on breast cancer risk) - substitute dark chocolate for other sweets when possible - substitute water - can add lemon or orange slices for taste - for diet sodas (artificial sweeteners will trick your body that you can eat sweets without getting calories and  will lead you to overeating and weight gain in the long run) - do not skip breakfast or other meals (this will slow down the metabolism and will result in more weight gain over time)  - can try smoothies made from fruit and almond/rice milk in am instead of regular breakfast - can also try old-fashioned (not instant) oatmeal made with almond/rice milk in am - order the dressing on the side when eating salad at a restaurant (pour less than half of the dressing on the salad) - eat as little meat as possible - can try juicing, but should not forget that juicing will get rid of the fiber, so would alternate with eating raw veg./fruits or drinking smoothies - use as little oil as possible, even when using olive oil - can dress a salad with a mix of balsamic vinegar and lemon juice, for e.g. - use agave nectar, stevia sugar, or regular sugar rather than artificial sweateners - steam or broil/roast veggies  - snack on veggies/fruit/nuts (unsalted, preferably) when possible, rather than processed foods - reduce or eliminate aspartame in diet (it is in diet sodas, chewing gum, etc) Read the labels!  Try to read Dr. Janene Harvey book: "Program for Reversing Diabetes" for other ideas for healthy eating.

## 2016-05-31 NOTE — Progress Notes (Signed)
Patient ID: Rodney Turner, male   DOB: [birth year], 65 y.o.   MRN: [id number]   HPI: Rodney Turner is a 65 y.o.-year-old male, referred by his PCP, Dr. Everlene Farrier, for management of DM2, dx in 03/2016, non-insulin-dependent, uncontrolled, without complications. His wife, Rodney Turner, is also my pt.  He started to feel very fatigued, frequent urination, nocturia, weight loss - end of 01/2016. He went to see PCP >> HbA1c very high. Prev. HbA1c levels were in the 6-7% range.  Last hemoglobin A1c was: Lab Results  Component Value Date   HGBA1C >14.0 04/16/2016   HGBA1C 6.6 01/20/2015   HGBA1C 6.3 05/24/2014   Pt is on a regimen of: - Metformin 500 mg 2x a day, with meals >> had diarrhea when he took it with a larger meal. - Glipizide ER 5 mg 1x a day with b'fast  Pt checks his sugars 1x a day and they are: - am: 125-183 - 2h after b'fast: n/c - before lunch: n/c - 2h after lunch: 230 (after double cheeseburger and fries) - before dinner: 86-119 - 2h after dinner: n/c - bedtime: n/c - nighttime: n/c No lows. Lowest sugar was 86; he has hypoglycemia awareness at 70.  Highest sugar was 230.  Pt's meals are: - Breakfast: [employer] >> burrito - Lunch: grilled chicken sandwich - Dinner: [employer] or [employer] salad or meat + veggies + starch - Snacks: 1: chips, popcorn, fruit cups, fresh fruit He walks 3x a week.  - Last BUN/creatinine:  Lab Results  Component Value Date   BUN 13 04/16/2016   BUN 17 05/24/2014   CREATININE 1.05 04/16/2016   CREATININE 1.02 05/24/2014   - Last set of lipids: Lab Results  Component Value Date   CHOL 138 04/16/2016   HDL 33 (L) 04/16/2016   LDLCALC 73 04/16/2016   TRIG 162 (H) 04/16/2016   CHOLHDL 4.2 04/16/2016  On Lipitor  - started 04/2016. - last eye exam was in 04/2016. No DR. Dr. Sabra Heck. - no numbness and tingling in his feet. On ASA 81.  Pt has FH of DM in PGM.  ROS: Constitutional: + weight loss, + fatigue, no subjective  hyperthermia/hypothermia, + improved nocturia Eyes: + blurry vision, no xerophthalmia ENT: no sore throat, no nodules palpated in throat, no dysphagia/odynophagia, no hoarseness Cardiovascular: no CP/SOB/palpitations/leg swelling Respiratory: no cough/SOB Gastrointestinal: no N/V/D/C Musculoskeletal: no muscle/joint aches Skin: no rashes Neurological: no tremors/numbness/tingling/dizziness Psychiatric: no depression/anxiety  Past Medical History:  Diagnosis Date  . Allergic rhinitis   . Diabetes mellitus ([place]) 05/24/2014  . History of kidney stones 1990's  . Morbid obesity ([place]) 03/17/2014  . OBSTRUCTIVE SLEEP APNEA 02/22/2008   HST 2013:  AHI 51/hr.  Optimal pressure 12cm on autotitration.    . OSA on CPAP    Past Surgical History:  Procedure Laterality Date  . KNEE SURGERY     left   Social History   Social History  . Marital status: Married    Spouse name: N/A  . Number of children: 2   Occupational History  . sales    Social History Main Topics  . Smoking status: Never Smoker  . Smokeless tobacco: Never Used  . Alcohol use No  . Drug use: No   Current Outpatient Prescriptions on File Prior to Visit  Medication Sig Dispense Refill  . aspirin 81 MG tablet Take 81 mg by mouth daily.    Marland Kitchen atorvastatin (LIPITOR) 10 MG tablet Take 1 tablet (10 mg total) by mouth  daily. 90 tablet 3  . fluticasone (FLONASE) 50 MCG/ACT nasal spray Place 2 sprays into both nostrils daily. 16 g 11  . glipiZIDE (GLUCOTROL XL) 5 MG 24 hr tablet Take 1 tablet (5 mg total) by mouth daily with breakfast. 90 tablet 3  . guaiFENesin (MUCINEX) 600 MG 12 hr tablet Take 1,200 mg by mouth 2 (two) times daily as needed for cough or to loosen phlegm.     Marland Kitchen ibuprofen (ADVIL,MOTRIN) 100 MG tablet Take 100 mg by mouth every 6 (six) hours as needed for pain.     . metFORMIN (GLUCOPHAGE) 500 MG tablet Take 1 tablet (500 mg total) by mouth 2 (two) times daily with a meal. 180 tablet 3   No current  facility-administered medications on file prior to visit.    Allergies  Allergen Reactions  . Codeine Other (See Comments)    REACTION: nausea   Family History  Problem Relation Age of Onset  . Snoring Father   . Esophageal varices Father   . Diabetes Paternal Grandmother   . Heart disease Mother   . Gallstones Mother    PE: BP 124/73   Pulse 62   Ht 5' 6.5" (1.689 m)   Wt 249 lb (112.9 kg)   BMI 39.59 kg/m  Wt Readings from Last 3 Encounters:  05/31/16 249 lb (112.9 kg)  04/16/16 248 lb (112.5 kg)  10/20/15 272 lb (123.4 kg)   Constitutional: obese, in NAD Eyes: PERRLA, EOMI, no exophthalmos ENT: moist mucous membranes, no thyromegaly, no cervical lymphadenopathy Cardiovascular: RRR, No MRG Respiratory: CTA B Gastrointestinal: abdomen soft, NT, ND, BS+ Musculoskeletal: no deformities, strength intact in all 4 Skin: moist, warm, no rashes Neurological: + mild tremor with outstretched hands, DTR normal in all 4  ASSESSMENT: 1. DM2, non-insulin-dependent, uncontrolled, without long term complications, but with hyperglycemia  PLAN:  1. Patient with new dx of DM2, uncontrolled (last HbA1c >14%), much improved after starting an oral antidiabetic regimen. He is also working on reading labels, improving diet and walks 3x a week + whenever sugars are higher. He is still having a poor diet and we discussed about how to improve this. He refuses an appt with nutrition for now, to try to improve diet by himself. - sugars are still high in am (although much improved) and he saw that the reason is post-dinner snacks: popcorn, chips, etc. He is working on reducing these. Will also move all Metformin with dinner. - He is taking Glipizide at the start of B'fast >> advised to move it 30 min earlier. - I suggested to:  Patient Instructions  Please try to move all Metformin at dinnertime (1000 mg).  Please continue Glipizide 5 mg but move it to 30 min before b'fast.  Please return in  1.5 months with your sugar log.   - continue checking sugars at different times of the day - check 1-2 times a day, rotating checks - given sugar log and advised how to fill it and to bring it at next appt  - given foot care handout and explained the principles  - given instructions for hypoglycemia management "15-15 rule"  - advised for yearly eye exams >> he is UTD  - Return to clinic in 1.5 mo with sugar log   Philemon Kingdom, MD PhD [facility] Endocr[facility]

## 2016-06-03 ENCOUNTER — Other Ambulatory Visit: Payer: Self-pay

## 2016-06-03 ENCOUNTER — Telehealth: Payer: Self-pay | Admitting: Internal Medicine

## 2016-06-03 MED ORDER — ONETOUCH ULTRASOFT LANCETS MISC: 12 refills | Status: DC

## 2016-06-03 MED ORDER — GLUCOSE BLOOD VI STRP: ORAL_STRIP | 3 refills | Status: DC

## 2016-06-03 MED ORDER — ONETOUCH ULTRA 2 W/DEVICE KIT: PACK | 0 refills | Status: AC

## 2016-06-03 NOTE — Telephone Encounter (Signed)
Pt asking for [place] to call [facility] on [address] for a rx for a new meter and the supplies one touch ultra 2 please lancets are one touch delica and test strips are one touch ultra blue

## 2016-07-24 ENCOUNTER — Encounter: Payer: Self-pay | Admitting: Internal Medicine

## 2016-07-24 ENCOUNTER — Ambulatory Visit (INDEPENDENT_AMBULATORY_CARE_PROVIDER_SITE_OTHER): Payer: BLUE CROSS/BLUE SHIELD | Admitting: Internal Medicine

## 2016-07-24 VITALS — BP 120/71 | HR 60 | Wt 254.0 lb

## 2016-07-24 DIAGNOSIS — E119 Type 2 diabetes mellitus without complications: Secondary | ICD-10-CM

## 2016-07-24 LAB — POCT GLYCOSYLATED HEMOGLOBIN (HGB A1C): HEMOGLOBIN A1C: 5.7

## 2016-07-24 NOTE — Progress Notes (Signed)
Patient ID: Rodney Turner, male   DOB: [birth year], 65 y.o.   MRN: [id number]   HPI: Rodney Turner is a 65 y.o.-year-old male, initially referred by his PCP, Dr. Everlene Farrier, returning for f/u for DM2, dx in 03/2016, non-insulin-dependent, uncontrolled, without complications. His wife, Trevone Prestwood, is also my pt. Last visit 1.5 mo ago.  Reviewed hx: He started to feel very fatigued, frequent urination, nocturia, weight loss - end of 01/2016. He went to see PCP >> HbA1c very high. Prev. HbA1c levels were in the 6-7% range.  Last hemoglobin A1c was: Lab Results  Component Value Date   HGBA1C 5.7 07/24/2016   HGBA1C >14.0 04/16/2016   HGBA1C 6.6 01/20/2015   Pt is on a regimen of: - Metformin 1000 mg with dinner >> had diarrhea when he took it with a larger meal. - Glipizide ER 5 mg 1x a day with b'fast  Pt checks his sugars 1-2x a day and they are: - am: 125-183 >> 123, 141-167, 184 - 2h after b'fast: n/c - before lunch: n/c >> 109, 125 - 2h after lunch: 230 (after double cheeseburger and fries) >> n/c - before dinner: 86-119 >> 84-127, 138 - 2h after dinner: n/c - bedtime: n/c - nighttime: n/c No lows. Lowest sugar was 86 >> 64 x1; he has hypoglycemia awareness at 70.  Highest sugar was 230  >> 184.  Pt's meals are: - Breakfast: McDonalds >> burrito - Lunch: grilled chicken sandwich - Dinner: Chick-fil-a or Wendy's salad or meat + veggies + starch - Snacks: 1: chips, popcorn, fruit cups, fresh fruit He walks 3x a week.  - Last BUN/creatinine:  Lab Results  Component Value Date   BUN 13 04/16/2016   BUN 17 05/24/2014   CREATININE 1.05 04/16/2016   CREATININE 1.02 05/24/2014   - Last set of lipids: Lab Results  Component Value Date   CHOL 138 04/16/2016   HDL 33 (L) 04/16/2016   LDLCALC 73 04/16/2016   TRIG 162 (H) 04/16/2016   CHOLHDL 4.2 04/16/2016  On Lipitor  - started 04/2016. - last eye exam was in 04/2016. No DR. Dr. Sabra Heck. Has astigmatism. - no numbness  and tingling in his feet. On ASA 81.  Pt has FH of DM in PGM.  ROS: Constitutional: no weight loss, no fatigue, no subjective hyperthermia/hypothermia, no nocturia Eyes: no blurry vision, no xerophthalmia ENT: no sore throat, no nodules palpated in throat, no dysphagia/odynophagia, no hoarseness Cardiovascular: no CP/SOB/palpitations/leg swelling Respiratory: no cough/SOB Gastrointestinal: no N/V/+ D/no C Musculoskeletal: no muscle/joint aches Skin: no rashes Neurological: no tremors/numbness/tingling/dizziness Psychiatric: no depression/anxiety  Past Medical History:  Diagnosis Date  . Allergic rhinitis   . Diabetes mellitus ([place]) 05/24/2014  . History of kidney stones 1990's  . Morbid obesity ([place]) 03/17/2014  . OBSTRUCTIVE SLEEP APNEA 02/22/2008   HST 2013:  AHI 51/hr.  Optimal pressure 12cm on autotitration.    . OSA on CPAP    Past Surgical History:  Procedure Laterality Date  . KNEE SURGERY     left   Social History   Social History  . Marital status: Married    Spouse name: N/A  . Number of children: 2   Occupational History  . sales    Social History Main Topics  . Smoking status: Never Smoker  . Smokeless tobacco: Never Used  . Alcohol use No  . Drug use: No   Current Outpatient Prescriptions on File Prior to Visit  Medication Sig Dispense Refill  .  aspirin 81 MG tablet Take 81 mg by mouth daily.    Marland Kitchen atorvastatin (LIPITOR) 10 MG tablet Take 1 tablet (10 mg total) by mouth daily. 90 tablet 3  . Blood Glucose Monitoring Suppl (ONE TOUCH ULTRA 2) w/Device KIT Use to check sugars daily. 1 each 0  . fluticasone (FLONASE) 50 MCG/ACT nasal spray Place 2 sprays into both nostrils daily. 16 g 11  . glipiZIDE (GLUCOTROL XL) 5 MG 24 hr tablet Take 1 tablet (5 mg total) by mouth daily with breakfast. 90 tablet 3  . glucose blood (ONE TOUCH ULTRA TEST) test strip Use as instructed check one time a day. 100 each 3  . guaiFENesin (MUCINEX) 600 MG 12 hr tablet Take 1,200  mg by mouth 2 (two) times daily as needed for cough or to loosen phlegm.     Marland Kitchen ibuprofen (ADVIL,MOTRIN) 100 MG tablet Take 100 mg by mouth every 6 (six) hours as needed for pain.     . Lancets (ONETOUCH ULTRASOFT) lancets Use as instructed check sugar one time daily. 100 each 12  . metFORMIN (GLUCOPHAGE) 500 MG tablet Take 1 tablet (500 mg total) by mouth 2 (two) times daily with a meal. 180 tablet 3   No current facility-administered medications on file prior to visit.    Allergies  Allergen Reactions  . Codeine Other (See Comments)    REACTION: nausea   Family History  Problem Relation Age of Onset  . Snoring Father   . Esophageal varices Father   . Diabetes Paternal Grandmother   . Heart disease Mother   . Gallstones Mother    PE: BP 120/71   Pulse 60   Wt 254 lb (115.2 kg)   BMI 40.38 kg/m  Wt Readings from Last 3 Encounters:  07/24/16 254 lb (115.2 kg)  05/31/16 249 lb (112.9 kg)  04/16/16 248 lb (112.5 kg)   Constitutional: obese, in NAD Eyes: PERRLA, EOMI, no exophthalmos ENT: moist mucous membranes, no thyromegaly, no cervical lymphadenopathy Cardiovascular: RRR, No MRG Respiratory: CTA B Gastrointestinal: abdomen soft, NT, ND, BS+ Musculoskeletal: no deformities, strength intact in all 4 Skin: moist, warm, no rashes Neurological: + mild tremor with outstretched hands, DTR normal in all 4  ASSESSMENT: 1. DM2, non-insulin-dependent, uncontrolled, without long term complications, but with hyperglycemia  PLAN:  1. Patient with new dx of DM2, uncontrolled (last HbA1c >14%), much improved after starting an oral antidiabetic regimen. He also improved his diet since last HbA1c and continues to read labels. He also walks 3x a week + whenever sugars are higher. He still has salty snacks after dinner >> discussed healthier alternatives. I believe that changing his HS snacks will improve his am sugars, which are the highest of the day. Will not change his med regimen for  now. - I suggested to:  Patient Instructions  Please continue: - Metformin at dinnertime (1000 mg). - Glipizide 5 mg 30 min before b'fast.  Please check some sugars at bedtime.  Please adjust your snacks at night.  Please return in 3 months with your sugar log.    - continue checking sugars at different times of the day - check 1-2 times a day, rotating checks - advised for yearly eye exams >> he is UTD  - HbA1c today spectacularly improved, to 5.7%! - Return to clinic in 3 mo with sugar log   Philemon Kingdom, MD PhD [facility] Endocrinology

## 2016-07-24 NOTE — Patient Instructions (Addendum)
Please continue: - Metformin at dinnertime (1000 mg). - Glipizide 5 mg 30 min before b'fast.  Please check some sugars at bedtime.  Please adjust your snacks at night.  Please return in 3 months with your sugar log.

## 2016-10-15 ENCOUNTER — Ambulatory Visit (INDEPENDENT_AMBULATORY_CARE_PROVIDER_SITE_OTHER): Payer: BLUE CROSS/BLUE SHIELD | Admitting: Internal Medicine

## 2016-10-15 ENCOUNTER — Encounter: Payer: Self-pay | Admitting: Internal Medicine

## 2016-10-15 VITALS — BP 140/84 | HR 64 | Ht 67.0 in | Wt 256.0 lb

## 2016-10-15 DIAGNOSIS — E1165 Type 2 diabetes mellitus with hyperglycemia: Secondary | ICD-10-CM | POA: Diagnosis not present

## 2016-10-15 LAB — POCT GLYCOSYLATED HEMOGLOBIN (HGB A1C): HEMOGLOBIN A1C: 5.6

## 2016-10-15 NOTE — Patient Instructions (Addendum)
Please continue: - Metformin 1000 mg at dinnertime - Glipizide ER 5 mg before b'fast   Try to add 1/2 of a Glipizide ER if you have a large dinner or you have dessert after dinner.  Please return in 3 months with your sugar log.

## 2016-10-15 NOTE — Progress Notes (Signed)
Patient ID: LONN IM, male   DOB: 1951/02/25, 66 y.o.   MRN: [id number]   HPI: Rodney Turner is a 66 y.o.-year-old male, initially referred by his PCP, Dr. Everlene Farrier, returning for f/u for DM2, dx in 03/2016, non-insulin-dependent, uncontrolled, without complications. His wife, Rodney Turner, is also my pt. Last visit ~1.5 mo ago.  Since last visit, he joined a gym >> cardio + weights 2-3x a week.  Reviewed hx: In 01/2016, he started to feel very fatigued, frequent urination, nocturia, weight loss - saw PCP >> HbA1c very high (>14%). Prev. HbA1c levels were in the 6-7% range.  Last hemoglobin A1c was much improved at last check: Lab Results  Component Value Date   HGBA1C 5.7 07/24/2016   HGBA1C >14.0 04/16/2016   HGBA1C 6.6 01/20/2015   Pt is on a regimen of: - Metformin 1000 mg with dinner >> had diarrhea when he took it with a larger meal. - Glipizide ER 5 mg 1x a day before b'fast  Pt checks his sugars 1-2x a day and they are: - am: 125-183 >> 123, 141-167, 184 >> 130-170, 177 (higher if snacks at night) - 2h after b'fast: n/c - before lunch: n/c >> 109, 125 >> 135 >> 126, 135 - 2h after lunch: 230 (after double cheeseburger and fries) >> n/c  - before dinner: 86-119 >> 84-127, 138 >> 79-113 - 2h after dinner: n/c >> 120-159 - bedtime: n/c - nighttime: n/c No lows. Lowest sugar was 86 >> 64 x1 >> 79; he has hypoglycemia awareness at 70.  Highest sugar was 230  >> 184 >> 177.   Pt's meals are: - Breakfast: McDonalds >> burrito - Lunch: grilled chicken sandwich - Dinner: Chick-fil-a or Wendy's salad or meat + veggies + starch - Snacks: 1: chips, popcorn, fruit cups, fresh fruit He walks 3x a week.  - Last BUN/creatinine:  Lab Results  Component Value Date   BUN 13 04/16/2016   BUN 17 05/24/2014   CREATININE 1.05 04/16/2016   CREATININE 1.02 05/24/2014   - Last set of lipids: Lab Results  Component Value Date   CHOL 138 04/16/2016   HDL 33 (L) 04/16/2016   LDLCALC 73 04/16/2016   TRIG 162 (H) 04/16/2016   CHOLHDL 4.2 04/16/2016  On Lipitor  - started 04/2016. - last eye exam was in 04/2016. No DR. Dr. Sabra Heck. + astigmatism. - no numbness and tingling in his feet. On ASA 81.  ROS: Constitutional: no weight loss, no fatigue, no subjective hyperthermia/hypothermia, no nocturia Eyes: no blurry vision, no xerophthalmia ENT: no sore throat, no nodules palpated in throat, no dysphagia/odynophagia, no hoarseness Cardiovascular: no CP/SOB/palpitations/leg swelling Respiratory: no cough/SOB Gastrointestinal: no N/V/D/C Musculoskeletal: no muscle/joint aches Skin: no rashes Neurological: no tremors/numbness/tingling/dizziness  I reviewed pt's medications, allergies, PMH, social hx, family hx, and changes were documented in the history of present illness. Otherwise, unchanged from my initial visit note.  Past Medical History:  Diagnosis Date  . Allergic rhinitis   . Diabetes mellitus ([place]) 05/24/2014  . History of kidney stones 1990's  . Morbid obesity ([place]) 03/17/2014  . OBSTRUCTIVE SLEEP APNEA 02/22/2008   HST 2013:  AHI 51/hr.  Optimal pressure 12cm on autotitration.    . OSA on CPAP    Past Surgical History:  Procedure Laterality Date  . KNEE SURGERY     left   Social History   Social History  . Marital status: Married    Spouse name: N/A  . Number of children:  2   Occupational History  . sales    Social History Main Topics  . Smoking status: Never Smoker  . Smokeless tobacco: Never Used  . Alcohol use No  . Drug use: No   Current Outpatient Prescriptions on File Prior to Visit  Medication Sig Dispense Refill  . aspirin 81 MG tablet Take 81 mg by mouth daily.    Marland Kitchen atorvastatin (LIPITOR) 10 MG tablet Take 1 tablet (10 mg total) by mouth daily. 90 tablet 3  . Blood Glucose Monitoring Suppl (ONE TOUCH ULTRA 2) w/Device KIT Use to check sugars daily. 1 each 0  . fluticasone (FLONASE) 50 MCG/ACT nasal spray Place 2 sprays into  both nostrils daily. 16 g 11  . glipiZIDE (GLUCOTROL XL) 5 MG 24 hr tablet Take 1 tablet (5 mg total) by mouth daily with breakfast. 90 tablet 3  . glucose blood (ONE TOUCH ULTRA TEST) test strip Use as instructed check one time a day. 100 each 3  . guaiFENesin (MUCINEX) 600 MG 12 hr tablet Take 1,200 mg by mouth 2 (two) times daily as needed for cough or to loosen phlegm.     Marland Kitchen ibuprofen (ADVIL,MOTRIN) 100 MG tablet Take 100 mg by mouth every 6 (six) hours as needed for pain.     . Lancets (ONETOUCH ULTRASOFT) lancets Use as instructed check sugar one time daily. 100 each 12  . metFORMIN (GLUCOPHAGE) 500 MG tablet Take 1 tablet (500 mg total) by mouth 2 (two) times daily with a meal. 180 tablet 3   No current facility-administered medications on file prior to visit.    Allergies  Allergen Reactions  . Codeine Other (See Comments)    REACTION: nausea   Family History  Problem Relation Age of Onset  . Snoring Father   . Esophageal varices Father   . Diabetes Paternal Grandmother   . Heart disease Mother   . Gallstones Mother    PE: BP 140/84 (BP Location: Left Arm, Patient Position: Sitting)   Pulse 64   Ht 5' 7"  (1.702 m)   Wt 256 lb (116.1 kg)   SpO2 97%   BMI 40.10 kg/m  Wt Readings from Last 3 Encounters:  10/15/16 256 lb (116.1 kg)  07/24/16 254 lb (115.2 kg)  05/31/16 249 lb (112.9 kg)   Constitutional: obese, in NAD Eyes: PERRLA, EOMI, no exophthalmos ENT: moist mucous membranes, no thyromegaly, no cervical lymphadenopathy Cardiovascular: RRR, No MRG Respiratory: CTA B Gastrointestinal: abdomen soft, NT, ND, BS+ Musculoskeletal: no deformities, strength intact in all 4 Skin: moist, warm, no rashes Neurological: no tremor with outstretched hands, DTR normal in all 4  ASSESSMENT: 1. DM2, non-insulin-dependent, uncontrolled, without long term complications, but with hyperglycemia  PLAN:  1. Patient with recent dx of DM2, uncontrolled (last HbA1c >14%), much  improved after starting an oral antidiabetic regimen and improving his diet + exercise. Last HbA1c was 5.7% (!) - He still has snacks after dinner >> sugars in am are higher than target >> discussed about cutting down the snacks and also, if he decides to have something sweet, to eat this aftre dinner >> in this case, he can take an extra 2.5 mg Glipizide.. - I suggested to:  Patient Instructions  Please continue: - Metformin 1000 mg at dinnertime - Glipizide ER 5 mg before b'fast   Try to add 1/2 of a Glipizide ER if you have a large dinner or you have dessert after dinner.  Please return in 3 months with your sugar  log.    - continue checking sugars at different times of the day - check 1x a day, rotating checks - advised for yearly eye exams >> he is UTD  - HbA1c today >> 5.6% (great!) - Return to clinic in 3 mo with sugar log   Philemon Kingdom, MD PhD [facility] Endocrinology

## 2016-11-06 ENCOUNTER — Ambulatory Visit (INDEPENDENT_AMBULATORY_CARE_PROVIDER_SITE_OTHER): Payer: Worker's Compensation

## 2016-11-06 ENCOUNTER — Ambulatory Visit (INDEPENDENT_AMBULATORY_CARE_PROVIDER_SITE_OTHER): Payer: Worker's Compensation | Admitting: Emergency Medicine

## 2016-11-06 VITALS — BP 144/94 | HR 74 | Temp 97.9°F | Resp 17 | Ht 68.0 in | Wt 260.0 lb

## 2016-11-06 DIAGNOSIS — M545 Low back pain, unspecified: Secondary | ICD-10-CM

## 2016-11-06 DIAGNOSIS — S39012A Strain of muscle, fascia and tendon of lower back, initial encounter: Secondary | ICD-10-CM | POA: Diagnosis not present

## 2016-11-06 MED ORDER — DICLOFENAC SODIUM 75 MG PO TBEC: 75.0000 mg | DELAYED_RELEASE_TABLET | Freq: Two times a day (BID) | ORAL | 0 refills | Status: AC

## 2016-11-06 MED ORDER — KETOROLAC TROMETHAMINE 60 MG/2ML IM SOLN
60.0000 mg | Freq: Once | INTRAMUSCULAR | Status: AC
  Administered 2016-11-06: 60 mg via INTRAMUSCULAR

## 2016-11-06 MED ORDER — CYCLOBENZAPRINE HCL 10 MG PO TABS: 10.0000 mg | ORAL_TABLET | Freq: Three times a day (TID) | ORAL | 0 refills | Status: DC | PRN

## 2016-11-06 NOTE — Progress Notes (Signed)
Rodney Turner 66 y.o.   Chief Complaint  Patient presents with  . Back Pain    HISTORY OF PRESENT ILLNESS: This is a 66 y.o. male complaining of pain to left lumbar area since moving heavy box at work last Monday; no other injuries and no other significant symptoms. Back Pain  This is a new problem. The current episode started in the past 7 days. The problem occurs constantly. The problem is unchanged. The pain is present in the lumbar spine. The quality of the pain is described as aching and stabbing (gripping). The pain does not radiate. The pain is at a severity of 6/10. The pain is moderate. The pain is the same all the time. The symptoms are aggravated by bending, position and standing. Pertinent negatives include no abdominal pain, bladder incontinence, bowel incontinence, chest pain, dysuria, fever, headaches, leg pain, numbness, paresthesias, pelvic pain, perianal numbness, tingling, weakness or weight loss. Risk factors: none. He has tried ice and bed rest for the symptoms. The treatment provided mild relief.     Prior to Admission medications   Medication Sig Start Date End Date Taking? Authorizing Provider  aspirin 81 MG tablet Take 81 mg by mouth daily.   Yes Historical Provider, MD  atorvastatin (LIPITOR) 10 MG tablet Take 1 tablet (10 mg total) by mouth daily. 05/13/16  Yes Darlyne Russian, MD  Blood Glucose Monitoring Suppl (ONE TOUCH ULTRA 2) w/Device KIT Use to check sugars daily. 06/03/16  Yes Philemon Kingdom, MD  fluticasone (FLONASE) 50 MCG/ACT nasal spray Place 2 sprays into both nostrils daily. 07/07/13  Yes Thao P Le, DO  glipiZIDE (GLUCOTROL XL) 5 MG 24 hr tablet Take 1 tablet (5 mg total) by mouth daily with breakfast. 04/16/16  Yes Darlyne Russian, MD  glucose blood (ONE TOUCH ULTRA TEST) test strip Use as instructed check one time a day. 06/03/16  Yes Philemon Kingdom, MD  guaiFENesin (MUCINEX) 600 MG 12 hr tablet Take 1,200 mg by mouth 2 (two) times daily as needed  for cough or to loosen phlegm.    Yes Historical Provider, MD  ibuprofen (ADVIL,MOTRIN) 100 MG tablet Take 100 mg by mouth every 6 (six) hours as needed for pain.    Yes Historical Provider, MD  Lancets [facility] ULTRASOFT) lancets Use as instructed check sugar one time daily. 06/03/16  Yes Philemon Kingdom, MD  metFORMIN (GLUCOPHAGE) 500 MG tablet Take 1 tablet (500 mg total) by mouth 2 (two) times daily with a meal. 04/16/16  Yes Darlyne Russian, MD    Allergies  Allergen Reactions  . Codeine Other (See Comments)    REACTION: nausea    Patient Active Problem List   Diagnosis Date Noted  . Hyperlipidemia 06/06/2014  . LBBB (left bundle branch block) 06/06/2014  . Diabetes mellitus ([place]) 05/24/2014  . Morbid obesity ([place]) 03/17/2014  . Obstructive sleep apnea 02/22/2008    Past Medical History:  Diagnosis Date  . Allergic rhinitis   . Diabetes mellitus ([place]) 05/24/2014  . History of kidney stones 1990's  . Morbid obesity ([place]) 03/17/2014  . OBSTRUCTIVE SLEEP APNEA 02/22/2008   HST 2013:  AHI 51/hr.  Optimal pressure 12cm on autotitration.    . OSA on CPAP     Past Surgical History:  Procedure Laterality Date  . KNEE SURGERY     left    Social History   Social History  . Marital status: Married    Spouse name: N/A  . Number of children: N/A  .  Years of education: N/A   Occupational History  . sales    Social History Main Topics  . Smoking status: Never Smoker  . Smokeless tobacco: Never Used  . Alcohol use No  . Drug use: No  . Sexual activity: Yes    Birth control/ protection: None   Other Topics Concern  . Not on file   Social History Narrative  . No narrative on file    Family History  Problem Relation Age of Onset  . Snoring Father   . Esophageal varices Father   . Diabetes Paternal Grandmother   . Heart disease Mother   . Gallstones Mother      Review of Systems  Constitutional: Negative for chills, fever and weight loss.  HENT: Negative.     Eyes: Negative.   Cardiovascular: Negative.  Negative for chest pain, claudication and leg swelling.  Gastrointestinal: Negative for abdominal pain and bowel incontinence.  Genitourinary: Negative for bladder incontinence, dysuria and pelvic pain.  Musculoskeletal: Positive for back pain. Negative for neck pain.  Skin: Negative for rash.  Neurological: Negative for tingling, sensory change, focal weakness, weakness, numbness, headaches and paresthesias.  Endo/Heme/Allergies: Negative.   All other systems reviewed and are negative.  Vitals:   11/06/16 1439  BP: (!) 144/94  Pulse: 74  Resp: 17  Temp: 97.9 F (36.6 C)    Physical Exam  Constitutional: He is oriented to person, place, and time. He appears well-developed.  Obese.  HENT:  Head: Normocephalic and atraumatic.  Nose: Nose normal.  Mouth/Throat: Oropharynx is clear and moist.  Eyes: Conjunctivae and EOM are normal. Pupils are equal, round, and reactive to light.  Neck: Normal range of motion. Neck supple. No JVD present.  Cardiovascular: Normal rate, regular rhythm and normal heart sounds.   Pulmonary/Chest: Effort normal and breath sounds normal.  Abdominal: Soft. Bowel sounds are normal. He exhibits no mass. There is no tenderness.  Musculoskeletal:       Lumbar back: He exhibits tenderness, pain and spasm. He exhibits no bony tenderness.       Back:  Lymphadenopathy:    He has no cervical adenopathy.  Neurological: He is alert and oriented to person, place, and time. He displays normal reflexes. No sensory deficit. He exhibits normal muscle tone.  Skin: Skin is warm. Capillary refill takes less than 2 seconds.  Psychiatric: He has a normal mood and affect. His behavior is normal.  Vitals reviewed.  Xrays reviewed by me: No bony abnormality.  ASSESSMENT & PLAN:  Rodney Turner was seen today for back pain.  Diagnoses and all orders for this visit:  Acute myofascial strain of lumbar region, initial encounter -      ketorolac (TORADOL) injection 60 mg; Inject 2 mLs (60 mg total) into the muscle once. -     DG Lumbar Spine 2-3 Views; Future  Acute left-sided low back pain without sciatica  Other orders -     diclofenac (VOLTAREN) 75 MG EC tablet; Take 1 tablet (75 mg total) by mouth 2 (two) times daily. -     cyclobenzaprine (FLEXERIL) 10 MG tablet; Take 1 tablet (10 mg total) by mouth 3 (three) times daily as needed for muscle spasms.    Patient Instructions       IF you received an x-ray today, you will receive an invoice from [facility] Rad[facility]. Please contact [facility] at [phone] with questions or concerns regarding your invoice.   IF you received labwork today, you will receive an invoice  from [place] [facility]. Please contact [facility] at [phone] with questions or concerns regarding your invoice.   Our billing staff will not be able to assist you with questions regarding bills from these companies.  You will be contacted with the lab results as soon as they are available. The fastest way to get your results is to activate your My Chart account. Instructions are located on the last page of this paperwork. If you have not heard from [place] regarding the results in 2 weeks, please contact this office.      Back Pain, Adult Back pain is very common. The pain often gets better over time. The cause of back pain is usually not dangerous. Most people can learn to manage their back pain on their own. Follow these instructions at home: Watch your back pain for any changes. The following actions may help to lessen any pain you are feeling:  Stay active. Start with short walks on flat ground if you can. Try to walk farther each day.  Exercise regularly as told by your doctor. Exercise helps your back heal faster. It also helps avoid future injury by keeping your muscles strong and flexible.  Do not sit, drive, or stand in one place for more than 30 minutes.  Do not stay in bed. Resting  more than 1-2 days can slow down your recovery.  Be careful when you bend or lift an object. Use good form when lifting:  Bend at your knees.  Keep the object close to your body.  Do not twist.  Sleep on a firm mattress. Lie on your side, and bend your knees. If you lie on your back, put a pillow under your knees.  Take medicines only as told by your doctor.  Put ice on the injured area.  Put ice in a plastic bag.  Place a towel between your skin and the bag.  Leave the ice on for 20 minutes, 2-3 times a day for the first 2-3 days. After that, you can switch between ice and heat packs.  Avoid feeling anxious or stressed. Find good ways to deal with stress, such as exercise.  Maintain a healthy weight. Extra weight puts stress on your back. Contact a doctor if:  You have pain that does not go away with rest or medicine.  You have worsening pain that goes down into your legs or buttocks.  You have pain that does not get better in one week.  You have pain at night.  You lose weight.  You have a fever or chills. Get help right away if:  You cannot control when you poop (bowel movement) or pee (urinate).  Your arms or legs feel weak.  Your arms or legs lose feeling (numbness).  You feel sick to your stomach (nauseous) or throw up (vomit).  You have belly (abdominal) pain.  You feel like you may pass out (faint). This information is not intended to replace advice given to you by your health care provider. Make sure you discuss any questions you have with your health care provider. Document Released: 01/22/2008 Document Revised: 01/11/2016 Document Reviewed: 12/07/2013 Elsevier Interactive Patient Education  2017 [employer]      Agustina Caroli, MD [facility]

## 2016-11-06 NOTE — Patient Instructions (Addendum)
     IF you received an x-ray today, you will receive an invoice from [facility]. Please contact [facility] at [phone] with questions or concerns regarding your invoice.   IF you received labwork today, you will receive an invoice from [place]. Please contact [facility] at [phone] with questions or concerns regarding your invoice.   Our billing staff will not be able to assist you with questions regarding bills from these companies.  You will be contacted with the lab results as soon as they are available. The fastest way to get your results is to activate your My Chart account. Instructions are located on the last page of this paperwork. If you have not heard from [place] regarding the results in 2 weeks, please contact this office.      Back Pain, Adult Back pain is very common. The pain often gets better over time. The cause of back pain is usually not dangerous. Most people can learn to manage their back pain on their own. Follow these instructions at home: Watch your back pain for any changes. The following actions may help to lessen any pain you are feeling:  Stay active. Start with short walks on flat ground if you can. Try to walk farther each day.  Exercise regularly as told by your doctor. Exercise helps your back heal faster. It also helps avoid future injury by keeping your muscles strong and flexible.  Do not sit, drive, or stand in one place for more than 30 minutes.  Do not stay in bed. Resting more than 1-2 days can slow down your recovery.  Be careful when you bend or lift an object. Use good form when lifting:  Bend at your knees.  Keep the object close to your body.  Do not twist.  Sleep on a firm mattress. Lie on your side, and bend your knees. If you lie on your back, put a pillow under your knees.  Take medicines only as told by your doctor.  Put ice on the injured area.  Put ice in a plastic bag.  Place a towel between your  skin and the bag.  Leave the ice on for 20 minutes, 2-3 times a day for the first 2-3 days. After that, you can switch between ice and heat packs.  Avoid feeling anxious or stressed. Find good ways to deal with stress, such as exercise.  Maintain a healthy weight. Extra weight puts stress on your back. Contact a doctor if:  You have pain that does not go away with rest or medicine.  You have worsening pain that goes down into your legs or buttocks.  You have pain that does not get better in one week.  You have pain at night.  You lose weight.  You have a fever or chills. Get help right away if:  You cannot control when you poop (bowel movement) or pee (urinate).  Your arms or legs feel weak.  Your arms or legs lose feeling (numbness).  You feel sick to your stomach (nauseous) or throw up (vomit).  You have belly (abdominal) pain.  You feel like you may pass out (faint). This information is not intended to replace advice given to you by your health care provider. Make sure you discuss any questions you have with your health care provider. Document Released: 01/22/2008 Document Revised: 01/11/2016 Document Reviewed: 12/07/2013 Elsevier Interactive Patient Education  2017 [employer].

## 2016-11-11 ENCOUNTER — Other Ambulatory Visit: Payer: Self-pay | Admitting: Emergency Medicine

## 2016-11-11 ENCOUNTER — Telehealth: Payer: Self-pay | Admitting: Emergency Medicine

## 2016-11-11 MED ORDER — PREDNISONE 20 MG PO TABS: 40.0000 mg | ORAL_TABLET | Freq: Every day | ORAL | 0 refills | Status: AC

## 2016-11-11 MED ORDER — TRAMADOL HCL 50 MG PO TABS: 50.0000 mg | ORAL_TABLET | Freq: Three times a day (TID) | ORAL | 0 refills | Status: DC | PRN

## 2016-11-11 NOTE — Telephone Encounter (Signed)
Next step is to introduce stronger analgesic and steroids. Will prescribe Tramadol and Prednisone.

## 2016-11-11 NOTE — Telephone Encounter (Signed)
Pt advised and rx called to pharmacy

## 2016-11-11 NOTE — Telephone Encounter (Signed)
Pain is ok as long as lying down, occasional twinge.  After being up, even for 1-2 minutes it "tightens up on lower left"  Lays back down and it resolves, its the only thing that helps.  Still using anti inflammatory and relaxer without much relief.  Next step

## 2016-11-11 NOTE — Telephone Encounter (Signed)
Pt is needing to talk with Dr. Mitchel Honour regarding his back pain that he is still having back pain   Best number [phone]

## 2016-11-13 ENCOUNTER — Telehealth: Payer: Self-pay

## 2016-11-13 NOTE — Telephone Encounter (Signed)
PA completed through cover my meds done today.  It was approved and patient notified.  Pharmacy aware.

## 2016-11-18 ENCOUNTER — Telehealth: Payer: Self-pay | Admitting: Emergency Medicine

## 2016-11-18 ENCOUNTER — Other Ambulatory Visit: Payer: Self-pay | Admitting: Emergency Medicine

## 2016-11-18 MED ORDER — DICLOFENAC SODIUM 75 MG PO TBEC: 75.0000 mg | DELAYED_RELEASE_TABLET | Freq: Two times a day (BID) | ORAL | 0 refills | Status: DC

## 2016-11-18 MED ORDER — PREDNISONE 20 MG PO TABS: 40.0000 mg | ORAL_TABLET | Freq: Every day | ORAL | 0 refills | Status: DC

## 2016-11-18 NOTE — Telephone Encounter (Signed)
Pt felt much better on prednisone but now that its done,tightening back again...hard to work. Would like a refill on prednisone and diclofenac if able

## 2016-11-18 NOTE — Telephone Encounter (Signed)
Pt is asking if a nurse could call him back regarding medications.  Please advise [phone]

## 2016-11-18 NOTE — Telephone Encounter (Signed)
Done

## 2016-11-18 NOTE — Telephone Encounter (Signed)
Both no longer on med list? Could you re order and send to pharmacy?

## 2016-11-18 NOTE — Telephone Encounter (Signed)
OK with refill.

## 2016-11-23 ENCOUNTER — Other Ambulatory Visit: Payer: Self-pay | Admitting: Emergency Medicine

## 2016-11-23 ENCOUNTER — Telehealth: Payer: Self-pay | Admitting: Emergency Medicine

## 2016-11-23 MED ORDER — PREDNISONE 20 MG PO TABS: 40.0000 mg | ORAL_TABLET | Freq: Every day | ORAL | 0 refills | Status: AC

## 2016-11-23 NOTE — Telephone Encounter (Signed)
Pt was prescribed refill of prednisone for 5 days last Monday. Pt said he took the last pill yesterday and can already feel pain in back again. Pt requesting another refill for prednisone for this weekend. Wanted to note that he is also coming in on Monday for an appt but wanted to see if he could have a refill for the weekend to help with pain. Please advise. Best pt callback number is [phone].

## 2016-11-23 NOTE — Telephone Encounter (Signed)
Pt advised, has appt. 11/25/16 at 10am

## 2016-11-23 NOTE — Telephone Encounter (Signed)
Looks like hes had 2 short rounds of prednisone since 11/11/16 Please advise

## 2016-11-23 NOTE — Telephone Encounter (Signed)
See prior note from pt.

## 2016-11-23 NOTE — Telephone Encounter (Signed)
Patient needs a recheck. It is unsafe to continue prednisone like this. I will prescribe only 2 additional days worth but he needs to come back to the office with Dr. Mitchel Honour for a re-evaluation.

## 2016-11-25 ENCOUNTER — Ambulatory Visit (INDEPENDENT_AMBULATORY_CARE_PROVIDER_SITE_OTHER): Payer: Worker's Compensation | Admitting: Emergency Medicine

## 2016-11-25 VITALS — BP 129/80 | HR 74 | Temp 97.5°F | Resp 18 | Ht 68.0 in | Wt 253.0 lb

## 2016-11-25 DIAGNOSIS — M5442 Lumbago with sciatica, left side: Secondary | ICD-10-CM

## 2016-11-25 DIAGNOSIS — S40011A Contusion of right shoulder, initial encounter: Secondary | ICD-10-CM | POA: Diagnosis not present

## 2016-11-25 MED ORDER — CYCLOBENZAPRINE HCL 10 MG PO TABS: 10.0000 mg | ORAL_TABLET | Freq: Three times a day (TID) | ORAL | 0 refills | Status: DC | PRN

## 2016-11-25 MED ORDER — TRAMADOL HCL 50 MG PO TABS: 50.0000 mg | ORAL_TABLET | Freq: Three times a day (TID) | ORAL | 0 refills | Status: DC | PRN

## 2016-11-25 NOTE — Progress Notes (Signed)
Rodney Turner 66 y.o.   Chief Complaint  Patient presents with  . Follow-up    back pain  . Shoulder Pain    HISTORY OF PRESENT ILLNESS: This is a 66 y.o. male complaining of persistent left sided low back pain; seen by me 3/21; meds helped to some extent but pain keeps coming back; also fell 1 week ago and injured right shoulder but has recovered FROM by now. Concerned about left lumbar pain radiating to left leg.  Back Pain  This is a recurrent problem. The current episode started 1 to 4 weeks ago. The problem occurs constantly. The problem has been waxing and waning since onset. The pain is present in the lumbar spine. The quality of the pain is described as aching. The pain radiates to the left knee and left thigh. The pain is at a severity of 5/10. The pain is moderate. The pain is the same all the time. The symptoms are aggravated by bending and position. Associated symptoms include paresthesias and tingling. Pertinent negatives include no abdominal pain, bladder incontinence, bowel incontinence, chest pain, dysuria, fever, headaches, leg pain, pelvic pain, perianal numbness or weakness. Risk factors include recent trauma. He has tried analgesics, bed rest and NSAIDs for the symptoms. The treatment provided mild relief.     Prior to Admission medications   Medication Sig Start Date End Date Taking? Authorizing Provider  aspirin 81 MG tablet Take 81 mg by mouth daily.   Yes Historical Provider, MD  atorvastatin (LIPITOR) 10 MG tablet Take 1 tablet (10 mg total) by mouth daily. 05/13/16  Yes Darlyne Russian, MD  Blood Glucose Monitoring Suppl (ONE TOUCH ULTRA 2) w/Device KIT Use to check sugars daily. 06/03/16  Yes Philemon Kingdom, MD  cyclobenzaprine (FLEXERIL) 10 MG tablet Take 1 tablet (10 mg total) by mouth 3 (three) times daily as needed for muscle spasms. 11/06/16  Yes Timmya Blazier Joellen Jersey, MD  fluticasone [facility]) 50 MCG/ACT nasal spray Place 2 sprays into both nostrils daily.  07/07/13  Yes Thao P Le, DO  glipiZIDE (GLUCOTROL XL) 5 MG 24 hr tablet Take 1 tablet (5 mg total) by mouth daily with breakfast. 04/16/16  Yes Darlyne Russian, MD  glucose blood (ONE TOUCH ULTRA TEST) test strip Use as instructed check one time a day. 06/03/16  Yes Philemon Kingdom, MD  guaiFENesin (MUCINEX) 600 MG 12 hr tablet Take 1,200 mg by mouth 2 (two) times daily as needed for cough or to loosen phlegm.    Yes Historical Provider, MD  Lancets [facility] ULTRASOFT) lancets Use as instructed check sugar one time daily. 06/03/16  Yes Philemon Kingdom, MD  metFORMIN (GLUCOPHAGE) 500 MG tablet Take 1 tablet (500 mg total) by mouth 2 (two) times daily with a meal. 04/16/16  Yes Darlyne Russian, MD  traMADol (ULTRAM) 50 MG tablet Take 1 tablet (50 mg total) by mouth every 8 (eight) hours as needed. 11/11/16  Yes [facility], MD  ibuprofen (ADVIL,MOTRIN) 100 MG tablet Take 100 mg by mouth every 6 (six) hours as needed for pain.     Historical Provider, MD  predniSONE (DELTASONE) 20 MG tablet Take 2 tablets (40 mg total) by mouth daily with breakfast. Patient not taking: Reported on 11/25/2016 11/23/16 11/28/16  Jaynee Eagles, PA-C    Allergies  Allergen Reactions  . Codeine Other (See Comments)    REACTION: nausea    Patient Active Problem List   Diagnosis Date Noted  . Acute lumbar myofascial strain 11/06/2016  .  Acute left-sided low back pain without sciatica 11/06/2016  . Hyperlipidemia 06/06/2014  . LBBB (left bundle branch block) 06/06/2014  . Diabetes mellitus ([place]) 05/24/2014  . Morbid obesity ([place]) 03/17/2014  . Obstructive sleep apnea 02/22/2008    Past Medical History:  Diagnosis Date  . Allergic rhinitis   . Diabetes mellitus ([place]) 05/24/2014  . History of kidney stones 1990's  . Morbid obesity ([place]) 03/17/2014  . OBSTRUCTIVE SLEEP APNEA 02/22/2008   HST 2013:  AHI 51/hr.  Optimal pressure 12cm on autotitration.    . OSA on CPAP     Past Surgical History:  Procedure  Laterality Date  . KNEE SURGERY     left    Social History   Social History  . Marital status: Married    Spouse name: N/A  . Number of children: N/A  . Years of education: N/A   Occupational History  . sales    Social History Main Topics  . Smoking status: Never Smoker  . Smokeless tobacco: Never Used  . Alcohol use No  . Drug use: No  . Sexual activity: Yes    Birth control/ protection: None   Other Topics Concern  . Not on file   Social History Narrative  . No narrative on file    Family History  Problem Relation Age of Onset  . Snoring Father   . Esophageal varices Father   . Diabetes Paternal Grandmother   . Heart disease Mother   . Gallstones Mother      Review of Systems  Constitutional: Negative.  Negative for fever.  HENT: Negative.   Eyes: Negative.   Respiratory: Negative for cough and shortness of breath.   Cardiovascular: Negative for chest pain, claudication and leg swelling.  Gastrointestinal: Negative for abdominal pain, bowel incontinence, diarrhea, nausea and vomiting.  Genitourinary: Negative for bladder incontinence, dysuria, frequency, hematuria and pelvic pain.  Musculoskeletal: Positive for back pain and joint pain (right shoulder).  Skin: Negative for rash.  Neurological: Positive for tingling and paresthesias. Negative for dizziness, sensory change, focal weakness, weakness and headaches.  Endo/Heme/Allergies: Negative.   All other systems reviewed and are negative.    Physical Exam  Constitutional: He is oriented to person, place, and time. He appears well-developed.  Obese.  HENT:  Head: Normocephalic and atraumatic.  Nose: Nose normal.  Mouth/Throat: No oropharyngeal exudate.  Eyes: Conjunctivae and EOM are normal. Pupils are equal, round, and reactive to light.  Neck: Normal range of motion. Neck supple.  Cardiovascular: Normal rate, regular rhythm and normal heart sounds.   Pulmonary/Chest: Effort normal and breath  sounds normal.  Abdominal: Soft. He exhibits no distension. There is no tenderness.  Musculoskeletal:       Lumbar back: He exhibits decreased range of motion, pain and spasm.       Back:  Right shoulder: FROM, no bruising or tenderness.  Neurological: He is alert and oriented to person, place, and time. He displays normal reflexes. No sensory deficit. He exhibits normal muscle tone. Coordination normal.  Skin: Skin is warm and dry. Capillary refill takes less than 2 seconds.  Psychiatric: He has a normal mood and affect. His behavior is normal.  Vitals reviewed.    ASSESSMENT & PLAN: Maliki was seen today for follow-up and shoulder pain.  Diagnoses and all orders for this visit:  Acute left-sided low back pain with left-sided sciatica -     Ambulatory referral to Orthopedic Surgery  Contusion of right shoulder, initial encounter  Other  orders -     traMADol (ULTRAM) 50 MG tablet; Take 1 tablet (50 mg total) by mouth every 8 (eight) hours as needed. -     cyclobenzaprine (FLEXERIL) 10 MG tablet; Take 1 tablet (10 mg total) by mouth 3 (three) times daily as needed for muscle spasms.    Patient Instructions       IF you received an x-ray today, you will receive an invoice from [facility]. Please contact [facility] at [phone] with questions or concerns regarding your invoice.   IF you received labwork today, you will receive an invoice from [place]. Please contact [facility] at [phone] with questions or concerns regarding your invoice.   Our billing staff will not be able to assist you with questions regarding bills from these companies.  You will be contacted with the lab results as soon as they are available. The fastest way to get your results is to activate your My Chart account. Instructions are located on the last page of this paperwork. If you have not heard from [place] regarding the results in 2 weeks, please contact this office.      Back  Pain, Adult Back pain is very common. The pain often gets better over time. The cause of back pain is usually not dangerous. Most people can learn to manage their back pain on their own. Follow these instructions at home: Watch your back pain for any changes. The following actions may help to lessen any pain you are feeling:  Stay active. Start with short walks on flat ground if you can. Try to walk farther each day.  Exercise regularly as told by your doctor. Exercise helps your back heal faster. It also helps avoid future injury by keeping your muscles strong and flexible.  Do not sit, drive, or stand in one place for more than 30 minutes.  Do not stay in bed. Resting more than 1-2 days can slow down your recovery.  Be careful when you bend or lift an object. Use good form when lifting:  Bend at your knees.  Keep the object close to your body.  Do not twist.  Sleep on a firm mattress. Lie on your side, and bend your knees. If you lie on your back, put a pillow under your knees.  Take medicines only as told by your doctor.  Put ice on the injured area.  Put ice in a plastic bag.  Place a towel between your skin and the bag.  Leave the ice on for 20 minutes, 2-3 times a day for the first 2-3 days. After that, you can switch between ice and heat packs.  Avoid feeling anxious or stressed. Find good ways to deal with stress, such as exercise.  Maintain a healthy weight. Extra weight puts stress on your back. Contact a doctor if:  You have pain that does not go away with rest or medicine.  You have worsening pain that goes down into your legs or buttocks.  You have pain that does not get better in one week.  You have pain at night.  You lose weight.  You have a fever or chills. Get help right away if:  You cannot control when you poop (bowel movement) or pee (urinate).  Your arms or legs feel weak.  Your arms or legs lose feeling (numbness).  You feel sick to your  stomach (nauseous) or throw up (vomit).  You have belly (abdominal) pain.  You feel like you may pass out (faint). This  information is not intended to replace advice given to you by your health care provider. Make sure you discuss any questions you have with your health care provider. Document Released: 01/22/2008 Document Revised: 01/11/2016 Document Reviewed: 12/07/2013 Elsevier Interactive Patient Education  2017 [employer]      Agustina Caroli, MD [facility]

## 2016-11-25 NOTE — Patient Instructions (Addendum)
     IF you received an x-ray today, you will receive an invoice from [facility]. Please contact [facility] at [phone] with questions or concerns regarding your invoice.   IF you received labwork today, you will receive an invoice from [place]. Please contact [facility] at [phone] with questions or concerns regarding your invoice.   Our billing staff will not be able to assist you with questions regarding bills from these companies.  You will be contacted with the lab results as soon as they are available. The fastest way to get your results is to activate your My Chart account. Instructions are located on the last page of this paperwork. If you have not heard from [place] regarding the results in 2 weeks, please contact this office.      Back Pain, Adult Back pain is very common. The pain often gets better over time. The cause of back pain is usually not dangerous. Most people can learn to manage their back pain on their own. Follow these instructions at home: Watch your back pain for any changes. The following actions may help to lessen any pain you are feeling:  Stay active. Start with short walks on flat ground if you can. Try to walk farther each day.  Exercise regularly as told by your doctor. Exercise helps your back heal faster. It also helps avoid future injury by keeping your muscles strong and flexible.  Do not sit, drive, or stand in one place for more than 30 minutes.  Do not stay in bed. Resting more than 1-2 days can slow down your recovery.  Be careful when you bend or lift an object. Use good form when lifting:  Bend at your knees.  Keep the object close to your body.  Do not twist.  Sleep on a firm mattress. Lie on your side, and bend your knees. If you lie on your back, put a pillow under your knees.  Take medicines only as told by your doctor.  Put ice on the injured area.  Put ice in a plastic bag.  Place a towel between your  skin and the bag.  Leave the ice on for 20 minutes, 2-3 times a day for the first 2-3 days. After that, you can switch between ice and heat packs.  Avoid feeling anxious or stressed. Find good ways to deal with stress, such as exercise.  Maintain a healthy weight. Extra weight puts stress on your back. Contact a doctor if:  You have pain that does not go away with rest or medicine.  You have worsening pain that goes down into your legs or buttocks.  You have pain that does not get better in one week.  You have pain at night.  You lose weight.  You have a fever or chills. Get help right away if:  You cannot control when you poop (bowel movement) or pee (urinate).  Your arms or legs feel weak.  Your arms or legs lose feeling (numbness).  You feel sick to your stomach (nauseous) or throw up (vomit).  You have belly (abdominal) pain.  You feel like you may pass out (faint). This information is not intended to replace advice given to you by your health care provider. Make sure you discuss any questions you have with your health care provider. Document Released: 01/22/2008 Document Revised: 01/11/2016 Document Reviewed: 12/07/2013 Elsevier Interactive Patient Education  2017 [employer].

## 2016-11-29 ENCOUNTER — Other Ambulatory Visit: Payer: Self-pay | Admitting: Emergency Medicine

## 2016-12-01 NOTE — Telephone Encounter (Signed)
11/18/16 last refill

## 2016-12-02 ENCOUNTER — Telehealth: Payer: Self-pay | Admitting: Family Medicine

## 2016-12-02 NOTE — Telephone Encounter (Signed)
Will you write return or needs ov ..f/u?

## 2016-12-02 NOTE — Telephone Encounter (Signed)
PT ALSO WAS CHECKING UP ON STATUS ON REFERRAL

## 2016-12-02 NOTE — Telephone Encounter (Signed)
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

## 2016-12-05 ENCOUNTER — Other Ambulatory Visit: Payer: Self-pay | Admitting: Emergency Medicine

## 2016-12-06 NOTE — Telephone Encounter (Signed)
Filled 12/04/16

## 2016-12-11 ENCOUNTER — Ambulatory Visit (INDEPENDENT_AMBULATORY_CARE_PROVIDER_SITE_OTHER): Payer: Worker's Compensation | Admitting: Emergency Medicine

## 2016-12-11 VITALS — BP 142/84 | HR 75 | Temp 97.8°F | Resp 18 | Ht 68.0 in | Wt 250.6 lb

## 2016-12-11 DIAGNOSIS — S39012D Strain of muscle, fascia and tendon of lower back, subsequent encounter: Secondary | ICD-10-CM | POA: Diagnosis not present

## 2016-12-11 MED ORDER — CYCLOBENZAPRINE HCL 10 MG PO TABS: 10.0000 mg | ORAL_TABLET | Freq: Three times a day (TID) | ORAL | 0 refills | Status: DC | PRN

## 2016-12-11 NOTE — Progress Notes (Signed)
Rodney Turner 66 y.o.   Chief Complaint  Patient presents with  . Follow-up    W/C back pain. Needs release note for work    HISTORY OF PRESENT ILLNESS: This is a 66 y.o. male seen by me last month for low back injury; feeling better with much improved ROM; needs work note. No new problems or medical concerns.  HPI   Prior to Admission medications   Medication Sig Start Date End Date Taking? Authorizing Provider  aspirin 81 MG tablet Take 81 mg by mouth daily.   Yes Historical Provider, MD  atorvastatin (LIPITOR) 10 MG tablet Take 1 tablet (10 mg total) by mouth daily. 05/13/16  Yes Darlyne Russian, MD  Blood Glucose Monitoring Suppl (ONE TOUCH ULTRA 2) w/Device KIT Use to check sugars daily. 06/03/16  Yes Philemon Kingdom, MD  cyclobenzaprine (FLEXERIL) 10 MG tablet Take 1 tablet (10 mg total) by mouth 3 (three) times daily as needed for muscle spasms. 12/11/16  Yes [place], MD  diclofenac (VOLTAREN) 75 MG EC tablet Take 75 mg by mouth 2 (two) times daily.   Yes Historical Provider, MD  fluticasone (FLONASE) 50 MCG/ACT nasal spray Place 2 sprays into both nostrils daily. 07/07/13  Yes Thao P Le, DO  glipiZIDE (GLUCOTROL XL) 5 MG 24 hr tablet Take 1 tablet (5 mg total) by mouth daily with breakfast. 04/16/16  Yes Darlyne Russian, MD  glucose blood (ONE TOUCH ULTRA TEST) test strip Use as instructed check one time a day. 06/03/16  Yes Philemon Kingdom, MD  guaiFENesin (MUCINEX) 600 MG 12 hr tablet Take 1,200 mg by mouth 2 (two) times daily as needed for cough or to loosen phlegm.    Yes Historical Provider, MD  ibuprofen (ADVIL,MOTRIN) 100 MG tablet Take 100 mg by mouth every 6 (six) hours as needed for pain.    Yes Historical Provider, MD  Lancets [facility] ULTRASOFT) lancets Use as instructed check sugar one time daily. 06/03/16  Yes Philemon Kingdom, MD  metFORMIN (GLUCOPHAGE) 500 MG tablet Take 1 tablet (500 mg total) by mouth 2 (two) times daily with a meal. 04/16/16  Yes  Darlyne Russian, MD  traMADol (ULTRAM) 50 MG tablet Take 1 tablet (50 mg total) by mouth every 8 (eight) hours as needed. 11/25/16  Yes [place], MD    Allergies  Allergen Reactions  . Codeine Other (See Comments)    REACTION: nausea    Patient Active Problem List   Diagnosis Date Noted  . Acute left-sided low back pain with left-sided sciatica 11/25/2016  . Contusion of right shoulder 11/25/2016  . Acute lumbar myofascial strain 11/06/2016  . Acute left-sided low back pain without sciatica 11/06/2016  . Hyperlipidemia 06/06/2014  . LBBB (left bundle branch block) 06/06/2014  . Diabetes mellitus ([place]) 05/24/2014  . Morbid obesity ([place]) 03/17/2014  . Obstructive sleep apnea 02/22/2008    Past Medical History:  Diagnosis Date  . Allergic rhinitis   . Diabetes mellitus ([place]) 05/24/2014  . History of kidney stones 1990's  . Morbid obesity ([place]) 03/17/2014  . OBSTRUCTIVE SLEEP APNEA 02/22/2008   HST 2013:  AHI 51/hr.  Optimal pressure 12cm on autotitration.    . OSA on CPAP     Past Surgical History:  Procedure Laterality Date  . KNEE SURGERY     left    Social History   Social History  . Marital status: Married    Spouse name: N/A  . Number of children: N/A  .  Years of education: N/A   Occupational History  . sales    Social History Main Topics  . Smoking status: Never Smoker  . Smokeless tobacco: Never Used  . Alcohol use No  . Drug use: No  . Sexual activity: Yes    Birth control/ protection: None   Other Topics Concern  . Not on file   Social History Narrative  . No narrative on file    Family History  Problem Relation Age of Onset  . Snoring Father   . Esophageal varices Father   . Diabetes Paternal Grandmother   . Heart disease Mother   . Gallstones Mother      Review of Systems  Constitutional: Negative.  Negative for chills and fever.  HENT: Negative.   Eyes: Negative.   Respiratory: Negative.  Negative for shortness of breath.     Cardiovascular: Negative.  Negative for chest pain and palpitations.  Gastrointestinal: Negative.  Negative for abdominal pain, diarrhea, nausea and vomiting.  Genitourinary: Negative.  Negative for hematuria.  Musculoskeletal: Positive for back pain (minimal and intermittent). Negative for joint pain.  Skin: Negative.  Negative for rash.  Neurological: Negative for dizziness, sensory change, focal weakness and headaches.  Endo/Heme/Allergies: Negative.   All other systems reviewed and are negative.  Vitals:   12/11/16 0909  BP: (!) 142/84  Pulse: 75  Resp: 18  Temp: 97.8 F (36.6 C)    Physical Exam  Constitutional: He is oriented to person, place, and time. He appears well-developed and well-nourished.  HENT:  Head: Normocephalic and atraumatic.  Eyes: EOM are normal. Pupils are equal, round, and reactive to light.  Neck: Normal range of motion. Neck supple.  Cardiovascular: Normal rate and regular rhythm.   Pulmonary/Chest: Effort normal and breath sounds normal.  Abdominal: Soft. There is no tenderness.  Musculoskeletal:       Lumbar back: He exhibits normal range of motion, no tenderness, no bony tenderness, no swelling, no pain and no spasm.  Neurological: He is alert and oriented to person, place, and time. No sensory deficit. He exhibits normal muscle tone.  Skin: Skin is warm and dry. Capillary refill takes less than 2 seconds.  Psychiatric: He has a normal mood and affect. His behavior is normal.  Vitals reviewed.    ASSESSMENT & PLAN: Rodney Turner was seen today for follow-up.  Diagnoses and all orders for this visit:  Acute myofascial strain of lumbar region, subsequent encounter Comments: much improved  Other orders -     cyclobenzaprine (FLEXERIL) 10 MG tablet; Take 1 tablet (10 mg total) by mouth 3 (three) times daily as needed for muscle spasms.    Patient Instructions  Back Pain, Adult Back pain is very common. The pain often gets better over time. The  cause of back pain is usually not dangerous. Most people can learn to manage their back pain on their own. Follow these instructions at home: Watch your back pain for any changes. The following actions may help to lessen any pain you are feeling:  Stay active. Start with short walks on flat ground if you can. Try to walk farther each day.  Exercise regularly as told by your doctor. Exercise helps your back heal faster. It also helps avoid future injury by keeping your muscles strong and flexible.  Do not sit, drive, or stand in one place for more than 30 minutes.  Do not stay in bed. Resting more than 1-2 days can slow down your recovery.  Be careful  when you bend or lift an object. Use good form when lifting:  Bend at your knees.  Keep the object close to your body.  Do not twist.  Sleep on a firm mattress. Lie on your side, and bend your knees. If you lie on your back, put a pillow under your knees.  Take medicines only as told by your doctor.  Put ice on the injured area.  Put ice in a plastic bag.  Place a towel between your skin and the bag.  Leave the ice on for 20 minutes, 2-3 times a day for the first 2-3 days. After that, you can switch between ice and heat packs.  Avoid feeling anxious or stressed. Find good ways to deal with stress, such as exercise.  Maintain a healthy weight. Extra weight puts stress on your back. Contact a doctor if:  You have pain that does not go away with rest or medicine.  You have worsening pain that goes down into your legs or buttocks.  You have pain that does not get better in one week.  You have pain at night.  You lose weight.  You have a fever or chills. Get help right away if:  You cannot control when you poop (bowel movement) or pee (urinate).  Your arms or legs feel weak.  Your arms or legs lose feeling (numbness).  You feel sick to your stomach (nauseous) or throw up (vomit).  You have belly (abdominal)  pain.  You feel like you may pass out (faint). This information is not intended to replace advice given to you by your health care provider. Make sure you discuss any questions you have with your health care provider. Document Released: 01/22/2008 Document Revised: 01/11/2016 Document Reviewed: 12/07/2013 Elsevier Interactive Patient Education  2017 Elsevier Inc.      Agustina Caroli, MD [facility]

## 2016-12-11 NOTE — Patient Instructions (Signed)
Back Pain, Adult  Back pain is very common. The pain often gets better over time. The cause of back pain is usually not dangerous. Most people can learn to manage their back pain on their own.  Follow these instructions at home:  Watch your back pain for any changes. The following actions may help to lessen any pain you are feeling:  · Stay active. Start with short walks on flat ground if you can. Try to walk farther each day.  · Exercise regularly as told by your doctor. Exercise helps your back heal faster. It also helps avoid future injury by keeping your muscles strong and flexible.  · Do not sit, drive, or stand in one place for more than 30 minutes.  · Do not stay in bed. Resting more than 1-2 days can slow down your recovery.  · Be careful when you bend or lift an object. Use good form when lifting:  ? Bend at your knees.  ? Keep the object close to your body.  ? Do not twist.  · Sleep on a firm mattress. Lie on your side, and bend your knees. If you lie on your back, put a pillow under your knees.  · Take medicines only as told by your doctor.  · Put ice on the injured area.  ? Put ice in a plastic bag.  ? Place a towel between your skin and the bag.  ? Leave the ice on for 20 minutes, 2-3 times a day for the first 2-3 days. After that, you can switch between ice and heat packs.  · Avoid feeling anxious or stressed. Find good ways to deal with stress, such as exercise.  · Maintain a healthy weight. Extra weight puts stress on your back.    Contact a doctor if:  · You have pain that does not go away with rest or medicine.  · You have worsening pain that goes down into your legs or buttocks.  · You have pain that does not get better in one week.  · You have pain at night.  · You lose weight.  · You have a fever or chills.  Get help right away if:  · You cannot control when you poop (bowel movement) or pee (urinate).  · Your arms or legs feel weak.  · Your arms or legs lose feeling (numbness).  · You feel sick  to your stomach (nauseous) or throw up (vomit).  · You have belly (abdominal) pain.  · You feel like you may pass out (faint).  This information is not intended to replace advice given to you by your health care provider. Make sure you discuss any questions you have with your health care provider.  Document Released: 01/22/2008 Document Revised: 01/11/2016 Document Reviewed: 12/07/2013  Elsevier Interactive Patient Education © 2017 Elsevier Inc.

## 2016-12-14 ENCOUNTER — Telehealth: Payer: Self-pay | Admitting: Emergency Medicine

## 2016-12-14 NOTE — Telephone Encounter (Signed)
Pt is needing to talk with sagardia to get a refill on tramadol  Best number [phone]

## 2016-12-16 ENCOUNTER — Other Ambulatory Visit: Payer: Self-pay | Admitting: Emergency Medicine

## 2016-12-16 MED ORDER — TRAMADOL HCL 50 MG PO TABS: 50.0000 mg | ORAL_TABLET | Freq: Three times a day (TID) | ORAL | 0 refills | Status: DC | PRN

## 2016-12-16 NOTE — Telephone Encounter (Signed)
Ok for refill? 

## 2016-12-16 NOTE — Telephone Encounter (Signed)
Rx is pended. Please sign, then route to p pcp RX refill

## 2016-12-20 ENCOUNTER — Other Ambulatory Visit: Payer: Self-pay | Admitting: Emergency Medicine

## 2016-12-21 NOTE — Telephone Encounter (Signed)
12/11/16 last refill

## 2016-12-26 ENCOUNTER — Other Ambulatory Visit: Payer: Self-pay | Admitting: Family Medicine

## 2016-12-30 ENCOUNTER — Other Ambulatory Visit: Payer: Self-pay | Admitting: Family Medicine

## 2016-12-30 NOTE — Telephone Encounter (Signed)
Pt saw them last week and will f/u with them for meds

## 2016-12-30 NOTE — Telephone Encounter (Signed)
Last refill 4/30 for 30 tabs; pain not well controlled if frequent use necessary; needs Ortho evaluation before renewal.

## 2016-12-30 NOTE — Telephone Encounter (Signed)
12/16/16 last refill Only has seen dr Mitchel Honour

## 2017-01-15 ENCOUNTER — Ambulatory Visit: Payer: BLUE CROSS/BLUE SHIELD | Admitting: Internal Medicine

## 2017-01-15 ENCOUNTER — Ambulatory Visit (INDEPENDENT_AMBULATORY_CARE_PROVIDER_SITE_OTHER): Payer: BLUE CROSS/BLUE SHIELD | Admitting: Internal Medicine

## 2017-01-15 ENCOUNTER — Encounter: Payer: Self-pay | Admitting: Internal Medicine

## 2017-01-15 VITALS — BP 124/80 | HR 66 | Ht 68.0 in | Wt 261.0 lb

## 2017-01-15 DIAGNOSIS — E1165 Type 2 diabetes mellitus with hyperglycemia: Secondary | ICD-10-CM

## 2017-01-15 LAB — POCT GLYCOSYLATED HEMOGLOBIN (HGB A1C): Hemoglobin A1C: 5.9

## 2017-01-15 NOTE — Addendum Note (Signed)
Addended by: Caprice Beaver T on: 01/15/2017 09:32 AM   Modules accepted: Orders

## 2017-01-15 NOTE — Progress Notes (Signed)
Patient ID: Rodney Turner, male   DOB: [birth year], 66 y.o.   MRN: [id number]   HPI: Rodney Turner is a 66 y.o.-year-old male, initially referred by his PCP, Dr. Everlene Farrier, returning for f/u for DM2, dx in 03/2016, non-insulin-dependent, uncontrolled, without complications. His wife, Rodney Turner, is also my pt. Last OV 3 mo ago.  He hurt his back 2 mo ago >> cannot exercise. He was on a steroid taper. He is trying to get workers comp. He is seeing [place].  Reviewed hx: In 01/2016, he started to feel very fatigued, frequent urination, nocturia, weight loss - saw PCP >> HbA1c very high (>14%). Prev. HbA1c levels were in the 6-7% range.  Last hemoglobin A1c: Lab Results  Component Value Date   HGBA1C 5.6 10/15/2016   HGBA1C 5.7 07/24/2016   HGBA1C >14.0 04/16/2016   Pt is on a regimen of: - Metformin 1000 mg with dinner >> had diarrhea when he took it with a larger meal. - Glipizide ER 5 mg 1x a day before b'fast  Pt checks his sugars 1-2x a day and they are: - am: 125-183 >> 123, 141-167, 184 >> 130-170, 177 (higher if snacks at night) >> 108-169 - 2h after b'fast: n/c - before lunch: n/c >> 109, 125 >> 135 >> 126, 135 >> 126 - 2h after lunch: 230 (after double cheeseburger and fries) >> n/c  - before dinner: 86-119 >> 84-127, 138 >> 79-113 >> 75, 101-124, 145 ([facility]) - 2h after dinner: n/c >> 120-159 >> n/c - bedtime: n/c >> 149 - nighttime: n/c No lows. Lowest sugar was 86 >> 64 x1 >> 79 >> 75; he has hypoglycemia awareness at 70.  Highest sugar was 230  >> 184 >> 177 >> 169.   Pt's meals are: - Breakfast: [employer] >> burrito - Lunch: grilled chicken sandwich - Dinner: [employer] or [employer] salad or meat + veggies + starch - Snacks: 1: chips, popcorn, fruit cups, fresh fruit He stopped going to the gym since her hurt his back.  - Npo CKD. Last BUN/creatinine:  Lab Results  Component Value Date   BUN 13 04/16/2016   BUN 17 05/24/2014   CREATININE 1.05  04/16/2016   CREATININE 1.02 05/24/2014   - Last set of lipids: Lab Results  Component Value Date   CHOL 138 04/16/2016   HDL 33 (L) 04/16/2016   LDLCALC 73 04/16/2016   TRIG 162 (H) 04/16/2016   CHOLHDL 4.2 04/16/2016  On Lipitor. - last eye exam was in 04/2016. No DR.. Dr. Sabra Heck. + astigmatism. - he denies numbness and tingling in his feet. On ASA 81.  ROS: Constitutional: + weight gain, no fatigue, no subjective hyperthermia, no subjective hypothermia Eyes: no blurry vision, no xerophthalmia ENT: no sore throat, no nodules palpated in throat, no dysphagia, no odynophagia, no hoarseness Cardiovascular: no CP/no SOB/no palpitations/no leg swelling Respiratory: no cough/no SOB/no wheezing Gastrointestinal: no N/no V/no D/no C/no acid reflux Musculoskeletal: + muscle aches/+ joint aches Skin: no rashes, no hair loss Neurological: no tremors/no numbness/no tingling/no dizziness  I reviewed pt's medications, allergies, PMH, social hx, family hx, and changes were documented in the history of present illness. Otherwise, unchanged from my initial visit note.   Past Medical History:  Diagnosis Date  . Allergic rhinitis   . Diabetes mellitus ([place]) 05/24/2014  . History of kidney stones 1990's  . Morbid obesity ([place]) 03/17/2014  . OBSTRUCTIVE SLEEP APNEA 02/22/2008   HST 2013:  AHI 51/hr.  Optimal  pressure 12cm on autotitration.    . OSA on CPAP    Past Surgical History:  Procedure Laterality Date  . KNEE SURGERY     left   Social History   Social History  . Marital status: Married    Spouse name: N/A  . Number of children: 2   Occupational History  . sales    Social History Main Topics  . Smoking status: Never Smoker  . Smokeless tobacco: Never Used  . Alcohol use No  . Drug use: No   Current Outpatient Prescriptions on File Prior to Visit  Medication Sig Dispense Refill  . aspirin 81 MG tablet Take 81 mg by mouth daily.    Marland Kitchen atorvastatin (LIPITOR) 10 MG tablet  Take 1 tablet (10 mg total) by mouth daily. 90 tablet 3  . Blood Glucose Monitoring Suppl (ONE TOUCH ULTRA 2) w/Device KIT Use to check sugars daily. 1 each 0  . fluticasone (FLONASE) 50 MCG/ACT nasal spray Place 2 sprays into both nostrils daily. 16 g 11  . glipiZIDE (GLUCOTROL XL) 5 MG 24 hr tablet Take 1 tablet (5 mg total) by mouth daily with breakfast. 90 tablet 3  . glucose blood (ONE TOUCH ULTRA TEST) test strip Use as instructed check one time a day. 100 each 3  . guaiFENesin (MUCINEX) 600 MG 12 hr tablet Take 1,200 mg by mouth 2 (two) times daily as needed for cough or to loosen phlegm.     Marland Kitchen ibuprofen (ADVIL,MOTRIN) 100 MG tablet Take 100 mg by mouth every 6 (six) hours as needed for pain.     . Lancets (ONETOUCH ULTRASOFT) lancets Use as instructed check sugar one time daily. 100 each 12  . metFORMIN (GLUCOPHAGE) 500 MG tablet Take 1 tablet (500 mg total) by mouth 2 (two) times daily with a meal. 180 tablet 3  . cyclobenzaprine (FLEXERIL) 10 MG tablet TAKE 1 TABLET (10 MG TOTAL) BY MOUTH 3 (THREE) TIMES DAILY AS NEEDED FOR MUSCLE SPASMS. (Patient not taking: Reported on 01/15/2017) 30 tablet 0  . diclofenac (VOLTAREN) 75 MG EC tablet Take 75 mg by mouth 2 (two) times daily.    . traMADol (ULTRAM) 50 MG tablet Take 1 tablet (50 mg total) by mouth every 8 (eight) hours as needed. (Patient not taking: Reported on 01/15/2017) 30 tablet 0   No current facility-administered medications on file prior to visit.    Allergies  Allergen Reactions  . Codeine Other (See Comments)    REACTION: nausea   Family History  Problem Relation Age of Onset  . Snoring Father   . Esophageal varices Father   . Diabetes Paternal Grandmother   . Heart disease Mother   . Gallstones Mother    PE: BP 124/80 (BP Location: Left Arm, Patient Position: Sitting)   Pulse 66   Ht 5' 8"  (1.727 m)   Wt 261 lb (118.4 kg)   SpO2 98%   BMI 39.68 kg/m   Wt Readings from Last 3 Encounters:  01/15/17 261 lb (118.4  kg)  12/11/16 250 lb 9.6 oz (113.7 kg)  11/25/16 253 lb (114.8 kg)   Constitutional: obese, in NAD Eyes: PERRLA, EOMI, no exophthalmos ENT: moist mucous membranes, no thyromegaly, no cervical lymphadenopathy Cardiovascular: RRR, No MRG, + B LE pitting edema Respiratory: CTA B Gastrointestinal: abdomen soft, NT, ND, BS+ Musculoskeletal: no deformities, strength intact in all 4 Skin: moist, warm, no rashes Neurological: no tremor with outstretched hands, DTR normal in all 4   ASSESSMENT: 1.  DM2, non-insulin-dependent, uncontrolled, without long term complications, but with hyperglycemia  PLAN:  1. Patient with relatively recent DM2 dx, with much improved control in last few months. At last visit, he started going to the gym >> sugars were great. They are a little higher now, as hhe hurt his back >> stopped going to the gym. - he is using a small dose of Glipizide before a a larger meal  - not having to do this often - he may forget Metformin at dinnertime >> advised him t put it on the kitchen table - I suggested to:  Patient Instructions  Please continue: - Metformin 1000 mg at dinnertime - Glipizide ER 5 mg before b'fast + 1/2 of a Glipizide ER if you have a large dinner or you have dessert after dinner.  Please return in 4 months with your sugar log.    - today, HbA1c is 5.9%  - continue checking sugars at different times of the day - check 1x a day, rotating checks - advised for yearly eye exams >> he is UTD - Return to clinic in 4 mo with sugar log     Philemon Kingdom, MD PhD [facility] Endocrinology

## 2017-01-15 NOTE — Patient Instructions (Addendum)
Please continue: - Metformin 1000 mg at dinnertime - Glipizide ER 5 mg before b'fast + 1/2 of a Glipizide ER if you have a large dinner or you have dessert after dinner.  Please return in 4 months with your sugar log.

## 2017-03-06 ENCOUNTER — Ambulatory Visit: Payer: BLUE CROSS/BLUE SHIELD | Admitting: Internal Medicine

## 2017-03-31 ENCOUNTER — Other Ambulatory Visit: Payer: Self-pay | Admitting: Emergency Medicine

## 2017-04-02 NOTE — Telephone Encounter (Signed)
PHARMACY CALLING ABOUT PT REFILL ON METFORMIN AND GLIPIZIDE

## 2017-04-03 ENCOUNTER — Other Ambulatory Visit: Payer: Self-pay

## 2017-04-03 ENCOUNTER — Telehealth: Payer: Self-pay | Admitting: Internal Medicine

## 2017-04-03 MED ORDER — METFORMIN HCL 500 MG PO TABS: 500.0000 mg | ORAL_TABLET | Freq: Two times a day (BID) | ORAL | 3 refills | Status: DC

## 2017-04-03 MED ORDER — GLIPIZIDE ER 5 MG PO TB24: 5.0000 mg | ORAL_TABLET | Freq: Every day | ORAL | 3 refills | Status: DC

## 2017-04-03 MED ORDER — ONETOUCH ULTRASOFT LANCETS MISC: 12 refills | Status: DC

## 2017-04-03 MED ORDER — GLUCOSE BLOOD VI STRP: ORAL_STRIP | 3 refills | Status: DC

## 2017-04-03 NOTE — Telephone Encounter (Signed)
Patient called in reference to getting Dr. Renne Crigler to start filling all future prescriptions. Patient's primary care provider has retired and patient would like to know what needs to be done. Please call patient and advise. OK to leave message.

## 2017-04-03 NOTE — Telephone Encounter (Signed)
Called patient and advised that we would refill the medications that she prescribed, patient understood. I submitted the refills needed.

## 2017-05-06 ENCOUNTER — Other Ambulatory Visit: Payer: Self-pay | Admitting: *Deleted

## 2017-05-06 DIAGNOSIS — E119 Type 2 diabetes mellitus without complications: Secondary | ICD-10-CM

## 2017-05-06 MED ORDER — ATORVASTATIN CALCIUM 10 MG PO TABS: 10.0000 mg | ORAL_TABLET | Freq: Every day | ORAL | 0 refills | Status: DC

## 2017-05-19 ENCOUNTER — Encounter: Payer: Self-pay | Admitting: Internal Medicine

## 2017-05-19 ENCOUNTER — Ambulatory Visit (INDEPENDENT_AMBULATORY_CARE_PROVIDER_SITE_OTHER): Payer: BLUE CROSS/BLUE SHIELD | Admitting: Internal Medicine

## 2017-05-19 VITALS — BP 134/78 | HR 80 | Ht 67.5 in | Wt 254.0 lb

## 2017-05-19 DIAGNOSIS — E1165 Type 2 diabetes mellitus with hyperglycemia: Secondary | ICD-10-CM | POA: Diagnosis not present

## 2017-05-19 LAB — MICROALBUMIN / CREATININE URINE RATIO
CREATININE, U: 272.5 mg/dL
MICROALB/CREAT RATIO: 0.6 mg/g (ref 0.0–30.0)
Microalb, Ur: 1.5 mg/dL (ref 0.0–1.9)

## 2017-05-19 LAB — COMPLETE METABOLIC PANEL WITH GFR
AG RATIO: 1.5 (calc) (ref 1.0–2.5)
ALT: 22 U/L (ref 9–46)
AST: 19 U/L (ref 10–35)
Albumin: 4 g/dL (ref 3.6–5.1)
Alkaline phosphatase (APISO): 86 U/L (ref 40–115)
BILIRUBIN TOTAL: 0.3 mg/dL (ref 0.2–1.2)
BUN: 14 mg/dL (ref 7–25)
CHLORIDE: 107 mmol/L (ref 98–110)
CO2: 26 mmol/L (ref 20–32)
Calcium: 9.5 mg/dL (ref 8.6–10.3)
Creat: 1.13 mg/dL (ref 0.70–1.25)
GFR, Est African American: 78 mL/min/{1.73_m2} (ref >=60)
GFR, Est Non African American: 67 mL/min/{1.73_m2} (ref >=60)
GLOBULIN: 2.6 g/dL (ref 1.9–3.7)
Glucose, Bld: 118 mg/dL — ABNORMAL HIGH (ref 65–99)
POTASSIUM: 4.7 mmol/L (ref 3.5–5.3)
SODIUM: 141 mmol/L (ref 135–146)
Total Protein: 6.6 g/dL (ref 6.1–8.1)

## 2017-05-19 LAB — LIPID PANEL
Cholesterol: 92 mg/dL (ref 0–200)
HDL: 32.6 mg/dL — AB (ref >39.00)
LDL Cholesterol: 24 mg/dL (ref 0–99)
NONHDL: 59.57
TRIGLYCERIDES: 179 mg/dL — AB (ref 0.0–149.0)
Total CHOL/HDL Ratio: 3
VLDL: 35.8 mg/dL (ref 0.0–40.0)

## 2017-05-19 LAB — POCT GLYCOSYLATED HEMOGLOBIN (HGB A1C): HEMOGLOBIN A1C: 5.5

## 2017-05-19 MED ORDER — GLIPIZIDE ER 5 MG PO TB24: ORAL_TABLET | ORAL | 3 refills | Status: DC

## 2017-05-19 NOTE — Progress Notes (Signed)
Patient ID: Rodney Turner, male   DOB: 09-06-1950, 66 y.o.   MRN: [id number]   HPI: Rodney Turner is a 66 y.o.-year-old male, initially referred by his PCP, Dr. Everlene Farrier, returning for f/u for DM2, dx in 03/2016, non-insulin-dependent, uncontrolled, without long term complications. His wife, Rodney Turner, is also my pt. Last OV 4 mo ago.  Lost 7 lbs since last visit.  Reviewed hx: In 01/2016, he started to feel very fatigued, frequent urination, nocturia, weight loss - saw PCP >> HbA1c very high (>14%). Prev. HbA1c levels were in the 6-7% range.  Last hemoglobin A1c: Lab Results  Component Value Date   HGBA1C 5.9 01/15/2017   HGBA1C 5.6 10/15/2016   HGBA1C 5.7 07/24/2016   Pt is on a regimen of: - Metformin 1000 mg at dinnertime - Glipizide ER 5 mg before b'fast + 1/2 of a Glipizide ER if you have a large dinner or you have dessert after dinner.  Pt checks his sugars 1-2x a day and they are: - am:  130-170, 177 (higher if snacks at night) >> 108-169 >> 124, 179 - 2h after b'fast: n/c - before lunch: n/c >> 109, 125 >> 135 >> 126, 135 >> 126 >> n/c - 2h after lunch: 230 (after double cheeseburger and fries) >> n/c  - before dinner:  79-113 >> 75, 101-124, 145 ([facility]) >> 82-107 - 2h after dinner: n/c >> 120-159 >> n/c - bedtime: n/c >> 149 - nighttime: n/c Lowest sugar was 75; he has hypoglycemia awareness at 70.  Highest sugar was 169.   Pt's meals are: - Breakfast: [employer] >> burrito - Lunch: grilled chicken sandwich - Dinner: [employer] or [employer] salad or meat + veggies + starch - Snacks: 1: chips, popcorn, fruit cups, fresh fruit He stopped going to the gym since her hurt his back.  - No CKD. Last BUN/creatinine:  Lab Results  Component Value Date   BUN 13 04/16/2016   BUN 17 05/24/2014   CREATININE 1.05 04/16/2016   CREATININE 1.02 05/24/2014   - Last set of lipids: Lab Results  Component Value Date   CHOL 138 04/16/2016   HDL 33 (L) 04/16/2016    LDLCALC 73 04/16/2016   TRIG 162 (H) 04/16/2016   CHOLHDL 4.2 04/16/2016  On Lipitor 10. - last eye exam was in 04/2016 >> No DR. Dr. Sabra Heck. + astigmatism. - denies numbness and tingling in his feet. On ASA 81.  He has OSA.  ROS: Constitutional: + weight loss, no fatigue, no subjective hyperthermia, no subjective hypothermia Eyes: no blurry vision, no xerophthalmia ENT: no sore throat, no nodules palpated in throat, no dysphagia, no odynophagia, no hoarseness Cardiovascular: no CP/no SOB/no palpitations/no leg swelling Respiratory: no cough/no SOB/no wheezing Gastrointestinal: no N/no V/no D/no C/no acid reflux Musculoskeletal: no muscle aches/no joint aches Skin: no rashes, no hair loss Neurological: no tremors/no numbness/no tingling/no dizziness  I reviewed pt's medications, allergies, PMH, social hx, family hx, and changes were documented in the history of present illness. Otherwise, unchanged from my initial visit note.   Past Medical History:  Diagnosis Date  . Allergic rhinitis   . Diabetes mellitus ([place]) 05/24/2014  . History of kidney stones 1990's  . Morbid obesity ([place]) 03/17/2014  . OBSTRUCTIVE SLEEP APNEA 02/22/2008   HST 2013:  AHI 51/hr.  Optimal pressure 12cm on autotitration.    . OSA on CPAP    Past Surgical History:  Procedure Laterality Date  . KNEE SURGERY  left   Social History   Social History  . Marital status: Married    Spouse name: N/A  . Number of children: 2   Occupational History  . sales    Social History Main Topics  . Smoking status: Never Smoker  . Smokeless tobacco: Never Used  . Alcohol use No  . Drug use: No   Current Outpatient Prescriptions on File Prior to Visit  Medication Sig Dispense Refill  . aspirin 81 MG tablet Take 81 mg by mouth daily.    Marland Kitchen atorvastatin (LIPITOR) 10 MG tablet Take 1 tablet (10 mg total) by mouth daily. Ov needed 90 tablet 0  . Blood Glucose Monitoring Suppl (ONE TOUCH ULTRA 2) w/Device KIT Use  to check sugars daily. 1 each 0  . cyclobenzaprine (FLEXERIL) 10 MG tablet TAKE 1 TABLET (10 MG TOTAL) BY MOUTH 3 (THREE) TIMES DAILY AS NEEDED FOR MUSCLE SPASMS. (Patient not taking: Reported on 01/15/2017) 30 tablet 0  . diclofenac (VOLTAREN) 75 MG EC tablet Take 75 mg by mouth 2 (two) times daily.    . fluticasone (FLONASE) 50 MCG/ACT nasal spray Place 2 sprays into both nostrils daily. 16 g 11  . glipiZIDE (GLUCOTROL XL) 5 MG 24 hr tablet Take 1 tablet (5 mg total) by mouth daily with breakfast. 90 tablet 3  . glucose blood (ONE TOUCH ULTRA TEST) test strip Use as instructed check one time a day. 100 each 3  . guaiFENesin (MUCINEX) 600 MG 12 hr tablet Take 1,200 mg by mouth 2 (two) times daily as needed for cough or to loosen phlegm.     Marland Kitchen ibuprofen (ADVIL,MOTRIN) 100 MG tablet Take 100 mg by mouth every 6 (six) hours as needed for pain.     . Lancets (ONETOUCH ULTRASOFT) lancets Use as instructed check sugar one time daily. 100 each 12  . metFORMIN (GLUCOPHAGE) 500 MG tablet Take 1 tablet (500 mg total) by mouth 2 (two) times daily with a meal. 180 tablet 3  . traMADol (ULTRAM) 50 MG tablet Take 1 tablet (50 mg total) by mouth every 8 (eight) hours as needed. (Patient not taking: Reported on 01/15/2017) 30 tablet 0   No current facility-administered medications on file prior to visit.    Allergies  Allergen Reactions  . Codeine Other (See Comments)    REACTION: nausea   Family History  Problem Relation Age of Onset  . Snoring Father   . Esophageal varices Father   . Diabetes Paternal Grandmother   . Heart disease Mother   . Gallstones Mother    PE: BP 134/78 (BP Location: Left Arm, Patient Position: Sitting)   Pulse 80   Ht 5' 7.5" (1.715 m)   Wt 254 lb (115.2 kg)   SpO2 96%   BMI 39.19 kg/m  Wt Readings from Last 3 Encounters:  05/19/17 254 lb (115.2 kg)  01/15/17 261 lb (118.4 kg)  12/11/16 250 lb 9.6 oz (113.7 kg)   Constitutional: overweight, in NAD Eyes: PERRLA, EOMI,  no exophthalmos ENT: moist mucous membranes, no thyromegaly, no cervical lymphadenopathy Cardiovascular: RRR, No MRG Respiratory: CTA B Gastrointestinal: abdomen soft, NT, ND, BS+ Musculoskeletal: no deformities, strength intact in all 4 Skin: moist, warm, no rashes Neurological: no tremor with outstretched hands, DTR normal in all 4   ASSESSMENT: 1. DM2, non-insulin-dependent, uncontrolled, without long term complications, but with Hgly   2. HL  PLAN:  1. Patient with well-controlled DM 2, with even better control at this visit, except for occasional  spikes. He had one sugar in the morning, at 179, due to dietary indiscretions the night before. Today, HbA1c is 5.5% (better!). - We can stop the morning dose of glipizide for now and only keep the dinnertime low-dose glipizide before a larger meal. He is now using this very infrequently - I suggested to:  Patient Instructions  Please continue: - Metformin 1000 mg at dinnertime - 1/2 of a Glipizide ER if you have a large dinner or you have dessert after dinner.  Please stop Glipizide ER in am  Please return in 6 months with your sugar log.   - continue checking sugars at different times of the day - check 1x a day, rotating checks - advised for yearly eye exams >> he is UTD - refuses flu shot here - will get it through [facility] - Return to clinic in 6 mo with sugar log   2. HL - continue Lipitor low dose >> no SEs - will check Lipid panel  Office Visit on 05/19/2017  Component Date Value Ref Range Status  . Glucose, Bld 05/19/2017 118* 65 - 99 mg/dL Final   Comment: .            Fasting reference interval . For someone without known diabetes, a glucose value between 100 and 125 mg/dL is consistent with prediabetes and should be confirmed with a follow-up test. .   . BUN 05/19/2017 14  7 - 25 mg/dL Final  . Creat 05/19/2017 1.13  0.70 - 1.25 mg/dL Final   Comment: For patients >55 years of age, the reference  limit for Creatinine is approximately 13% higher for people identified as African-American. .   . GFR, Est Non African American 05/19/2017 67  > OR = 60 mL/min/1.46m Final  . GFR, Est African American 05/19/2017 78  > OR = 60 mL/min/1.740mFinal  . BUN/Creatinine Ratio 1074/12/8786OT APPLICABLE  6 - 22 (calc) Final  . Sodium 05/19/2017 141  135 - 146 mmol/L Final  . Potassium 05/19/2017 4.7  3.5 - 5.3 mmol/L Final  . Chloride 05/19/2017 107  98 - 110 mmol/L Final  . CO2 05/19/2017 26  20 - 32 mmol/L Final  . Calcium 05/19/2017 9.5  8.6 - 10.3 mg/dL Final  . Total Protein 05/19/2017 6.6  6.1 - 8.1 g/dL Final  . Albumin 05/19/2017 4.0  3.6 - 5.1 g/dL Final  . Globulin 05/19/2017 2.6  1.9 - 3.7 g/dL (calc) Final  . AG Ratio 05/19/2017 1.5  1.0 - 2.5 (calc) Final  . Total Bilirubin 05/19/2017 0.3  0.2 - 1.2 mg/dL Final  . Alkaline phosphatase (APISO) 05/19/2017 86  40 - 115 U/L Final  . AST 05/19/2017 19  10 - 35 U/L Final  . ALT 05/19/2017 22  9 - 46 U/L Final  . Cholesterol 05/19/2017 92  0 - 200 mg/dL Final   ATP III Classification       Desirable:  < 200 mg/dL               Borderline High:  200 - 239 mg/dL          High:  > = 240 mg/dL  . Triglycerides 05/19/2017 179.0* 0.0 - 149.0 mg/dL Final   Normal:  <150 mg/dLBorderline High:  150 - 199 mg/dL  . HDL 05/19/2017 32.60* >39.00 mg/dL Final  . VLDL 05/19/2017 35.8  0.0 - 40.0 mg/dL Final  . LDL Cholesterol 05/19/2017 24  0 - 99 mg/dL Final  . Total CHOL/HDL  Ratio 05/19/2017 3   Final                  Men          Women1/2 Average Risk     3.4          3.3Average Risk          5.0          4.42X Average Risk          9.6          7.13X Average Risk          15.0          11.0                      . NonHDL 05/19/2017 59.57   Final   NOTE:  Non-HDL goal should be 30 mg/dL higher than patient's LDL goal (i.e. LDL goal of < 70 mg/dL, would have non-HDL goal of < 100 mg/dL)  . Microalb, Ur 05/19/2017 1.5  0.0 - 1.9 mg/dL Final  .  Creatinine,U 05/19/2017 272.5  mg/dL Final  . Microalb Creat Ratio 05/19/2017 0.6  0.0 - 30.0 mg/g Final  . Hemoglobin A1C 05/19/2017 5.5   Final   Msg sent: Dear Mr Rumbold, Labs are better, except slightly higher triglycerides. Continue to lower the concentrated sweets, saturated fat and animal products in your diet and increase fruit and veggies. Sincerely, Philemon Kingdom MD  Philemon Kingdom, MD PhD [facility]

## 2017-05-19 NOTE — Patient Instructions (Addendum)
Please continue: - Metformin 1000 mg at dinnertime - 1/2 of a Glipizide ER if you have a large dinner or you have dessert after dinner.  Please stop Glipizide ER in am  Please return in 6 months with your sugar log.

## 2017-05-20 ENCOUNTER — Encounter: Payer: Self-pay | Admitting: Pulmonary Disease

## 2017-05-21 ENCOUNTER — Encounter: Payer: Self-pay | Admitting: Pulmonary Disease

## 2017-05-21 ENCOUNTER — Ambulatory Visit (INDEPENDENT_AMBULATORY_CARE_PROVIDER_SITE_OTHER): Payer: BLUE CROSS/BLUE SHIELD | Admitting: Pulmonary Disease

## 2017-05-21 DIAGNOSIS — G4733 Obstructive sleep apnea (adult) (pediatric): Secondary | ICD-10-CM | POA: Diagnosis not present

## 2017-05-21 NOTE — Addendum Note (Signed)
Addended by: Valerie Salts on: 05/21/2017 04:19 PM   Modules accepted: Orders

## 2017-05-21 NOTE — Progress Notes (Signed)
   Subjective:    Patient ID: Rodney Turner, male    DOB: Dec 15, 1950, 66 y.o.   MRN: [id number]  HPI  66 year old obese man for follow-up of OSA. He is officially retired today. He was diagnosed with diabetes over the last year and has lost 26 pounds to his current weight of 250. CPAP is working well to one of problems with mask or pressure. He occasionally has a leak from his mouth and reports dry mouth in the morning. Wakes up feeling rested, no daytime somnolence or fatigue  Diabetes is now better controlled on metformin with HbA1c 5.9  Significant tests/ events reviewed  HST 2013:  AHI 51/hr.  Optimal pressure 12cm on autotitration.   Review of Systems Patient denies significant dyspnea,cough, hemoptysis,  chest pain, palpitations, pedal edema, orthopnea, paroxysmal nocturnal dyspnea, lightheadedness, nausea, vomiting, abdominal or  leg pains      Objective:   Physical Exam   Gen. Pleasant, obese, in no distress ENT - no lesions, no post nasal drip Neck: No JVD, no thyromegaly, no carotid bruits Lungs: no use of accessory muscles, no dullness to percussion, decreased without rales or rhonchi  Cardiovascular: Rhythm regular, heart sounds  normal, no murmurs or gallops, no peripheral edema Musculoskeletal: No deformities, no cyanosis or clubbing , no tremors        Assessment & Plan:

## 2017-05-21 NOTE — Assessment & Plan Note (Signed)
CPAP at 12 cm seems to be working well. CPAP supplies renewed for a year.  Weight loss encouraged, compliance with goal of at least 4-6 hrs every night is the expectation. Advised against medications with sedative side effects Cautioned against driving when sleepy - understanding that sleepiness will vary on a day to day basis

## 2017-05-21 NOTE — Assessment & Plan Note (Signed)
Weight loss encouraged with goal of about 225 pounds-

## 2017-05-21 NOTE — Patient Instructions (Signed)
Your CPAP is set at 12 cm and is working well. Congratulations on your weight loss and your retirement

## 2017-08-03 ENCOUNTER — Other Ambulatory Visit: Payer: Self-pay | Admitting: Family Medicine

## 2017-08-03 DIAGNOSIS — E119 Type 2 diabetes mellitus without complications: Secondary | ICD-10-CM

## 2017-09-12 ENCOUNTER — Encounter: Payer: Self-pay | Admitting: Emergency Medicine

## 2017-09-12 ENCOUNTER — Other Ambulatory Visit: Payer: Self-pay

## 2017-09-12 ENCOUNTER — Ambulatory Visit: Payer: Medicare Other | Admitting: Emergency Medicine

## 2017-09-12 VITALS — BP 106/68 | HR 98 | Temp 97.9°F | Resp 16 | Ht 66.5 in | Wt 262.4 lb

## 2017-09-12 DIAGNOSIS — S5011XA Contusion of right forearm, initial encounter: Secondary | ICD-10-CM

## 2017-09-12 DIAGNOSIS — H00036 Abscess of eyelid left eye, unspecified eyelid: Secondary | ICD-10-CM | POA: Diagnosis not present

## 2017-09-12 MED ORDER — MUPIROCIN 2 % EX OINT: TOPICAL_OINTMENT | CUTANEOUS | 1 refills | Status: DC

## 2017-09-12 MED ORDER — CEFADROXIL 500 MG PO CAPS: 500.0000 mg | ORAL_CAPSULE | Freq: Two times a day (BID) | ORAL | 0 refills | Status: AC

## 2017-09-12 NOTE — Patient Instructions (Addendum)
IF you received an x-ray today, you will receive an invoice from [facility]. Please contact [facility] at [phone] with questions or concerns regarding your invoice.   IF you received labwork today, you will receive an invoice from [facility]. Please contact [facility] at [phone] with questions or concerns regarding your invoice.   Our billing staff will not be able to assist you with questions regarding bills from these companies.  You will be contacted with the lab results as soon as they are available. The fastest way to get your results is to activate your My Chart account. Instructions are located on the last page of this paperwork. If you have not heard from [place] regarding the results in 2 weeks, please contact this office.     Hematoma A hematoma is a collection of blood. The collection of blood can turn into a hard, painful lump under the skin. Your skin may turn blue or yellow if the hematoma is close to the surface of the skin. Most hematomas get better in a few days to weeks. Some hematomas are serious and need medical care. Hematomas can be very small or very big. Follow these instructions at home:  Apply ice to the injured area: ? Put ice in a plastic bag. ? Place a towel between your skin and the bag. ? Leave the ice on for 20 minutes, 2-3 times a day for the first 1 to 2 days.  After the first 2 days, switch to using warm packs on the injured area.  Raise (elevate) the injured area to lessen pain and puffiness (swelling). You may also wrap the area with an elastic bandage. Make sure the bandage is not wrapped too tight.  If you have a painful hematoma on your leg or foot, you may use crutches for a couple days.  Only take medicines as told by your doctor. Get help right away if:  Your pain gets worse.  Your pain is not controlled with medicine.  You have a fever.  Your puffiness gets worse.  Your skin turns more blue or yellow.  Your  skin over the hematoma breaks or starts bleeding.  Your hematoma is in your chest or belly (abdomen) and you are short of breath, feel weak, or have a change in consciousness.  Your hematoma is on your scalp and you have a headache that gets worse or a change in alertness or consciousness. This information is not intended to replace advice given to you by your health care provider. Make sure you discuss any questions you have with your health care provider. Document Released: 09/12/2004 Document Revised: 01/11/2016 Document Reviewed: 01/13/2013 Elsevier Interactive Patient Education  2017 [employer]  Cellulitis, Adult Cellulitis is a skin infection. The infected area is usually red and sore. This condition occurs most often in the arms and lower legs. It is very important to get treated for this condition. Follow these instructions at home:  Take over-the-counter and prescription medicines only as told by your doctor.  If you were prescribed an antibiotic medicine, take it as told by your doctor. Do not stop taking the antibiotic even if you start to feel better.  Drink enough fluid to keep your pee (urine) clear or pale yellow.  Do not touch or rub the infected area.  Raise (elevate) the infected area above the level of your heart while you are sitting or lying down.  Place warm or cold wet cloths (warm or cold compresses) on the infected  area. Do this as told by your doctor.  Keep all follow-up visits as told by your doctor. This is important. These visits let your doctor make sure your infection is not getting worse. Contact a doctor if:  You have a fever.  Your symptoms do not get better after 1-2 days of treatment.  Your bone or joint under the infected area starts to hurt after the skin has healed.  Your infection comes back. This can happen in the same area or another area.  You have a swollen bump in the infected area.  You have new symptoms.  You feel ill and also  have muscle aches and pains. Get help right away if:  Your symptoms get worse.  You feel very sleepy.  You throw up (vomit) or have watery poop (diarrhea) for a long time.  There are red streaks coming from the infected area.  Your red area gets larger.  Your red area turns darker. This information is not intended to replace advice given to you by your health care provider. Make sure you discuss any questions you have with your health care provider. Document Released: 01/22/2008 Document Revised: 01/11/2016 Document Reviewed: 06/14/2015 Elsevier Interactive Patient Education  2018 [employer].

## 2017-09-12 NOTE — Progress Notes (Signed)
Rodney Turner 67 y.o.   Chief Complaint  Patient presents with  . Rash    LEFT EYE x 5 days  . Fall    RIGHT ELBOW - per patient a little hard knot 07/29/2017    HISTORY OF PRESENT ILLNESS: This is a 67 y.o. male complaining of redness and swelling to left eyelid for 5 days not responding to topical Neosporin. Diabetic. Also fell last December and injured right forearm; better but still has lump on it (no pain).  HPI   Prior to Admission medications   Medication Sig Start Date End Date Taking? Authorizing Provider  aspirin 81 MG tablet Take 81 mg by mouth daily.   Yes [provider]  atorvastatin (LIPITOR) 10 MG tablet Take 1 tablet (10 mg total) by mouth daily at 6 PM. Office visit needed 08/03/17  Yes Wardell Honour, MD  fluticasone [employer]) 50 MCG/ACT nasal spray Place 2 sprays into both nostrils daily. 07/07/13  Yes Le, Thao P, DO  guaiFENesin (MUCINEX) 600 MG 12 hr tablet Take 1,200 mg by mouth 2 (two) times daily as needed for cough or to loosen phlegm.    Yes [provider]  ibuprofen (ADVIL,MOTRIN) 100 MG tablet Take 100 mg by mouth every 6 (six) hours as needed for pain.    Yes [provider]  metFORMIN (GLUCOPHAGE) 500 MG tablet Take 1 tablet (500 mg total) by mouth 2 (two) times daily with a meal. 04/03/17  Yes Philemon Kingdom, MD  Blood Glucose Monitoring Suppl (ONE TOUCH ULTRA 2) w/Device KIT Use to check sugars daily. 06/03/16   Philemon Kingdom, MD  diclofenac (VOLTAREN) 75 MG EC tablet Take 75 mg by mouth 2 (two) times daily.    [provider]  glucose blood (ONE TOUCH ULTRA TEST) test strip Use as instructed check one time a day. 04/03/17   Philemon Kingdom, MD  Lancets [facility] ULTRASOFT) lancets Use as instructed check sugar one time daily. 04/03/17   Philemon Kingdom, MD    Allergies  Allergen Reactions  . Codeine Other (See Comments)    REACTION: nausea    Patient Active Problem List   Diagnosis Date Noted  .  Acute left-sided low back pain with left-sided sciatica 11/25/2016  . Contusion of right shoulder 11/25/2016  . Acute lumbar myofascial strain 11/06/2016  . Acute left-sided low back pain without sciatica 11/06/2016  . Hyperlipidemia 06/06/2014  . LBBB (left bundle branch block) 06/06/2014  . Diabetes mellitus ([place]) 05/24/2014  . Morbid obesity ([place]) 03/17/2014  . Obstructive sleep apnea 02/22/2008    Past Medical History:  Diagnosis Date  . Allergic rhinitis   . Diabetes mellitus ([place]) 05/24/2014  . History of kidney stones 1990's  . Morbid obesity ([place]) 03/17/2014  . OBSTRUCTIVE SLEEP APNEA 02/22/2008   HST 2013:  AHI 51/hr.  Optimal pressure 12cm on autotitration.    . OSA on CPAP     Past Surgical History:  Procedure Laterality Date  . KNEE SURGERY     left    Social History   Socioeconomic History  . Marital status: Married    Spouse name: Not on file  . Number of children: Not on file  . Years of education: Not on file  . Highest education level: Not on file  Social Needs  . Financial resource strain: Not on file  . Food insecurity - worry: Not on file  . Food insecurity - inability: Not on file  . Transportation needs - medical: Not  on file  . Transportation needs - non-medical: Not on file  Occupational History  . Occupation: Press photographer  Tobacco Use  . Smoking status: Never Smoker  . Smokeless tobacco: Never Used  Substance and Sexual Activity  . Alcohol use: No  . Drug use: No  . Sexual activity: Yes    Birth control/protection: None  Other Topics Concern  . Not on file  Social History Narrative  . Not on file    Family History  Problem Relation Age of Onset  . Snoring Father   . Esophageal varices Father   . Diabetes Paternal Grandmother   . Heart disease Mother   . Gallstones Mother      Review of Systems  Constitutional: Negative.  Negative for chills.  HENT: Negative.   Eyes: Negative for blurred vision, double vision, discharge and redness.    Respiratory: Negative for cough and shortness of breath.   Cardiovascular: Negative for chest pain and palpitations.  Gastrointestinal: Negative for abdominal pain, nausea and vomiting.  Musculoskeletal: Positive for back pain (chronic).  Skin: Positive for itching and rash.  Neurological: Negative.  Negative for dizziness and headaches.  Endo/Heme/Allergies: Negative.   All other systems reviewed and are negative.  Vitals:   09/12/17 1445  BP: 106/68  Pulse: 98  Resp: 16  Temp: 97.9 F (36.6 C)  SpO2: 95%     Physical Exam  Constitutional: He is oriented to person, place, and time. He appears well-developed.  Obese  HENT:  Head: Normocephalic and atraumatic.  Nose: Nose normal.  Mouth/Throat: Oropharynx is clear and moist.  Eyes: Conjunctivae and EOM are normal. Pupils are equal, round, and reactive to light.  Neck: Normal range of motion. Neck supple.  Cardiovascular: Normal rate, regular rhythm and normal heart sounds.  Pulmonary/Chest: Effort normal and breath sounds normal.  Musculoskeletal: Normal range of motion.  Right forearm: +palpable non-tender hematoma to proximal forearm; FROM  Neurological: He is alert and oriented to person, place, and time. No sensory deficit. He exhibits normal muscle tone.  Skin: Capillary refill takes less than 2 seconds.  Left upper eyelid: +erythema with mild swelling   Psychiatric: He has a normal mood and affect. His behavior is normal.  Vitals reviewed.  A total of 25 minutes was spent in the room with the patient, greater than 50% of which was in counseling/coordination of care.   ASSESSMENT & PLAN: Rodney Turner was seen today for rash and fall.  Diagnoses and all orders for this visit:  Eyelid cellulitis, left  Traumatic hematoma of right forearm, initial encounter  Other orders -     cefadroxil (DURICEF) 500 MG capsule; Take 1 capsule (500 mg total) by mouth 2 (two) times daily for 7 days. -     mupirocin ointment  (BACTROBAN) 2 %; Sig to affected area twice a day x 7 days.    Patient Instructions       IF you received an x-ray today, you will receive an invoice from [facility]. Please contact [facility] at [phone] with questions or concerns regarding your invoice.   IF you received labwork today, you will receive an invoice from [facility]. Please contact [facility] at [phone] with questions or concerns regarding your invoice.   Our billing staff will not be able to assist you with questions regarding bills from these companies.  You will be contacted with the lab results as soon as they are available. The fastest way to get your results is to activate your My Chart  account. Instructions are located on the last page of this paperwork. If you have not heard from [place] regarding the results in 2 weeks, please contact this office.     Hematoma A hematoma is a collection of blood. The collection of blood can turn into a hard, painful lump under the skin. Your skin may turn blue or yellow if the hematoma is close to the surface of the skin. Most hematomas get better in a few days to weeks. Some hematomas are serious and need medical care. Hematomas can be very small or very big. Follow these instructions at home:  Apply ice to the injured area: ? Put ice in a plastic bag. ? Place a towel between your skin and the bag. ? Leave the ice on for 20 minutes, 2-3 times a day for the first 1 to 2 days.  After the first 2 days, switch to using warm packs on the injured area.  Raise (elevate) the injured area to lessen pain and puffiness (swelling). You may also wrap the area with an elastic bandage. Make sure the bandage is not wrapped too tight.  If you have a painful hematoma on your leg or foot, you may use crutches for a couple days.  Only take medicines as told by your doctor. Get help right away if:  Your pain gets worse.  Your pain is not controlled with medicine.  You  have a fever.  Your puffiness gets worse.  Your skin turns more blue or yellow.  Your skin over the hematoma breaks or starts bleeding.  Your hematoma is in your chest or belly (abdomen) and you are short of breath, feel weak, or have a change in consciousness.  Your hematoma is on your scalp and you have a headache that gets worse or a change in alertness or consciousness. This information is not intended to replace advice given to you by your health care provider. Make sure you discuss any questions you have with your health care provider. Document Released: 09/12/2004 Document Revised: 01/11/2016 Document Reviewed: 01/13/2013 Elsevier Interactive Patient Education  2017 Elsevier Inc.  Cellulitis, Adult Cellulitis is a skin infection. The infected area is usually red and sore. This condition occurs most often in the arms and lower legs. It is very important to get treated for this condition. Follow these instructions at home:  Take over-the-counter and prescription medicines only as told by your doctor.  If you were prescribed an antibiotic medicine, take it as told by your doctor. Do not stop taking the antibiotic even if you start to feel better.  Drink enough fluid to keep your pee (urine) clear or pale yellow.  Do not touch or rub the infected area.  Raise (elevate) the infected area above the level of your heart while you are sitting or lying down.  Place warm or cold wet cloths (warm or cold compresses) on the infected area. Do this as told by your doctor.  Keep all follow-up visits as told by your doctor. This is important. These visits let your doctor make sure your infection is not getting worse. Contact a doctor if:  You have a fever.  Your symptoms do not get better after 1-2 days of treatment.  Your bone or joint under the infected area starts to hurt after the skin has healed.  Your infection comes back. This can happen in the same area or another area.  You  have a swollen bump in the infected area.  You have new symptoms.  You feel ill and also have muscle aches and pains. Get help right away if:  Your symptoms get worse.  You feel very sleepy.  You throw up (vomit) or have watery poop (diarrhea) for a long time.  There are red streaks coming from the infected area.  Your red area gets larger.  Your red area turns darker. This information is not intended to replace advice given to you by your health care provider. Make sure you discuss any questions you have with your health care provider. Document Released: 01/22/2008 Document Revised: 01/11/2016 Document Reviewed: 06/14/2015 Elsevier Interactive Patient Education  2018 Elsevier Inc.      Agustina Caroli, MD [facility]

## 2017-09-17 ENCOUNTER — Encounter: Payer: Self-pay | Admitting: Emergency Medicine

## 2017-09-18 NOTE — Telephone Encounter (Signed)
Advise patient to take probiotics. Should finish the antibiotics.  Probiotics should minimize the side effects.  Thanks.

## 2017-10-22 ENCOUNTER — Other Ambulatory Visit: Payer: Self-pay

## 2017-10-22 MED ORDER — GLUCOSE BLOOD VI STRP: ORAL_STRIP | 12 refills | Status: DC

## 2017-10-27 ENCOUNTER — Other Ambulatory Visit: Payer: Self-pay

## 2017-11-01 ENCOUNTER — Other Ambulatory Visit: Payer: Self-pay | Admitting: Family Medicine

## 2017-11-01 DIAGNOSIS — E119 Type 2 diabetes mellitus without complications: Secondary | ICD-10-CM

## 2017-11-17 ENCOUNTER — Encounter: Payer: Self-pay | Admitting: Internal Medicine

## 2017-11-17 ENCOUNTER — Ambulatory Visit: Payer: BLUE CROSS/BLUE SHIELD | Admitting: Internal Medicine

## 2017-11-17 VITALS — BP 112/74 | HR 77 | Ht 66.5 in | Wt 264.2 lb

## 2017-11-17 DIAGNOSIS — E1165 Type 2 diabetes mellitus with hyperglycemia: Secondary | ICD-10-CM

## 2017-11-17 DIAGNOSIS — E785 Hyperlipidemia, unspecified: Secondary | ICD-10-CM | POA: Diagnosis not present

## 2017-11-17 DIAGNOSIS — E119 Type 2 diabetes mellitus without complications: Secondary | ICD-10-CM

## 2017-11-17 LAB — POCT GLYCOSYLATED HEMOGLOBIN (HGB A1C): Hemoglobin A1C: 5.9

## 2017-11-17 MED ORDER — ATORVASTATIN CALCIUM 10 MG PO TABS: 10.0000 mg | ORAL_TABLET | Freq: Every day | ORAL | 3 refills | Status: DC

## 2017-11-17 NOTE — Patient Instructions (Addendum)
Patient Instructions  Please continue: - Metformin 1000 mg at dinnertime - Glipizide ER 5 mg before b'fast and 1/2 tablet at night before a large dinner or dessert  Please return in 6 months with your sugar log.

## 2017-11-17 NOTE — Progress Notes (Signed)
Patient ID: Rodney Mcalpine., male   DOB: Jan 07, 1951, 67 y.o.   MRN: [id number]   HPI: Rodney Overbeck. is a 67 y.o.-year-old male, initially referred by his PCP, Dr. Everlene Farrier, returning for f/u for DM2, dx in 03/2016, non-insulin-dependent, uncontrolled, without long term complications. His wife, Rodney Turner, is also my pt. last visit 6 months ago.  He retired 05/2017.  Reviewed history: In 01/2016, he started to feel very fatigued, frequent urination, nocturia, weight loss - saw PCP >> HbA1c very high (>14%). Prev. HbA1c levels were in the 6-7% range.  Last hemoglobin A1c: Lab Results  Component Value Date   HGBA1C 5.5 05/19/2017   HGBA1C 5.9 01/15/2017   HGBA1C 5.6 10/15/2016   Pt is on a regimen of: - Metformin 1000 mg at dinnertime -  + 1/2 of a Glipizide ER if you have a large dinner or you have dessert after dinner.  Pt checks his sugars 1-2 times a day: - am:  108-169 >> 124, 179 >> 134-160, 164 (snacks at night) - 2h after b'fast: n/c - before lunch:  126, 135 >> 126 >> n/c >> 100-131 - 2h after lunch: 230 (double cheeseburger and fries) >> n/c  - before dinner: 75, 101-124, 145>> 82-107 >> 80-114, 130, 149 - 2h after dinner: n/c >> 120-159 >> n/c - bedtime: n/c >> 149 - nighttime: n/c Lowest sugar was 75 >> 80; he has hypoglycemia awareness in the 70s.  Highest sugar was 169 >> 164.   Pt's meals are: - Breakfast: McD[employer] >> burrito - Lunch: grilled chicken sandwich - Dinner: [employer]fil[employer]a or [employer]'s salad or meat + veggies + starch - Snacks: 1: chips, popcorn, fruit cups, fresh fruit He restarted going to the gym.  -No CKD. Last BUN/creatinine:  Lab Results  Component Value Date   BUN 14 05/19/2017   BUN 13 04/16/2016   CREATININE 1.13 05/19/2017   CREATININE 1.05 04/16/2016   -+ HL; last set of lipids: Lab Results  Component Value Date   CHOL 92 05/19/2017   HDL 32.60 (L) 05/19/2017   LDLCALC 24 05/19/2017   TRIG 179.0 (H) 05/19/2017   CHOLHDL 3  05/19/2017  On Lipitor 10. - last eye exam was in 2018: No DR. + astigmatism.  Dr. Sabra Heck. - Denies numbness and tingling in his feet. On ASA 81.  He has OSA.  ROS: Constitutional: + weight gain/no weight loss, no fatigue, no subjective hyperthermia, no subjective hypothermia Eyes: no blurry vision, no xerophthalmia ENT: no sore throat, no nodules palpated in throat, no dysphagia, no odynophagia, no hoarseness Cardiovascular: no CP/no SOB/no palpitations/no leg swelling Respiratory: no cough/no SOB/no wheezing Gastrointestinal: no N/no V/no D/no C/no acid reflux Musculoskeletal: no muscle aches/no joint aches Skin: no rashes, no hair loss Neurological: no tremors/no numbness/no tingling/no dizziness  I reviewed pt's medications, allergies, PMH, social hx, family hx, and changes were documented in the history of present illness. Otherwise, unchanged from my initial visit note.   Past Medical History:  Diagnosis Date  . Allergic rhinitis   . Diabetes mellitus ([place]) 05/24/2014  . History of kidney stones 1990's  . Morbid obesity ([place]) 03/17/2014  . OBSTRUCTIVE SLEEP APNEA 02/22/2008   HST 2013:  AHI 51/hr.  Optimal pressure 12cm on autotitration.    . OSA on CPAP    Past Surgical History:  Procedure Laterality Date  . KNEE SURGERY     left   Social History   Social History  . Marital status: Married  Spouse name: N/A  . Number of children: 2   Occupational History  . sales    Social History Main Topics  . Smoking status: Never Smoker  . Smokeless tobacco: Never Used  . Alcohol use No  . Drug use: No   Current Outpatient Medications on File Prior to Visit  Medication Sig Dispense Refill  . aspirin 81 MG tablet Take 81 mg by mouth daily.    Rodney Kitchen atorvastatin (LIPITOR) 10 MG tablet Take 1 tablet (10 mg total) by mouth daily at 6 PM. Office visit needed 90 tablet 0  . Blood Glucose Monitoring Suppl (ONE TOUCH ULTRA 2) w/Device KIT Use to check sugars daily. 1 each 0  .  diclofenac (VOLTAREN) 75 MG EC tablet Take 75 mg by mouth 2 (two) times daily.    . fluticasone (FLONASE) 50 MCG/ACT nasal spray Place 2 sprays into both nostrils daily. 16 g 11  . glucose blood (ONE TOUCH ULTRA TEST) test strip Use as instructed check one time a day. 100 each 3  . glucose blood test strip Use as instructed 100 each 12  . guaiFENesin (MUCINEX) 600 MG 12 hr tablet Take 1,200 mg by mouth 2 (two) times daily as needed for cough or to loosen phlegm.     Rodney Kitchen ibuprofen (ADVIL,MOTRIN) 100 MG tablet Take 100 mg by mouth every 6 (six) hours as needed for pain.     . Lancets (ONETOUCH ULTRASOFT) lancets Use as instructed check sugar one time daily. 100 each 12  . metFORMIN (GLUCOPHAGE) 500 MG tablet Take 1 tablet (500 mg total) by mouth 2 (two) times daily with a meal. 180 tablet 3  . mupirocin ointment (BACTROBAN) 2 % Sig to affected area twice a day x 7 days. 22 g 1   No current facility-administered medications on file prior to visit.    Allergies  Allergen Reactions  . Codeine Other (See Comments)    REACTION: nausea   Family History  Problem Relation Age of Onset  . Snoring Father   . Esophageal varices Father   . Diabetes Paternal Grandmother   . Heart disease Mother   . Gallstones Mother    PE: BP 112/74   Pulse 77   Ht 5' 6.5" (1.689 m)   Wt 264 lb 3.2 oz (119.8 kg)   SpO2 95%   BMI 42.00 kg/m  Wt Readings from Last 3 Encounters:  11/17/17 264 lb 3.2 oz (119.8 kg)  09/12/17 262 lb 6.4 oz (119 kg)  05/21/17 250 lb (113.4 kg)   Constitutional: overweight, in NAD Eyes: PERRLA, EOMI, no exophthalmos ENT: moist mucous membranes, no thyromegaly, no cervical lymphadenopathy Cardiovascular: RRR, No MRG Respiratory: CTA B Gastrointestinal: abdomen soft, NT, ND, BS+ Musculoskeletal: no deformities, strength intact in all 4 Skin: moist, warm, no rashes Neurological: no tremor with outstretched hands, DTR normal in all 4  ASSESSMENT: 1. DM2, non-insulin-dependent,  uncontrolled, without long term complications, but with hyperglycemia  2. HL  3. Obesity class 3 BMI Classification:  < 18.5 underweight   18.5-24.9 normal weight   25.0-29.9 overweight   30.0-34.9 class I obesity   35.0-39.9 class II obesity   ? 40.0 class III obesity   PLAN:  1. Patient with well-controlled type 2 diabetes, with improved control at last visit, except for occasional hyperglycemic spikes.  These were usually due to dietary indiscretions.  HbA1c at last visit was great, at 5.5%.  We stopped his morning dose of glipizide (he ended up restarting this as  sugars increased after he retired) and only kept the dinnertime low-dose glipizide before a large meal. - sugars are great later in the day but higher in am >> discussed about decreasing snacks at night, but also advised him to use Glipizide at night more - I suggested to:  Patient Instructions  Please continue: - Metformin 1000 mg at dinnertime - Glipizide ER 5 mg before b'fast and 1/2 tablet at night before a large dinner or dessert  Please return in 6 months with your sugar log.  - today, HbA1c is 5.9% (slightly higher) - continue checking sugars at different times of the day - check 1x a day, rotating checks - advised for yearly eye exams >> he is UTD - Return to clinic in 6 mo with sugar log    2. HL - Reviewed recent lipid panel from last visit: LDL was great, at 24; triglycerides high; HDL low - Continue Lipitor -no side effects from it  3.  Obesity class 3 - gained 14 lbs since last visit >> discussed about cutting out fatty foods and snacks at night  Philemon Kingdom, MD PhD [facility]

## 2018-01-06 ENCOUNTER — Encounter: Payer: Self-pay | Admitting: Gastroenterology

## 2018-01-13 ENCOUNTER — Encounter: Payer: Self-pay | Admitting: Family Medicine

## 2018-01-14 DIAGNOSIS — E119 Type 2 diabetes mellitus without complications: Secondary | ICD-10-CM | POA: Diagnosis not present

## 2018-01-15 ENCOUNTER — Encounter: Payer: Self-pay | Admitting: Emergency Medicine

## 2018-01-15 LAB — HM DIABETES EYE EXAM

## 2018-03-21 ENCOUNTER — Other Ambulatory Visit: Payer: Self-pay | Admitting: Internal Medicine

## 2018-05-19 ENCOUNTER — Encounter: Payer: Self-pay | Admitting: Internal Medicine

## 2018-05-19 ENCOUNTER — Ambulatory Visit (INDEPENDENT_AMBULATORY_CARE_PROVIDER_SITE_OTHER): Payer: Medicare Other | Admitting: Internal Medicine

## 2018-05-19 VITALS — BP 118/72 | HR 72 | Ht 66.5 in | Wt 267.0 lb

## 2018-05-19 DIAGNOSIS — Z23 Encounter for immunization: Secondary | ICD-10-CM

## 2018-05-19 DIAGNOSIS — E1165 Type 2 diabetes mellitus with hyperglycemia: Secondary | ICD-10-CM

## 2018-05-19 DIAGNOSIS — E785 Hyperlipidemia, unspecified: Secondary | ICD-10-CM | POA: Diagnosis not present

## 2018-05-19 DIAGNOSIS — E1122 Type 2 diabetes mellitus with diabetic chronic kidney disease: Secondary | ICD-10-CM | POA: Insufficient documentation

## 2018-05-19 LAB — POCT GLYCOSYLATED HEMOGLOBIN (HGB A1C): HEMOGLOBIN A1C: 5.9 % — AB (ref 4.0–5.6)

## 2018-05-19 NOTE — Progress Notes (Signed)
Patient ID: Rodney Mcalpine., male   DOB: May 17, 1951, 67 y.o.   MRN: [id number]   HPI: Rodney Kollmann. is a 67 y.o.-year-old male, initially referred by his PCP, Dr. Everlene Farrier, returning for f/u for DM2, dx in 03/2016, non-insulin-dependent, uncontrolled, without long term complications. His wife, Rodney Turner, is also my pt. Last visit was 6 months ago.  Reviewed history: In 01/2016, he started to feel very fatigued, frequent urination, nocturia, weight loss - saw PCP >> HbA1c very high (>14%). Prev. HbA1c levels were in the 6-7% range.  Last hemoglobin A1c: Lab Results  Component Value Date   HGBA1C 5.9 11/17/2017   HGBA1C 5.5 05/19/2017   HGBA1C 5.9 01/15/2017   Pt is on a regimen of: - Metformin 1000 mg at dinnertime - Glipizide ER 5 mg before b'fast +  2.5 mg before a large dinner or dessert  Pt checks his sugars 1-2 times a day: - am: 134-160, 164 (snacks at night) >> 120-152, 159 - 2h after b'fast: n/c - before lunch: 126 >> n/c >> 100-131 >> 115-137, 171 - 2h after lunch: 230 (double cheeseburger and fries) >> n/c  - before dinner: 82-107 >> 80-114, 130, 149 >> 88-121 - 2h after dinner: n/c >> 120-159 >> n/c - bedtime: n/c >> 149 - nighttime: n/c Lowest sugar was 75 >> 80 >> 88; he has hypoglycemia awareness in the 70s. Highest sugar was 169 >> 164 >> 171.   Pt's meals are: - Breakfast: [employer] >> burrito - Lunch: grilled chicken sandwich - Dinner: [employer] or [employer] salad or meat + veggies + starch - Snacks: 1: chips, popcorn, fruit cups, fresh fruit  - No CKD. Last BUN/creatinine:  Lab Results  Component Value Date   BUN 14 05/19/2017   BUN 13 04/16/2016   CREATININE 1.13 05/19/2017   CREATININE 1.05 04/16/2016   - + HL; last set of lipids: Lab Results  Component Value Date   CHOL 92 05/19/2017   HDL 32.60 (L) 05/19/2017   LDLCALC 24 05/19/2017   TRIG 179.0 (H) 05/19/2017   CHOLHDL 3 05/19/2017  On Lipitor 10. - last eye exam was in 01/2018:  No DR, + eye astigmatism.  Dr. Sabra Heck. -Now numbness and tingling in his feet. On ASA 81.  He has OSA.  He retired 05/2017.  ROS: Constitutional: no weight gain/no weight loss, no fatigue, no subjective hyperthermia, no subjective hypothermia Eyes: no blurry vision, no xerophthalmia ENT: no sore throat, no nodules palpated in throat, no dysphagia, no odynophagia, no hoarseness Cardiovascular: no CP/no SOB/no palpitations/no leg swelling Respiratory: no cough/no SOB/no wheezing Gastrointestinal: no N/no V/no D/no C/no acid reflux Musculoskeletal: no muscle aches/no joint aches Skin: no rashes, no hair loss Neurological: no tremors/no numbness/no tingling/no dizziness  I reviewed pt's medications, allergies, PMH, social hx, family hx, and changes were documented in the history of present illness. Otherwise, unchanged from my initial visit note.   Past Medical History:  Diagnosis Date  . Allergic rhinitis   . Diabetes mellitus ([place]) 05/24/2014  . History of kidney stones 1990's  . Morbid obesity ([place]) 03/17/2014  . OBSTRUCTIVE SLEEP APNEA 02/22/2008   HST 2013:  AHI 51/hr.  Optimal pressure 12cm on autotitration.    . OSA on CPAP    Past Surgical History:  Procedure Laterality Date  . KNEE SURGERY     left   Social History   Social History  . Marital status: Married    Spouse name: N/A  . Number  of children: 2   Occupational History  . sales    Social History Main Topics  . Smoking status: Never Smoker  . Smokeless tobacco: Never Used  . Alcohol use No  . Drug use: No   Current Outpatient Medications on File Prior to Visit  Medication Sig Dispense Refill  . aspirin 81 MG tablet Take 81 mg by mouth daily.    Rodney Kitchen atorvastatin (LIPITOR) 10 MG tablet Take 1 tablet (10 mg total) by mouth daily at 6 PM. 90 tablet 3  . Blood Glucose Monitoring Suppl (ONE TOUCH ULTRA 2) w/Device KIT Use to check sugars daily. 1 each 0  . fluticasone (FLONASE) 50 MCG/ACT nasal spray Place 2  sprays into both nostrils daily. 16 g 11  . glipiZIDE (GLUCOTROL XL) 5 MG 24 hr tablet Take 1 tablet by mouth daily.  3  . glipiZIDE (GLUCOTROL XL) 5 MG 24 hr tablet TAKE 1 TABLET BY MOUTH EVERY DAY WITH BREAKFAST 90 tablet 3  . glucose blood (ONE TOUCH ULTRA TEST) test strip Use as instructed check one time a day. 100 each 3  . glucose blood test strip Use as instructed 100 each 12  . guaiFENesin (MUCINEX) 600 MG 12 hr tablet Take 1,200 mg by mouth 2 (two) times daily as needed for cough or to loosen phlegm.     Rodney Kitchen ibuprofen (ADVIL,MOTRIN) 100 MG tablet Take 100 mg by mouth every 6 (six) hours as needed for pain.     . Lancets (ONETOUCH ULTRASOFT) lancets Use as instructed check sugar one time daily. 100 each 12  . metFORMIN (GLUCOPHAGE) 500 MG tablet TAKE 1 TABLET BY MOUTH TWICE A DAY WITH A MEAL 180 tablet 3  . mupirocin ointment (BACTROBAN) 2 % Sig to affected area twice a day x 7 days. 22 g 1   No current facility-administered medications on file prior to visit.    Allergies  Allergen Reactions  . Codeine Other (See Comments)    REACTION: nausea   Family History  Problem Relation Age of Onset  . Snoring Father   . Esophageal varices Father   . Diabetes Paternal Grandmother   . Heart disease Mother   . Gallstones Mother    PE: BP 118/72   Pulse 72   Ht 5' 6.5" (1.689 m)   Wt 267 lb (121.1 kg)   SpO2 96%   BMI 42.45 kg/m  Wt Readings from Last 3 Encounters:  05/19/18 267 lb (121.1 kg)  11/17/17 264 lb 3.2 oz (119.8 kg)  09/12/17 262 lb 6.4 oz (119 kg)   Constitutional: overweight, in NAD Eyes: PERRLA, EOMI, no exophthalmos ENT: moist mucous membranes, no thyromegaly, no cervical lymphadenopathy Cardiovascular: RRR, No MRG Respiratory: CTA B Gastrointestinal: abdomen soft, NT, ND, BS+ Musculoskeletal: no deformities, strength intact in all 4 Skin: moist, warm, no rashes Neurological: no tremor with outstretched hands, DTR normal in all 4  ASSESSMENT: 1. DM2,  non-insulin-dependent, uncontrolled, without long term complications, but with hyperglycemia  2. HL  3. Obesity class 3 BMI Classification:  < 18.5 underweight   18.5-24.9 normal weight   25.0-29.9 overweight   30.0-34.9 class I obesity   35.0-39.9 class II obesity   ? 40.0 class III obesity   PLAN:  1. Patient with well-controlled type 2 diabetes, on metformin and glipizide, with slightly higher HbA1c at last visit due to occasional hyperglycemic spikes due to dietary indiscretions.  At that time, his HbA1c was 5.9%, increased from 5.5%.  Sugars were greatly during  the day but high in the morning so we discussed about decreasing his snacks at night but I also advised him to use glipizide before dinner if he had a larger dinner or dessert. - at this visit, sugars are still high in am but at goal later in the day >> we discussed about moving Glipizide to before dinner as he is now taking it after a larger dinner - also, we again discussed about improving diet, discussed about stopping snacks after dinner, discussed about intermittent fasting and also switching to a lower fat diet - I suggested to:  Patient Instructions  Please continue: - Metformin 1000 mg at dinnertime - Glipizide ER 5 mg before b'fast and 2.5 mg before a large dinner or dessert  Please return in 6 months with your sugar log.  - today, HbA1c is 5.9% (stable, excellent) - continue checking sugars at different times of the day - check 1x a day, rotating checks - advised for yearly eye exams >> he is UTD - will check annual labs today - + flu shot today - Return to clinic in 6 mo with sugar log     2. HL - Reviewed latest lipid panel from 05/2017 Lab Results  Component Value Date   CHOL 92 05/19/2017   HDL 32.60 (L) 05/19/2017   LDLCALC 24 05/19/2017   TRIG 179.0 (H) 05/19/2017   CHOLHDL 3 05/19/2017  - Continues Lipitor without side effects. - He is due for another lipid panel >> recheck today  3.   Obesity class 3 -At last visit, he gained 14 pounds since the previous visit so we discussed about cutting out fatty foods and snacks at night - at this visit, weight is a little higher >> again discussed about cutting out snacks, moving sweets earlier in the day and cutting down fatty foods  Philemon Kingdom, MD PhD [facility]

## 2018-05-19 NOTE — Addendum Note (Signed)
Addended by: Cardell Peach I on: 05/19/2018 08:43 AM   Modules accepted: Orders

## 2018-05-19 NOTE — Patient Instructions (Addendum)
Please continue: - Metformin 1000 mg at dinnertime - Glipizide ER 5 mg before b'fast and 2.5 mg before a large dinner or dessert  Please return in 6 months with your sugar log.

## 2018-09-19 ENCOUNTER — Other Ambulatory Visit: Payer: Self-pay

## 2018-09-19 ENCOUNTER — Encounter: Payer: Self-pay | Admitting: Emergency Medicine

## 2018-09-19 ENCOUNTER — Ambulatory Visit (INDEPENDENT_AMBULATORY_CARE_PROVIDER_SITE_OTHER): Payer: Medicare Other | Admitting: Emergency Medicine

## 2018-09-19 VITALS — BP 133/77 | HR 70 | Temp 98.4°F | Resp 16 | Ht 66.5 in | Wt 265.4 lb

## 2018-09-19 DIAGNOSIS — R21 Rash and other nonspecific skin eruption: Secondary | ICD-10-CM

## 2018-09-19 DIAGNOSIS — Z1211 Encounter for screening for malignant neoplasm of colon: Secondary | ICD-10-CM | POA: Diagnosis not present

## 2018-09-19 MED ORDER — TRIAMCINOLONE ACETONIDE 0.1 % EX CREA: 1.0000 "application " | TOPICAL_CREAM | Freq: Two times a day (BID) | CUTANEOUS | 5 refills | Status: DC

## 2018-09-19 NOTE — Patient Instructions (Addendum)
If you have lab work done today you will be contacted with your lab results within the next 2 weeks.  If you have not heard from [place] then please contact us. The fastest way to get your results is to register for My Chart.   IF you received an x-ray today, you will receive an invoice from [facility]. Please contact [facility] at [phone] with questions or concerns regarding your invoice.   IF you received labwork today, you will receive an invoice from [place]. Please contact [facility] at [phone] with questions or concerns regarding your invoice.   Our billing staff will not be able to assist you with questions regarding bills from these companies.  You will be contacted with the lab results as soon as they are available. The fastest way to get your results is to activate your My Chart account. Instructions are located on the last page of this paperwork. If you have not heard from [place] regarding the results in 2 weeks, please contact this office.     Eczema Eczema is a broad term for a group of skin conditions that cause skin to become rough and inflamed. Each type of eczema has different triggers, symptoms, and treatments. Eczema of any type is usually itchy and symptoms range from mild to severe. Eczema and its symptoms are not spread from person to person (are not contagious). It can appear on different parts of the body at different times. Your eczema may not look the same as someone else's eczema. What are the types of eczema? Atopic dermatitis This is a long-term (chronic) skin disease that keeps coming back (recurring). Usual symptoms are dry skin and small, solid pimples that may swell and leak fluid (weep). Contact dermatitis  This happens when something irritates the skin and causes a rash. The irritation can come from substances that you are allergic to (allergens), such as poison ivy, chemicals, or medicines that were applied to your  skin. Dyshidrotic eczema This is a form of eczema on the hands and feet. It shows up as very itchy, fluid-filled blisters. It can affect people of any age, but is more common before age 70. Hand eczema  This causes very itchy areas of skin on the palms and sides of the hands and fingers. This type of eczema is common in industrial jobs where you may be exposed to many different types of irritants. Lichen simplex chronicus This type of eczema occurs when a person constantly scratches one area of the body. Repeated scratching of the area leads to thickened skin (lichenification). Lichen simplex chronicus can occur along with other types of eczema. It is more common in adults, but may be seen in children as well. Nummular eczema This is a common type of eczema. It has no known cause. It typically causes a red, circular, crusty lesion (plaque) that may be itchy. Scratching may become a habit and can cause bleeding. Nummular eczema occurs most often in people of middle-age or older. It most often affects the hands. Seborrheic dermatitis This is a common skin disease that mainly affects the scalp. It may also affect any oily areas of the body, such as the face, sides of nose, eyebrows, ears, eyelids, and chest. It is marked by small scaling and redness of the skin (erythema). This can affect people of all ages. In infants, this condition is known as Chartered certified accountant." Stasis dermatitis This is a common skin disease that usually appears on the legs and feet. It  most often occurs in people who have a condition that prevents blood from being pumped through the veins in the legs (chronic venous insufficiency). Stasis dermatitis is a chronic condition that needs long-term management. How is eczema diagnosed? Your health care provider will examine your skin and review your medical history. He or she may also give you skin patch tests. These tests involve taking patches that contain possible allergens and placing them  on your back. He or she will then check in a few days to see if an allergic reaction occurred. What are the common treatments? Treatment for eczema is based on the type of eczema you have. Hydrocortisone steroid medicine can relieve itching quickly and help reduce inflammation. This medicine may be prescribed or obtained over-the-counter, depending on the strength of the medicine that is needed. Follow these instructions at home:  Take over-the-counter and prescription medicines only as told by your health care provider.  Use creams or ointments to moisturize your skin. Do not use lotions.  Learn what triggers or irritates your symptoms. Avoid these things.  Treat symptom flare-ups quickly.  Do not itch your skin. This can make your rash worse.  Keep all follow-up visits as told by your health care provider. This is important. Where to find more information  The American Academy of Dermatology: www.aad.org  The National Eczema Association: www.nationaleczema.org Contact a health care provider if:  You have serious itching, even with treatment.  You regularly scratch your skin until it bleeds.  Your rash looks different than usual.  Your skin is painful, swollen, or more red than usual.  You have a fever. Summary  There are eight general types of eczema. Each type has different triggers.  Eczema of any type causes itching that may range from mild to severe.  Treatment varies based on the type of eczema you have. Hydrocortisone steroid medicine can help with itching and inflammation.  Protecting your skin is the best way to prevent eczema. Use moisturizers and lotions. Avoid triggers and irritants, and treat flare-ups quickly. This information is not intended to replace advice given to you by your health care provider. Make sure you discuss any questions you have with your health care provider. Document Released: 12/19/2016 Document Revised: 12/19/2016 Document Reviewed:  12/19/2016 Elsevier Interactive Patient Education  2019 [employer]  

## 2018-09-19 NOTE — Progress Notes (Signed)
Rodney Turner. 68 y.o.   Chief Complaint  Patient presents with  . rash on both forearms since Christmas    worsening, tried cortisone 10 and it helped with the itching, using caladryl now for symptoms.  Per pt the rash itches intermittently.  No new foods, lotions, soaps, detergents and fragrances.  Per pt used wife's triamcinolone cream and it cleared it up.      HISTORY OF PRESENT ILLNESS: This is a 68 y.o. male complaining of itchy rash to both forearms on and off for the past several weeks.  Unable to identify offending agent.  No change of medications.  Unclear allergen.  HPI   Prior to Admission medications   Medication Sig Start Date End Date Taking? Authorizing Provider  aspirin 81 MG tablet Take 81 mg by mouth daily.   Yes [provider]  atorvastatin (LIPITOR) 10 MG tablet Take 1 tablet (10 mg total) by mouth daily at 6 PM. 11/17/17  Yes Philemon Kingdom, MD  Blood Glucose Monitoring Suppl (ONE TOUCH ULTRA 2) w/Device KIT Use to check sugars daily. 06/03/16  Yes Philemon Kingdom, MD  fluticasone (FLONASE) 50 MCG/ACT nasal spray Place 2 sprays into both nostrils daily. 07/07/13  Yes Le, Thao P, DO  glipiZIDE (GLIPIZIDE XL) 5 MG 24 hr tablet Take 5 mg by mouth daily with breakfast.   Yes [provider]  glucose blood (ONE TOUCH ULTRA TEST) test strip Use as instructed check one time a day. 04/03/17  Yes Philemon Kingdom, MD  guaiFENesin (MUCINEX) 600 MG 12 hr tablet Take 1,200 mg by mouth 2 (two) times daily as needed for cough or to loosen phlegm.    Yes [provider]  ibuprofen (ADVIL,MOTRIN) 100 MG tablet Take 100 mg by mouth every 6 (six) hours as needed for pain.    Yes [provider]  Lancets (ONETOUCH ULTRASOFT) lancets Use as instructed check sugar one time daily. 04/03/17  Yes Philemon Kingdom, MD  metFORMIN (GLUCOPHAGE) 500 MG tablet TAKE 1 TABLET BY MOUTH TWICE A DAY WITH A MEAL 03/23/18  Yes Philemon Kingdom, MD  mupirocin  ointment (BACTROBAN) 2 % Sig to affected area twice a day x 7 days. 09/12/17  Yes Horald Pollen, MD    Allergies  Allergen Reactions  . Codeine Other (See Comments)    REACTION: nausea    Patient Active Problem List   Diagnosis Date Noted  . Type 2 diabetes mellitus with hyperglycemia, without long-term current use of insulin ([place]) 05/19/2018  . Eyelid cellulitis, left 09/12/2017  . Traumatic hematoma of right forearm 09/12/2017  . Acute left-sided low back pain with left-sided sciatica 11/25/2016  . Contusion of right shoulder 11/25/2016  . Acute lumbar myofascial strain 11/06/2016  . Acute left-sided low back pain without sciatica 11/06/2016  . Hyperlipidemia 06/06/2014  . LBBB (left bundle branch block) 06/06/2014  . Morbid obesity ([place]) 03/17/2014  . Obstructive sleep apnea 02/22/2008    Past Medical History:  Diagnosis Date  . Allergic rhinitis   . Diabetes mellitus ([place]) 05/24/2014  . History of kidney stones 1990's  . Morbid obesity ([place]) 03/17/2014  . OBSTRUCTIVE SLEEP APNEA 02/22/2008   HST 2013:  AHI 51/hr.  Optimal pressure 12cm on autotitration.    . OSA on CPAP     Past Surgical History:  Procedure Laterality Date  . KNEE SURGERY     left    Social History   Socioeconomic History  . Marital status: Married  Spouse name: Not on file  . Number of children: Not on file  . Years of education: Not on file  . Highest education level: Not on file  Occupational History  . Occupation: Geographical information systems officer  . Financial resource strain: Not on file  . Food insecurity:    Worry: Not on file    Inability: Not on file  . Transportation needs:    Medical: Not on file    Non-medical: Not on file  Tobacco Use  . Smoking status: Never Smoker  . Smokeless tobacco: Never Used  Substance and Sexual Activity  . Alcohol use: No  . Drug use: No  . Sexual activity: Yes    Birth control/protection: None  Lifestyle  . Physical activity:    Days per week: Not  on file    Minutes per session: Not on file  . Stress: Not on file  Relationships  . Social connections:    Talks on phone: Not on file    Gets together: Not on file    Attends religious service: Not on file    Active member of club or organization: Not on file    Attends meetings of clubs or organizations: Not on file    Relationship status: Not on file  . Intimate partner violence:    Fear of current or ex partner: Not on file    Emotionally abused: Not on file    Physically abused: Not on file    Forced sexual activity: Not on file  Other Topics Concern  . Not on file  Social History Narrative  . Not on file    Family History  Problem Relation Age of Onset  . Snoring Father   . Esophageal varices Father   . Diabetes Paternal Grandmother   . Heart disease Mother   . Gallstones Mother      Review of Systems  Constitutional: Negative.  Negative for chills and fever.  HENT: Negative.   Eyes: Negative for blurred vision and double vision.  Respiratory: Negative.  Negative for cough and shortness of breath.   Cardiovascular: Negative.  Negative for chest pain and palpitations.  Gastrointestinal: Negative.  Negative for abdominal pain, diarrhea, nausea and vomiting.  Skin: Positive for itching and rash.  Neurological: Negative.  Negative for dizziness and headaches.  Endo/Heme/Allergies: Negative.   All other systems reviewed and are negative.  Vitals:   09/19/18 1124  BP: 133/77  Pulse: 70  Resp: 16  Temp: 98.4 F (36.9 C)  SpO2: 95%     Physical Exam Vitals signs reviewed.  Constitutional:      Appearance: Normal appearance.  HENT:     Head: Normocephalic.  Cardiovascular:     Rate and Rhythm: Normal rate and regular rhythm.  Pulmonary:     Effort: Pulmonary effort is normal.  Musculoskeletal: Normal range of motion.  Skin:    General: Skin is warm and dry.     Capillary Refill: Capillary refill takes less than 2 seconds.     Findings: Rash present.    Neurological:     General: No focal deficit present.     Mental Status: He is alert and oriented to person, place, and time.        ASSESSMENT & PLAN: Rodney Turner was seen today for rash on both forearms since christmas.  Diagnoses and all orders for this visit:  Rash and nonspecific skin eruption -     triamcinolone cream (KENALOG) 0.1 %; Apply 1 application topically  2 (two) times daily for 7 days.  Colon cancer screening -     Ambulatory referral to Gastroenterology    Patient Instructions       If you have lab work done today you will be contacted with your lab results within the next 2 weeks.  If you have not heard from [place] then please contact us. The fastest way to get your results is to register for My Chart.   IF you received an x-ray today, you will receive an invoice from [facility]. Please contact [facility] at [phone] with questions or concerns regarding your invoice.   IF you received labwork today, you will receive an invoice from [facility]. Please contact [facility] at [phone] with questions or concerns regarding your invoice.   Our billing staff will not be able to assist you with questions regarding bills from these companies.  You will be contacted with the lab results as soon as they are available. The fastest way to get your results is to activate your My Chart account. Instructions are located on the last page of this paperwork. If you have not heard from [place] regarding the results in 2 weeks, please contact this office.     Eczema Eczema is a broad term for a group of skin conditions that cause skin to become rough and inflamed. Each type of eczema has different triggers, symptoms, and treatments. Eczema of any type is usually itchy and symptoms range from mild to severe. Eczema and its symptoms are not spread from person to person (are not contagious). It can appear on different parts of the body at different times. Your eczema may  not look the same as someone else's eczema. What are the types of eczema? Atopic dermatitis This is a long-term (chronic) skin disease that keeps coming back (recurring). Usual symptoms are dry skin and small, solid pimples that may swell and leak fluid (weep). Contact dermatitis  This happens when something irritates the skin and causes a rash. The irritation can come from substances that you are allergic to (allergens), such as poison ivy, chemicals, or medicines that were applied to your skin. Dyshidrotic eczema This is a form of eczema on the hands and feet. It shows up as very itchy, fluid-filled blisters. It can affect people of any age, but is more common before age 63. Hand eczema  This causes very itchy areas of skin on the palms and sides of the hands and fingers. This type of eczema is common in industrial jobs where you may be exposed to many different types of irritants. Lichen simplex chronicus This type of eczema occurs when a person constantly scratches one area of the body. Repeated scratching of the area leads to thickened skin (lichenification). Lichen simplex chronicus can occur along with other types of eczema. It is more common in adults, but may be seen in children as well. Nummular eczema This is a common type of eczema. It has no known cause. It typically causes a red, circular, crusty lesion (plaque) that may be itchy. Scratching may become a habit and can cause bleeding. Nummular eczema occurs most often in people of middle-age or older. It most often affects the hands. Seborrheic dermatitis This is a common skin disease that mainly affects the scalp. It may also affect any oily areas of the body, such as the face, sides of nose, eyebrows, ears, eyelids, and chest. It is marked by small scaling and redness of the skin (erythema). This can affect people of  all ages. In infants, this condition is known as Chartered certified accountant." Stasis dermatitis This is a common skin disease that  usually appears on the legs and feet. It most often occurs in people who have a condition that prevents blood from being pumped through the veins in the legs (chronic venous insufficiency). Stasis dermatitis is a chronic condition that needs long-term management. How is eczema diagnosed? Your health care provider will examine your skin and review your medical history. He or she may also give you skin patch tests. These tests involve taking patches that contain possible allergens and placing them on your back. He or she will then check in a few days to see if an allergic reaction occurred. What are the common treatments? Treatment for eczema is based on the type of eczema you have. Hydrocortisone steroid medicine can relieve itching quickly and help reduce inflammation. This medicine may be prescribed or obtained over-the-counter, depending on the strength of the medicine that is needed. Follow these instructions at home:  Take over-the-counter and prescription medicines only as told by your health care provider.  Use creams or ointments to moisturize your skin. Do not use lotions.  Learn what triggers or irritates your symptoms. Avoid these things.  Treat symptom flare-ups quickly.  Do not itch your skin. This can make your rash worse.  Keep all follow-up visits as told by your health care provider. This is important. Where to find more information  The American Academy of Dermatology: http://jones-macias.info/  The National Eczema Association: www.nationaleczema.org Contact a health care provider if:  You have serious itching, even with treatment.  You regularly scratch your skin until it bleeds.  Your rash looks different than usual.  Your skin is painful, swollen, or more red than usual.  You have a fever. Summary  There are eight general types of eczema. Each type has different triggers.  Eczema of any type causes itching that may range from mild to severe.  Treatment varies based on  the type of eczema you have. Hydrocortisone steroid medicine can help with itching and inflammation.  Protecting your skin is the best way to prevent eczema. Use moisturizers and lotions. Avoid triggers and irritants, and treat flare-ups quickly. This information is not intended to replace advice given to you by your health care provider. Make sure you discuss any questions you have with your health care provider. Document Released: 12/19/2016 Document Revised: 12/19/2016 Document Reviewed: 12/19/2016 Elsevier Interactive Patient Education  2019 Elsevier Inc.      Agustina Caroli, MD [facility]

## 2018-11-02 ENCOUNTER — Telehealth: Payer: Self-pay | Admitting: Internal Medicine

## 2018-11-02 DIAGNOSIS — E119 Type 2 diabetes mellitus without complications: Secondary | ICD-10-CM

## 2018-11-02 MED ORDER — ATORVASTATIN CALCIUM 10 MG PO TABS: 10.0000 mg | ORAL_TABLET | Freq: Every day | ORAL | 3 refills | Status: DC

## 2018-11-02 NOTE — Telephone Encounter (Signed)
RX sent

## 2018-11-02 NOTE — Addendum Note (Signed)
Addended by: Cardell Peach I on: 11/02/2018 01:19 PM   Modules accepted: Orders

## 2018-11-02 NOTE — Telephone Encounter (Signed)
MEDICATION: atorvastatin (LIPITOR) 10 MG tablet  PHARMACY:  [facility] [place], [place] - [address]  IS THIS A 90 DAY SUPPLY :  yes  IS PATIENT OUT OF MEDICATION:  no  IF NOT; HOW MUCH IS LEFT:  About 1 week left  LAST APPOINTMENT DATE: @10 /08/2017  NEXT APPOINTMENT DATE:@4 /08/2018  DO WE HAVE YOUR PERMISSION TO LEAVE A DETAILED MESSAGE:  OTHER COMMENTS:    **Let patient know to contact pharmacy at the end of the day to make sure medication is ready. **  ** Please notify patient to allow 48-72 hours to process**  **Encourage patient to contact the pharmacy for refills or they can request refills through [facility]**

## 2018-11-02 NOTE — Telephone Encounter (Signed)
Routing to Dr. Cruzita Lederer, is this a medication you RX for this patient?

## 2018-11-02 NOTE — Telephone Encounter (Signed)
OK for 3 mo with 3 refills

## 2018-11-09 ENCOUNTER — Encounter: Payer: Self-pay | Admitting: Internal Medicine

## 2018-11-12 ENCOUNTER — Encounter: Payer: Self-pay | Admitting: Emergency Medicine

## 2018-11-18 ENCOUNTER — Ambulatory Visit: Payer: Medicare Other | Admitting: Internal Medicine

## 2018-11-23 ENCOUNTER — Other Ambulatory Visit: Payer: Self-pay | Admitting: Internal Medicine

## 2018-11-23 DIAGNOSIS — E119 Type 2 diabetes mellitus without complications: Secondary | ICD-10-CM

## 2019-01-04 ENCOUNTER — Other Ambulatory Visit: Payer: Self-pay | Admitting: Internal Medicine

## 2019-01-07 ENCOUNTER — Other Ambulatory Visit: Payer: Self-pay | Admitting: Internal Medicine

## 2019-01-07 NOTE — Telephone Encounter (Signed)
MEDICATION: glucose blood (ONE TOUCH ULTRA TEST) test strip  PHARMACY:  [facility] [id number]  IS THIS A 90 DAY SUPPLY :   IS PATIENT OUT OF MEDICATION:   IF NOT; HOW MUCH IS LEFT:   LAST APPOINTMENT DATE: @5 /18/2020  NEXT APPOINTMENT DATE:@6 /09/2018  DO WE HAVE YOUR PERMISSION TO LEAVE A DETAILED MESSAGE:  OTHER COMMENTS:  Patient states that he requested a refill on his glipizide but has been taking more due to Dr.Gherghes orders but pharmacy is holding refill due to insurance.  Please Advise, Thanks   **Let patient know to contact pharmacy at the end of the day to make sure medication is ready. **  ** Please notify patient to allow 48-72 hours to process**  **Encourage patient to contact the pharmacy for refills or they can request refills through [facility]**

## 2019-01-08 MED ORDER — GLUCOSE BLOOD VI STRP: ORAL_STRIP | 3 refills | Status: DC

## 2019-01-08 MED ORDER — GLIPIZIDE ER 5 MG PO TB24: ORAL_TABLET | ORAL | 2 refills | Status: DC

## 2019-01-08 NOTE — Telephone Encounter (Deleted)
Glipizide XL can not be cut, ok to change?

## 2019-01-08 NOTE — Telephone Encounter (Signed)
OK to send it as 2x a day - but he should only take 1.5 tablets a day per my last note (plz make sure he is aware we are not increasing hte dose)

## 2019-01-08 NOTE — Telephone Encounter (Signed)
See MyChart message confirming what he is taking.  Updated RX sent.

## 2019-01-08 NOTE — Telephone Encounter (Signed)
Patient states that he requested a refill on his glipizide but has been taking more due to Dr.Gherghes orders but pharmacy is holding refill due to insurance.   Please advise, I do not see where in the chart that it was increased.

## 2019-01-12 ENCOUNTER — Telehealth: Payer: Self-pay | Admitting: Internal Medicine

## 2019-01-12 MED ORDER — GLUCOSE BLOOD VI STRP: ORAL_STRIP | 11 refills | Status: DC

## 2019-01-12 NOTE — Telephone Encounter (Signed)
That is a lie on the part of the pharmacy as I have received no such thing.  Resending RX.

## 2019-01-12 NOTE — Telephone Encounter (Signed)
Patient called re: the diagnostic information for glucose blood (ONE TOUCH ULTRA TEST) test strip for [facility] required by Medicare. [facility] told patient they requested the diagnostic information on 01/08/19. Please call patient at ph# [phone] or send a MyChart message to patient to advise.

## 2019-01-19 ENCOUNTER — Ambulatory Visit (INDEPENDENT_AMBULATORY_CARE_PROVIDER_SITE_OTHER): Payer: Medicare Other | Admitting: Internal Medicine

## 2019-01-19 ENCOUNTER — Encounter: Payer: Self-pay | Admitting: Internal Medicine

## 2019-01-19 ENCOUNTER — Other Ambulatory Visit: Payer: Self-pay

## 2019-01-19 DIAGNOSIS — E1122 Type 2 diabetes mellitus with diabetic chronic kidney disease: Secondary | ICD-10-CM

## 2019-01-19 DIAGNOSIS — E785 Hyperlipidemia, unspecified: Secondary | ICD-10-CM | POA: Diagnosis not present

## 2019-01-19 NOTE — Patient Instructions (Addendum)
Please continue: - Metformin 1000 mg at dinnertime  Please increase: - Glipizide ER 5 mg before b'fast  - Glipizide ER 2.5 mg before a regular dinner and 5 mg before a larger dinner (2 halves of a tablet)  Please return in 4 months with your sugar log.

## 2019-01-19 NOTE — Progress Notes (Signed)
Patient ID: Rodney Turner., male   DOB: 18-Mar-1951, 68 y.o.   MRN: [id number]   Patient location: Home My location: Office  I connected with the patient on 01/19/19 at  8:04 AM EDT by a video enabled telemedicine application and verified that I am speaking with the correct person.   I discussed the limitations of evaluation and management by telemedicine and the availability of in person appointments. The patient expressed understanding and agreed to proceed.   Details of the encounter are shown below. HPI: Rodney Turner. is a 68 y.o.-year-old male, initially referred by his PCP, Rodney Turner, presenting for f/u for DM2, dx in 03/2016, non-insulin-dependent, uncontrolled, with complications (mild CKD). His wife, Rodney Turner, is also my pt. Last visit was 8 months ago.  Reviewed history: In 01/2016, he started to feel very fatigued, frequent urination, nocturia, weight loss - saw PCP >> HbA1c very high (>14%). Prev. HbA1c levels were in the 6-7% range.  Last hemoglobin A1c: Lab Results  Component Value Date   HGBA1C 5.9 (A) 05/19/2018   HGBA1C 5.9 11/17/2017   HGBA1C 5.5 05/19/2017   Pt is on a regimen of: - Metformin 1000 mg at dinnertime - Glipizide ER 5 mg before b'fast and 2.5 mg before a large dinner or discharged  Pt checks his sugars 1-2 times a day: - am: 134-160, 164 (snacks at night) >> 120-152, 159 >> 150-180 - 2h after b'fast: n/c - before lunch: 100-131 >> 115-137, 171 >> <150 - 2h after lunch: 230 (double cheeseburger and fries) >> n/c  - before dinner: 80-114, 130, 149 >> 88-121 >> 100-110 - 2h after dinner: n/c >> 120-159 >> n/c - bedtime: n/c >> 149 >> <180 - nighttime: n/c Lowest sugar was 88 >> 98; he has hypoglycemia awareness in the 70s. Highest sugar was 171 >> 205.   Pt's meals are: - Breakfast: McD[employer]ds >> burrito - Lunch: grilled chicken sandwich - Dinner: Chick-fil[employer]a or [employer]'s salad or meat + veggies + starch - Snacks: 1: chips, popcorn,  fruit cups, fresh fruit  -+ Mild CKD. Last BUN/creatinine:  Lab Results  Component Value Date   BUN 14 05/19/2017   BUN 13 04/16/2016   CREATININE 1.13 05/19/2017   CREATININE 1.05 04/16/2016   -+ HL; last set of lipids: Lab Results  Component Value Date   CHOL 92 05/19/2017   HDL 32.60 (L) 05/19/2017   LDLCALC 24 05/19/2017   TRIG 179.0 (H) 05/19/2017   CHOLHDL 3 05/19/2017  On Lipitor 10. - last eye exam was in 01/2018: No DR, + astigmatism.  Rodney Turner. - nonumbness and tingling in his feet. On ASA 81.  He has OSA.  He retired 05/2017.  ROS: Constitutional: no weight gain/no weight loss, no fatigue, no subjective hyperthermia, no subjective hypothermia Eyes: no blurry vision, no xerophthalmia ENT: no sore throat, no nodules palpated in neck, no dysphagia, no odynophagia, no hoarseness Cardiovascular: no CP/no SOB/no palpitations/no leg swelling Respiratory: no cough/no SOB/no wheezing Gastrointestinal: no N/no V/no D/no C/no acid reflux Musculoskeletal: no muscle aches/no joint aches Skin: no rashes, no hair loss Neurological: no tremors/no numbness/no tingling/no dizziness  I reviewed pt's medications, allergies, PMH, social hx, family hx, and changes were documented in the history of present illness. Otherwise, unchanged from my initial visit note.   Past Medical History:  Diagnosis Date  . Allergic rhinitis   . Diabetes mellitus ([place]) 05/24/2014  . History of kidney stones 1990's  . Morbid obesity ([place]) 03/17/2014  .  OBSTRUCTIVE SLEEP APNEA 02/22/2008   HST 2013:  AHI 51/hr.  Optimal pressure 12cm on autotitration.    . OSA on CPAP    Past Surgical History:  Procedure Laterality Date  . KNEE SURGERY     left   Social History   Social History  . Marital status: Married    Spouse name: N/A  . Number of children: 2   Occupational History  . sales    Social History Main Topics  . Smoking status: Never Smoker  . Smokeless tobacco: Never Used  .  Alcohol use No  . Drug use: No   Current Outpatient Medications on File Prior to Visit  Medication Sig Dispense Refill  . aspirin 81 MG tablet Take 81 mg by mouth daily.    Rodney Turner atorvastatin (LIPITOR) 10 MG tablet TAKE 1 TABLET (10 MG TOTAL) BY MOUTH DAILY AT 6 PM. 90 tablet 3  . Blood Glucose Monitoring Suppl (ONE TOUCH ULTRA 2) w/Device KIT Use to check sugars daily. 1 each 0  . fluticasone (FLONASE) 50 MCG/ACT nasal spray Place 2 sprays into both nostrils daily. 16 g 11  . glipiZIDE (GLUCOTROL XL) 5 MG 24 hr tablet Take 5 mg  before breakfast and  2.5 mg before dinner if having large meal. 90 tablet 2  . glucose blood (ONE TOUCH ULTRA TEST) test strip Use as instructed check one time a day. 100 each 11  . guaiFENesin (MUCINEX) 600 MG 12 hr tablet Take 1,200 mg by mouth 2 (two) times daily as needed for cough or to loosen phlegm.     Rodney Turner ibuprofen (ADVIL,MOTRIN) 100 MG tablet Take 100 mg by mouth every 6 (six) hours as needed for pain.     . Lancets (ONETOUCH ULTRASOFT) lancets Use as instructed check sugar one time daily. 100 each 12  . metFORMIN (GLUCOPHAGE) 500 MG tablet TAKE 1 TABLET BY MOUTH TWICE A DAY WITH A MEAL 180 tablet 3  . mupirocin ointment (BACTROBAN) 2 % Sig to affected area twice a day x 7 days. 22 g 1   No current facility-administered medications on file prior to visit.    Allergies  Allergen Reactions  . Codeine Other (See Comments)    REACTION: nausea   Family History  Problem Relation Age of Onset  . Snoring Father   . Esophageal varices Father   . Diabetes Paternal Grandmother   . Heart disease Mother   . Gallstones Mother    PE: There were no vitals taken for this visit. Wt Readings from Last 3 Encounters:  09/19/18 265 lb 6.4 oz (120.4 kg)  05/19/18 267 lb (121.1 kg)  11/17/17 264 lb 3.2 oz (119.8 kg)   Constitutional:  in NAD  The physical exam was not performed (virtual visit).  ASSESSMENT: 1. DM2, non-insulin-dependent, uncontrolled, with  complications -Mild CKD  2. HL  3. Obesity class 3 BMI Classification:  < 18.5 underweight   18.5-24.9 normal weight   25.0-29.9 overweight   30.0-34.9 class I obesity   35.0-39.9 class II obesity   ? 40.0 class III obesity   PLAN:  1. Patient with controlled type 2 diabetes, on metformin and glipizide, with higher blood sugars in the morning at last visit, but at goal later in the day.  At that time, we discussed the problem with getting slight before dinner, as he was taking it after a large dinner.  We also discussed about improving his diet.  At that time, HbA1c was 6 7, and 5.9% -  At this visit, due to the coronavirus pandemic, he is more stressed and is eating more.  Sugars are higher, especially in the morning.  We discussed about possible causes for this: Increased insulin resistance with subsequent increased hepatic gluconeogenesis, dawn phenomenon, high-protein meals at night, alcoholic drinks.  I advised him to try to have lighter meals at night and try to stay active, however, we will also increase the glipizide to 2.5 mg before regular dinner at 5 mg before a larger dinner.  He did observe that when he was taking glipizide before dinner his sugars are better in the morning. - I suggested to:  Patient Instructions  Please continue: - Metformin 1000 mg at dinnertime  Please increase: - Glipizide ER 5 mg before b'fast  - Glipizide ER 2.5 mg before a regular dinner and 5 mg before a larger dinner (2 halves of a tablet)  Please return in 4 months with your sugar log.  -We will recheck his HbA1c at next visit - continue checking sugars at different times of the day - check 1x a day, rotating checks - advised for yearly eye exams >> he is UTD - Return to clinic in 4 mo with sugar log      2. HL - Reviewed latest lipid panel from 2018: LDL at goal, triglycerides slightly high, HDL low Lab Results  Component Value Date   CHOL 92 05/19/2017   HDL 32.60 (L) 05/19/2017    LDLCALC 24 05/19/2017   TRIG 179.0 (H) 05/19/2017   CHOLHDL 3 05/19/2017  - Continues Lipitor without side effects. - He did not stop at the lab last visit to recheck his lipids.  We will do so at next visit.  3.  Obesity class 3 -We discussed at last visit about cutting out snacks, moving sweets earlier in the day if he has to have them, and cutting down fatty foods. - since the pandemic, he gained a little weight  Philemon Kingdom, MD PhD [facility]

## 2019-03-17 ENCOUNTER — Other Ambulatory Visit: Payer: Self-pay | Admitting: Internal Medicine

## 2019-05-27 ENCOUNTER — Encounter: Payer: Self-pay | Admitting: Internal Medicine

## 2019-05-27 ENCOUNTER — Ambulatory Visit (INDEPENDENT_AMBULATORY_CARE_PROVIDER_SITE_OTHER): Payer: Medicare Other | Admitting: Internal Medicine

## 2019-05-27 ENCOUNTER — Other Ambulatory Visit: Payer: Self-pay

## 2019-05-27 DIAGNOSIS — E1122 Type 2 diabetes mellitus with diabetic chronic kidney disease: Secondary | ICD-10-CM | POA: Diagnosis not present

## 2019-05-27 DIAGNOSIS — E785 Hyperlipidemia, unspecified: Secondary | ICD-10-CM

## 2019-05-27 NOTE — Progress Notes (Signed)
Patient ID: Rodney Turner., male   DOB: [birth year], 68 y.o.   MRN: [id number]   Patient location: Car, driving from [place] My location: Office  Referring Provider: Horald Pollen, MD  I connected with the patient on 05/27/19 at  3:24 PM EDT by a video enabled telemedicine application and verified that I am speaking with the correct person.   I discussed the limitations of evaluation and management by telemedicine and the availability of in person appointments. The patient expressed understanding and agreed to proceed.   Details of the encounter are shown below.  HPI: Rodney Turner. is a 68 y.o.-year-old male, initially referred by his PCP, Dr. Everlene Farrier, presenting for f/u for DM2, dx in 03/2016, non-insulin-dependent, uncontrolled, with complications (mild CKD). His wife, Tavius Turgeon, is also my pt. Last visit was 4 months ago (virtual).  Reviewed history: In 01/2016, he started to feel very fatigued, frequent urination, nocturia, weight loss - saw PCP >> HbA1c very high (>14%). Prev. HbA1c levels were in the 6-7% range.  Last hemoglobin A1c: Lab Results  Component Value Date   HGBA1C 5.9 (A) 05/19/2018   HGBA1C 5.9 11/17/2017   HGBA1C 5.5 05/19/2017   Pt is on a regimen of: - Metformin 1000 mg at dinnertime - Glipizide ER 5 mg before b'fast  - Glipizide ER 2.5 mg before a regular dinner and 5 mg before a larger dinner (2 halves of a tablet)  Pt checks his sugars 1-2 times a day: - am:  120-152, 159 >> 150-180 >> 140-175, 200 - 2h after b'fast: n/c - before lunch: 100-131 >> 115-137, 171 >> <150 >> n/c - 2h after lunch: 230 (double cheeseburger and fries) >> n/c  - before dinner:  88-121 >> 100-110 >> 110-120, 140 - 2h after dinner: n/c >> 120-159 >> n/c - bedtime: n/c >> 149 >> <180 >> 175, 180 - nighttime: n/c Lowest sugar was 88 >> 98 >> 103; he has hypoglycemia awareness in the 70s. Highest sugar was 171 >> 205 >> 200s.   Pt's meals are: - Breakfast:  [employer] >> burrito - Lunch: grilled chicken sandwich - Dinner: [employer] or [employer] salad or meat + veggies + starch - Snacks: 1: chips, popcorn, fruit cups, fresh fruit  -+ Mild CKD. Last BUN/creatinine:  Lab Results  Component Value Date   BUN 14 05/19/2017   BUN 13 04/16/2016   CREATININE 1.13 05/19/2017   CREATININE 1.05 04/16/2016   -+ HL; last set of lipids: Lab Results  Component Value Date   CHOL 92 05/19/2017   HDL 32.60 (L) 05/19/2017   LDLCALC 24 05/19/2017   TRIG 179.0 (H) 05/19/2017   CHOLHDL 3 05/19/2017  On Lipitor 10. - last eye exam was in 01/2018: No DR.Dr. Sabra Heck. - no numbness and tingling in his feet. On ASA 81.  He has OSA.  He retired 05/2017.  ROS: Constitutional: no weight gain/no weight loss, no fatigue, no subjective hyperthermia, no subjective hypothermia Eyes: no blurry vision, no xerophthalmia ENT: no sore throat, no nodules palpated in neck, no dysphagia, no odynophagia, no hoarseness Cardiovascular: no CP/no SOB/no palpitations/no leg swelling Respiratory: no cough/no SOB/no wheezing Gastrointestinal: no N/no V/no D/no C/no acid reflux Musculoskeletal: no muscle aches/no joint aches Skin: no rashes, no hair loss Neurological: no tremors/no numbness/no tingling/no dizziness  I reviewed pt's medications, allergies, PMH, social hx, family hx, and changes were documented in the history of present illness. Otherwise, unchanged from my initial visit note.   Past  Medical History:  Diagnosis Date  . Allergic rhinitis   . Diabetes mellitus ([place]) 05/24/2014  . History of kidney stones 1990's  . Morbid obesity ([place]) 03/17/2014  . OBSTRUCTIVE SLEEP APNEA 02/22/2008   HST 2013:  AHI 51/hr.  Optimal pressure 12cm on autotitration.    . OSA on CPAP    Past Surgical History:  Procedure Laterality Date  . KNEE SURGERY     left   Social History   Social History  . Marital status: Married    Spouse name: N/A  . Number of children: 2    Occupational History  . sales    Social History Main Topics  . Smoking status: Never Smoker  . Smokeless tobacco: Never Used  . Alcohol use No  . Drug use: No   Current Outpatient Medications on File Prior to Visit  Medication Sig Dispense Refill  . aspirin 81 MG tablet Take 81 mg by mouth daily.    Rodney Kitchen atorvastatin (LIPITOR) 10 MG tablet TAKE 1 TABLET (10 MG TOTAL) BY MOUTH DAILY AT 6 PM. 90 tablet 3  . Blood Glucose Monitoring Suppl (ONE TOUCH ULTRA 2) w/Device KIT Use to check sugars daily. 1 each 0  . fluticasone (FLONASE) 50 MCG/ACT nasal spray Place 2 sprays into both nostrils daily. 16 g 11  . glipiZIDE (GLUCOTROL XL) 5 MG 24 hr tablet Take 5 mg  before breakfast and  2.5 mg before dinner if having large meal. 90 tablet 2  . glucose blood (ONE TOUCH ULTRA TEST) test strip Use as instructed check one time a day. 100 each 11  . guaiFENesin (MUCINEX) 600 MG 12 hr tablet Take 1,200 mg by mouth 2 (two) times daily as needed for cough or to loosen phlegm.     Rodney Kitchen ibuprofen (ADVIL,MOTRIN) 100 MG tablet Take 100 mg by mouth every 6 (six) hours as needed for pain.     . Lancets (ONETOUCH ULTRASOFT) lancets Use as instructed check sugar one time daily. 100 each 12  . metFORMIN (GLUCOPHAGE) 500 MG tablet TAKE 1 TABLET BY MOUTH TWICE A DAY WITH MEALS 180 tablet 3  . mupirocin ointment (BACTROBAN) 2 % Sig to affected area twice a day x 7 days. 22 g 1   No current facility-administered medications on file prior to visit.    Allergies  Allergen Reactions  . Codeine Other (See Comments)    REACTION: nausea   Family History  Problem Relation Age of Onset  . Snoring Father   . Esophageal varices Father   . Diabetes Paternal Grandmother   . Heart disease Mother   . Gallstones Mother    PE: There were no vitals taken for this visit. Wt Readings from Last 3 Encounters:  09/19/18 265 lb 6.4 oz (120.4 kg)  05/19/18 267 lb (121.1 kg)  11/17/17 264 lb 3.2 oz (119.8 kg)   Constitutional:   in NAD  The physical exam was not performed (virtual visit).  ASSESSMENT: 1. DM2, non-insulin-dependent, uncontrolled, with complications -Mild CKD  2. HL  3. Obesity class 3 BMI Classification:  < 18.5 underweight   18.5-24.9 normal weight   25.0-29.9 overweight   30.0-34.9 class I obesity   35.0-39.9 class II obesity   ? 40.0 class III obesity   PLAN:  1. Patient with controlled type 2 diabetes, on metformin and glipizide, despite high blood sugars at last visit due to the coronavirus pandemic, being stressed, and eating more.  Sugars are higher especially in the morning.  We  discussed about possible reasons for this and discussed about having lighter meals at night and try to stay active during the day.  We increased the glipizide to 2.5 mg before a regular dinner and 5 mg before larger dinner. - at this visit, sugars are higher, but started to improve after he was more active at his son's house.  We discussed that it is important for him to stay active and restart walking.  He plans to do so, now that the weather is cooler. -In the meantime, since his sugars are high in the morning every time he has a late dinner, will increase his glipizide before dinner to 5 mg.  He is currently almost exclusively using the 2.5 mg dose.  We will not change the rest of the regimen.  At next visit, we may need to add an SGLT 2 inhibitor. - I suggested to:  Patient Instructions  Please continue: - Metformin 1000 mg at dinnertime - Glipizide ER 5 mg before b'fast   Please increase: - Glipizide ER 5 mg before a dinner (2 halves of a tablet)  Please return in 4 months with your sugar log.  - we would check his HbA1c when he returns to the clinic - advised to check sugars at different times of the day - 1x a day, rotating check times - advised for yearly eye exams >> he is not UTD - return to clinic in 4 months      2. HL - Reviewed latest lipid panel from 2018: LDL at goal,  triglycerides slightly high, HDL low Lab Results  Component Value Date   CHOL 92 05/19/2017   HDL 32.60 (L) 05/19/2017   LDLCALC 24 05/19/2017   TRIG 179.0 (H) 05/19/2017   CHOLHDL 3 05/19/2017  - Continues Lipitor without side effects. -We need another lipid panel when he returns to the clinic  3.  Obesity class 3 -No weight loss since last visit -We again discussed about the importance of cutting out snacks and fatty foods and restarted exercise  Philemon Kingdom, MD PhD [facility] Endocrinology

## 2019-05-27 NOTE — Patient Instructions (Addendum)
  Please continue: - Metformin 1000 mg at dinnertime - Glipizide ER 5 mg before b'fast   Please increase: - Glipizide ER 5 mg before a dinner (2 halves of a tablet)  Please return in 4 months with your sugar log.

## 2019-06-25 ENCOUNTER — Other Ambulatory Visit: Payer: Self-pay | Admitting: Internal Medicine

## 2019-07-20 ENCOUNTER — Telehealth: Payer: Self-pay

## 2019-07-20 NOTE — Telephone Encounter (Signed)
Patient called in wanting to speak with Dr about his numbers. States they have bern high in the 200's. He has concerns and would like to speak with someone    Please call and advise

## 2019-07-21 NOTE — Telephone Encounter (Signed)
These are quite high!  At this point, I am trying to avoid insulin, but I would suggest to start Ozempic 0.25 mg weekly for 4 weeks and then increase to 0.5 mg weekly, as in addition to his current regimen.  Would he agree to do so?  This is an injection and I do not think he used injections before.  He may need to come for a nurse visit to show him how to do it.  If he agrees to try it, can you please send 2 pens of Ozempic to his pharmacy with 5 refills.  Possible side effects: Nausea, which usually resolves after the first injection.  Also, please make sure he does not have a history of pancreatitis or a family history of medullary thyroid cancer before we start.

## 2019-07-21 NOTE — Telephone Encounter (Signed)
Spoke with patient and read message from Dr. Cruzita Lederer, he is very unsure about doing any kind of injections at this time and would like to know if he can have some time to try and get them back to normal first?

## 2019-07-21 NOTE — Telephone Encounter (Signed)
Left message for patient to return our call at [phone].  Will also copy this to him in Mychart as I am off early today and I want to make sure he gets the information.

## 2019-07-21 NOTE — Telephone Encounter (Signed)
The sugars are little too high so I am concerned that oral medications are not enough for now.  He can definitely watch him for the next day or so, but I would suggest either Ozempic or insulin afterwards.  Please asked him to keep in touch with [place].

## 2019-07-21 NOTE — Telephone Encounter (Signed)
Spoke with patient who states his sugars have been running in the 200's Patient does state he over indulged for Thanksgiving but has been back to watching his diet.  Yesterday readings:  235 when he woke up in the AM (he states he does not eat anything for breakfast if AM sugars are high)  Patient had a [place] sandwich at lunch but did not check blood sugar.  Dinner patient has chili and apple pecan salad from [facility] and blood sugar before bed 208  This AM his blood sugar was 235.  At night patient states he take 2 Metformin, 1 whole glipizide an two half tabs of glipizide.

## 2019-07-21 NOTE — Telephone Encounter (Signed)
Notified patient of message from Dr. Cruzita Lederer, patient expressed understanding and agreement. No further questions.  He will contact us Friday or Monday with readings.

## 2019-07-26 ENCOUNTER — Telehealth: Payer: Self-pay

## 2019-07-26 NOTE — Telephone Encounter (Signed)
Spoke to patient, he started walking more each day and getting back to watching what he eats.  Blood sugars are coming down, 150 in the morning 130 in the evening.  Patient will update [place] in a few more days to make sure it is still going down. Patient also wanted to know if it is bad to skip eating in the morning if sugars are elevated?

## 2019-07-26 NOTE — Telephone Encounter (Signed)
GREAT! No, it should be OK if he is not hungry and if he is not dropping too low later.

## 2019-08-03 DIAGNOSIS — Z20828 Contact with and (suspected) exposure to other viral communicable diseases: Secondary | ICD-10-CM | POA: Diagnosis not present

## 2019-09-02 ENCOUNTER — Telehealth: Payer: Self-pay | Admitting: Pulmonary Disease

## 2019-09-02 DIAGNOSIS — G4733 Obstructive sleep apnea (adult) (pediatric): Secondary | ICD-10-CM

## 2019-09-02 NOTE — Telephone Encounter (Signed)
Spoke with patient. He is aware that [facility] has approved the order for the CPAP supplies. Will place order today.   Nothing further needed at time of call.

## 2019-09-02 NOTE — Telephone Encounter (Signed)
Spoke with the pt  He is needing new CPAP supplies  He was last seen in Oct 2018  He has appt set for 09/16/19  He needs [place] to send order now for supplies bc his dog ate his headgear  Please advise if okay to send order now thanks

## 2019-09-02 NOTE — Telephone Encounter (Signed)
Okay to send CPAP supplies

## 2019-09-07 ENCOUNTER — Telehealth: Payer: Self-pay | Admitting: Pulmonary Disease

## 2019-09-07 NOTE — Telephone Encounter (Signed)
I concur..the order was signed by Dr. Elsworth Soho on 1/15 & was not sent to DME until his signature had been obtained.

## 2019-09-07 NOTE — Telephone Encounter (Signed)
The pt was told today that the order still hadn't been signed.

## 2019-09-07 NOTE — Telephone Encounter (Signed)
Response from [employer]:  Elon Alas sent to Harland Dingwall, Melissa; [place], Jeanie Cooks, Stephani Police  It is definitely signed. It is on page 2 of the referral (once printed). I have sent a copy again and advised them to contact the patient today.

## 2019-09-07 NOTE — Telephone Encounter (Signed)
Spoke with pt. He states that the order that was placed for CPAP supplies last week hasn't been signed per [employer]. The order that is in our system is signed by Dr. Elsworth Soho >> 09/03/2019 at 1016.  [facility] - can you please look in to this? Thanks.

## 2019-09-07 NOTE — Telephone Encounter (Signed)
Sent the following high priority message to [employer]:   Rodney Turner sent to [name]; [place], Patrice [place]  DOB: 2051-06-12   Order placed by Dr. Elsworth Soho & sent to you on 1/15. Pt called into the office today stating the following:   Spoke with pt.  He states that the order that was placed for CPAP supplies last week hasn't been signed per [employer].  The order that is in our system is signed by Dr. Elsworth Soho >> 09/03/2019 at 1016.   Can you please provide an update on this as I am showing that [name] did NOT send the order on 1/15 until after Dr. Elsworth Soho signed the order....   Thank you,  [facility]

## 2019-09-07 NOTE — Telephone Encounter (Signed)
I called pt to advise him of message from [employer].. Pt understood and nothing further is needed. He is going to call [employer] now.

## 2019-09-16 ENCOUNTER — Telehealth: Payer: Self-pay | Admitting: Pulmonary Disease

## 2019-09-16 ENCOUNTER — Ambulatory Visit (INDEPENDENT_AMBULATORY_CARE_PROVIDER_SITE_OTHER): Payer: Medicare Other | Admitting: Pulmonary Disease

## 2019-09-16 ENCOUNTER — Encounter: Payer: Self-pay | Admitting: Pulmonary Disease

## 2019-09-16 ENCOUNTER — Other Ambulatory Visit: Payer: Self-pay

## 2019-09-16 ENCOUNTER — Ambulatory Visit: Payer: Medicare Other | Admitting: Pulmonary Disease

## 2019-09-16 DIAGNOSIS — G4733 Obstructive sleep apnea (adult) (pediatric): Secondary | ICD-10-CM | POA: Diagnosis not present

## 2019-09-16 NOTE — Assessment & Plan Note (Signed)
CPAP supplies will be renewed. He will bring in his SD card so that we can review download report on CPAP He will be  eligible for a new machine -12 cm current machine breaks down  Weight loss encouraged, compliance with goal of at least 4-6 hrs every night is the expectation. Advised against medications with sedative side effects Cautioned against driving when sleepy - understanding that sleepiness will vary on a day to day basis

## 2019-09-16 NOTE — Assessment & Plan Note (Signed)
Weight loss encouraged 

## 2019-09-16 NOTE — Telephone Encounter (Signed)
Called and spoke with Patient. Patient was told to bring his SD card to OV today, but he is a telephone visit with Dr. Elsworth Soho.   Patient stated he could come by office later on, and bring SD card for download. Patient stated he would let Dr. Elsworth Soho know he could bring SD card at a later time.. Nothing further at this time.

## 2019-09-16 NOTE — Progress Notes (Signed)
I connected with  Rodney Turner. on 09/16/19 by phone and verified that I am speaking with the correct person using two identifiers.   I discussed the limitations of evaluation and management by telemedicine. The patient expressed understanding and agreed to proceed.    Chief Complaint  Patient presents with  . Follow-up    Televisit- still on CPAP, went 3 wks without using properly due to head gear being torn up by his dog. He has the supplies he needs now and CPAP is working well.     69 yo obese man for follow-up of OSA  Reviewed last OV, phone notes Settled with nasal pillows,   Is working well except that he had difficulty getting supplies since his machine is old. Unable to get download report, he generally brings in the ST card so that we can review  Overall feels rested, denies excessive daytime sleepiness. Weight is unchanged, no new medical issues   Significant tests/ events reviewed  HST 2013: AHI 51/hr.  Optimal pressure 12cm on autotitration.    neg for any significant sore throat, dysphagia, itching, sneezing, nasal congestion or excess/ purulent secretions, fever, chills, sweats, unintended wt loss, pleuritic or exertional cp, hempoptysis, orthopnea pnd or change in chronic leg swelling. Also denies presyncope, palpitations, heartburn, abdominal pain, nausea, vomiting, diarrhea or change in bowel or urinary habits, dysuria,hematuria, rash, arthralgias, visual complaints, headache, numbness weakness or ataxia.   Total encounter time x 87m

## 2019-09-27 ENCOUNTER — Ambulatory Visit (INDEPENDENT_AMBULATORY_CARE_PROVIDER_SITE_OTHER): Payer: Medicare Other | Admitting: Emergency Medicine

## 2019-09-27 ENCOUNTER — Other Ambulatory Visit: Payer: Self-pay | Admitting: Internal Medicine

## 2019-09-27 ENCOUNTER — Other Ambulatory Visit: Payer: Self-pay

## 2019-09-27 ENCOUNTER — Encounter: Payer: Self-pay | Admitting: Emergency Medicine

## 2019-09-27 VITALS — BP 114/73 | HR 66 | Temp 98.0°F | Resp 16 | Ht 67.0 in | Wt 264.0 lb

## 2019-09-27 DIAGNOSIS — E1169 Type 2 diabetes mellitus with other specified complication: Secondary | ICD-10-CM

## 2019-09-27 DIAGNOSIS — G4733 Obstructive sleep apnea (adult) (pediatric): Secondary | ICD-10-CM

## 2019-09-27 DIAGNOSIS — E1122 Type 2 diabetes mellitus with diabetic chronic kidney disease: Secondary | ICD-10-CM

## 2019-09-27 DIAGNOSIS — L03116 Cellulitis of left lower limb: Secondary | ICD-10-CM | POA: Diagnosis not present

## 2019-09-27 DIAGNOSIS — E785 Hyperlipidemia, unspecified: Secondary | ICD-10-CM

## 2019-09-27 MED ORDER — CEFADROXIL 500 MG PO CAPS: 500.0000 mg | ORAL_CAPSULE | Freq: Two times a day (BID) | ORAL | 0 refills | Status: AC

## 2019-09-27 NOTE — Patient Instructions (Addendum)
   If you have lab work done today you will be contacted with your lab results within the next 2 weeks.  If you have not heard from us then please contact us. The fastest way to get your results is to register for My Chart.   IF you received an x-ray today, you will receive an invoice from [facility]. Please contact [facility] at [phone] with questions or concerns regarding your invoice.   IF you received labwork today, you will receive an invoice from [facility]. Please contact [facility] at [phone] with questions or concerns regarding your invoice.   Our billing staff will not be able to assist you with questions regarding bills from these companies.  You will be contacted with the lab results as soon as they are available. The fastest way to get your results is to activate your My Chart account. Instructions are located on the last page of this paperwork. If you have not heard from us regarding the results in 2 weeks, please contact this office.     Cellulitis, Adult  Cellulitis is a skin infection. The infected area is often warm, red, swollen, and sore. It occurs most often in the arms and lower legs. It is very important to get treated for this condition. What are the causes? This condition is caused by bacteria. The bacteria enter through a break in the skin, such as a cut, burn, insect bite, open sore, or crack. What increases the risk? This condition is more likely to occur in people who:  Have a weak body defense system (immune system).  Have open cuts, burns, bites, or scrapes on the skin.  Are older than 70 years of age.  Have a blood sugar problem (diabetes).  Have a long-lasting (chronic) liver disease (cirrhosis) or kidney disease.  Are very overweight (obese).  Have a skin problem, such as: ? Itchy rash (eczema). ? Slow movement of blood in the veins (venous stasis). ? Fluid buildup below the skin (edema).  Have been treated with  high-energy rays (radiation).  Use IV drugs. What are the signs or symptoms? Symptoms of this condition include:  Skin that is: ? Red. ? Streaking. ? Spotting. ? Swollen. ? Sore or painful when you touch it. ? Warm.  A fever.  Chills.  Blisters. How is this diagnosed? This condition is diagnosed based on:  Medical history.  Physical exam.  Blood tests.  Imaging tests. How is this treated? Treatment for this condition may include:  Medicines to treat infections or allergies.  Home care, such as: ? Rest. ? Placing cold or warm cloths (compresses) on the skin.  Hospital care, if the condition is very bad. Follow these instructions at home: Medicines  Take over-the-counter and prescription medicines only as told by your doctor.  If you were prescribed an antibiotic medicine, take it as told by your doctor. Do not stop taking it even if you start to feel better. General instructions   Drink enough fluid to keep your pee (urine) pale yellow.  Do not touch or rub the infected area.  Raise (elevate) the infected area above the level of your heart while you are sitting or lying down.  Place cold or warm cloths on the area as told by your doctor.  Keep all follow-up visits as told by your doctor. This is important. Contact a doctor if:  You have a fever.  You do not start to get better after 1-2 days of treatment.  Your bone   bone or joint under the infected area starts to hurt after the skin has healed.  Your infection comes back. This can happen in the same area or another area.  You have a swollen bump in the area.  You have new symptoms.  You feel ill and have muscle aches and pains. Get help right away if:  Your symptoms get worse.  You feel very sleepy.  You throw up (vomit) or have watery poop (diarrhea) for a long time.  You see red streaks coming from the area.  Your red area gets larger.  Your red area turns dark in color. These symptoms  may represent a serious problem that is an emergency. Do not wait to see if the symptoms will go away. Get medical help right away. Call your local emergency services (911 in the [place] Do not drive yourself to the hospital. Summary  Cellulitis is a skin infection. The area is often warm, red, swollen, and sore.  This condition is treated with medicines, rest, and cold and warm cloths.  Take all medicines only as told by your doctor.  Tell your doctor if symptoms do not start to get better after 1-2 days of treatment. This information is not intended to replace advice given to you by your health care provider. Make sure you discuss any questions you have with your health care provider. Document Revised: 12/25/2017 Document Reviewed: 12/25/2017 Elsevier Patient Education  2020 [employer]  

## 2019-09-27 NOTE — Progress Notes (Signed)
Rodney Turner. 69 y.o.   Chief Complaint  Patient presents with  . Rash    per pt mpt a rash, but a spot that hurts sometimes; pti is a Diabetic    HISTORY OF PRESENT ILLNESS: This is a 69 y.o. male diabetic complaining of rash to left ankle area for the past 2 to 3 weeks.  Denies injury.  No other associated symptoms. [name] has a history of diabetes and sees Dr. Cruzita Lederer regularly.  On metformin and glipizide. History of sleep apnea on CPAP therapy. History of dyslipidemia and morbid obesity. Health maintenance items reviewed with patient.  Waiting on Covid vaccine so prefers to wait on that before getting pneumonia vaccine. Will schedule eye exam with Dr. Sabra Heck. Will schedule colonoscopy with [facility]. No other complaints or medical concerns today.  HPI   Prior to Admission medications   Medication Sig Start Date End Date Taking? Authorizing Provider  aspirin 81 MG tablet Take 81 mg by mouth daily.    [provider]  atorvastatin (LIPITOR) 10 MG tablet TAKE 1 TABLET (10 MG TOTAL) BY MOUTH DAILY AT 6 PM. 11/23/18   Philemon Kingdom, MD  Blood Glucose Monitoring Suppl (ONE TOUCH ULTRA 2) w/Device KIT Use to check sugars daily. 06/03/16   Philemon Kingdom, MD  fluticasone (FLONASE) 50 MCG/ACT nasal spray Place 2 sprays into both nostrils daily. Patient taking differently: Place 2 sprays into both nostrils daily. As needed 07/07/13   Le, Thao P, DO  glipiZIDE (GLIPIZIDE XL) 5 MG 24 hr tablet TAKE 1 TABLET BY MOUTH ONCE DAILY BEFORE BREAKFAST AND  1/2  (ONE-HALF) ONCE DAILY BEFORE SUPPER IF HAVING LARGE MEAL. 06/25/19   Philemon Kingdom, MD  glucose blood (ONE TOUCH ULTRA TEST) test strip Use as instructed check one time a day. 01/12/19   Philemon Kingdom, MD  guaiFENesin (MUCINEX) 600 MG 12 hr tablet Take 1,200 mg by mouth 2 (two) times daily as needed for cough or to loosen phlegm.     [provider]  ibuprofen (ADVIL,MOTRIN) 100 MG tablet Take 100 mg by  mouth every 6 (six) hours as needed for pain.     [provider]  Lancets [facility] ULTRASOFT) lancets Use as instructed check sugar one time daily. 04/03/17   Philemon Kingdom, MD  metFORMIN (GLUCOPHAGE) 500 MG tablet TAKE 1 TABLET BY MOUTH TWICE A DAY WITH MEALS 03/17/19   Elayne Snare, MD  mupirocin ointment (BACTROBAN) 2 % Sig to affected area twice a day x 7 days. 09/12/17   Horald Pollen, MD    Allergies  Allergen Reactions  . Codeine Other (See Comments)    REACTION: nausea    Patient Active Problem List   Diagnosis Date Noted  . 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 Hyperlipidemia 06/06/2014  . LBBB (left bundle branch block) 06/06/2014  . Morbid obesity ([place]) 03/17/2014  . Obstructive sleep apnea 02/22/2008    Past Medical History:  Diagnosis Date  . Allergic rhinitis   . Diabetes mellitus ([place]) 05/24/2014  . History of kidney stones 1990's  . Morbid obesity ([place]) 03/17/2014  . OBSTRUCTIVE SLEEP APNEA 02/22/2008   HST 2013:  AHI 51/hr.  Optimal pressure 12cm on autotitration.    . OSA on CPAP     Past Surgical History:  Procedure Laterality Date  . KNEE SURGERY     left    Social History   Socioeconomic History  . Marital status: Married  Spouse name: Not on file  . Number of children: Not on file  . Years of education: Not on file  . Highest education level: Not on file  Occupational History  . Occupation: Press photographer  Tobacco Use  . Smoking status: Never Smoker  . Smokeless tobacco: Never Used  Substance and Sexual Activity  . Alcohol use: No  . Drug use: No  . Sexual activity: Yes    Birth control/protection: None  Other Topics Concern  . Not on file  Social History Narrative  . Not on file   Social Determinants of Health   Financial Resource Strain:   . Difficulty of Paying Living Expenses: Not on file  Food Insecurity:   . Worried About Charity fundraiser in the Last Year: Not on file  . Ran  Out of Food in the Last Year: Not on file  Transportation Needs:   . Lack of Transportation (Medical): Not on file  . Lack of Transportation (Non-Medical): Not on file  Physical Activity:   . Days of Exercise per Week: Not on file  . Minutes of Exercise per Session: Not on file  Stress:   . Feeling of Stress : Not on file  Social Connections:   . Frequency of Communication with Friends and Family: Not on file  . Frequency of Social Gatherings with Friends and Family: Not on file  . Attends Religious Services: Not on file  . Active Member of Clubs or Organizations: Not on file  . Attends Archivist Meetings: Not on file  . Marital Status: Not on file  Intimate Partner Violence:   . Fear of Current or Ex-Partner: Not on file  . Emotionally Abused: Not on file  . Physically Abused: Not on file  . Sexually Abused: Not on file    Family History  Problem Relation Age of Onset  . Snoring Father   . Esophageal varices Father   . Diabetes Paternal Grandmother   . Heart disease Mother   . Gallstones Mother      Review of Systems  Constitutional: Negative.  Negative for chills and fever.  HENT: Negative.   Respiratory: Negative.  Negative for cough and shortness of breath.   Cardiovascular: Negative.  Negative for chest pain and palpitations.  Gastrointestinal: Negative.  Negative for abdominal pain, diarrhea, nausea and vomiting.  Genitourinary: Negative.  Negative for dysuria.  Musculoskeletal: Negative.  Negative for myalgias.  Skin: Positive for rash.  Neurological: Negative.  Negative for dizziness and headaches.  Endo/Heme/Allergies: Negative.   All other systems reviewed and are negative.   Vitals:   09/27/19 0927  BP: 114/73  Pulse: 66  Resp: 16  Temp: 98 F (36.7 C)  SpO2: 96%    Physical Exam Vitals reviewed.  Constitutional:      Appearance: Normal appearance. He is obese.  HENT:     Head: Normocephalic.  Eyes:     Extraocular Movements:  Extraocular movements intact.     Pupils: Pupils are equal, round, and reactive to light.  Cardiovascular:     Rate and Rhythm: Normal rate and regular rhythm.     Pulses: Normal pulses.     Heart sounds: Normal heart sounds.  Pulmonary:     Effort: Pulmonary effort is normal.     Breath sounds: Normal breath sounds.  Abdominal:     Palpations: Abdomen is soft.     Tenderness: There is no abdominal tenderness.  Musculoskeletal:        General:  Normal range of motion.     Cervical back: Normal range of motion.     Comments: Lower extremities: Mild pitting edema.  Good capillary refill.  Good peripheral circulation with strong peripheral pulses.  Skin:    General: Skin is warm and dry.     Findings: Rash present.     Comments: Left lower leg/ankle area: Small area of erythema with pitting edema.  Nontender.  No fluctuation or crepitation.  See picture below.  Neurological:     General: No focal deficit present.     Mental Status: He is alert and oriented to person, place, and time.  Psychiatric:        Mood and Affect: Mood normal.        Behavior: Behavior normal.      A total of 30 minutes was spent with the patient, greater than 50% of which was in counseling/coordination of care regarding chronic medical problems, review of most recent office visit notes, review of most recent blood work results, review of present medications, discussion of cellulitis in diabetics, need for antibiotic, diet and nutrition, Covid precautions, prognosis and need for follow-up if no better or worse during the next several days.   ASSESSMENT & PLAN: Rodney Turner was seen today for rash.  Diagnoses and all orders for this visit:  Cellulitis of left lower leg -     cefadroxil (DURICEF) 500 MG capsule; Take 1 capsule (500 mg total) by mouth 2 (two) times daily for 7 days.  Obstructive sleep apnea  Morbid obesity ([place])  Hyperlipidemia, unspecified hyperlipidemia type  Type 2 diabetes mellitus with  chronic kidney disease, without long-term current use of insulin, unspecified CKD stage ([place])  Dyslipidemia associated with type 2 diabetes mellitus ([place])    Patient Instructions       If you have lab work done today you will be contacted with your lab results within the next 2 weeks.  If you have not heard from [place] then please contact us. The fastest way to get your results is to register for My Chart.   IF you received an x-ray today, you will receive an invoice from [facility]. Please contact [facility] at [phone] with questions or concerns regarding your invoice.   IF you received labwork today, you will receive an invoice from [facility]. Please contact [facility] at [phone] with questions or concerns regarding your invoice.   Our billing staff will not be able to assist you with questions regarding bills from these companies.  You will be contacted with the lab results as soon as they are available. The fastest way to get your results is to activate your My Chart account. Instructions are located on the last page of this paperwork. If you have not heard from [place] regarding the results in 2 weeks, please contact this office.     Cellulitis, Adult  Cellulitis is a skin infection. The infected area is often warm, red, swollen, and sore. It occurs most often in the arms and lower legs. It is very important to get treated for this condition. What are the causes? This condition is caused by bacteria. The bacteria enter through a break in the skin, such as a cut, burn, insect bite, open sore, or crack. What increases the risk? This condition is more likely to occur in people who:  Have a weak body defense system (immune system).  Have open cuts, burns, bites, or scrapes on the skin.  Are older than 70 years of age.  Have a blood sugar problem (diabetes).  Have a long-lasting (chronic) liver disease (cirrhosis) or kidney disease.  Are very overweight  (obese).  Have a skin problem, such as: ? Itchy rash (eczema). ? Slow movement of blood in the veins (venous stasis). ? Fluid buildup below the skin (edema).  Have been treated with high-energy rays (radiation).  Use IV drugs. What are the signs or symptoms? Symptoms of this condition include:  Skin that is: ? Red. ? Streaking. ? Spotting. ? Swollen. ? Sore or painful when you touch it. ? Warm.  A fever.  Chills.  Blisters. How is this diagnosed? This condition is diagnosed based on:  Medical history.  Physical exam.  Blood tests.  Imaging tests. How is this treated? Treatment for this condition may include:  Medicines to treat infections or allergies.  Home care, such as: ? Rest. ? Placing cold or warm cloths (compresses) on the skin.  Hospital care, if the condition is very bad. Follow these instructions at home: Medicines  Take over-the-counter and prescription medicines only as told by your doctor.  If you were prescribed an antibiotic medicine, take it as told by your doctor. Do not stop taking it even if you start to feel better. General instructions   Drink enough fluid to keep your pee (urine) pale yellow.  Do not touch or rub the infected area.  Raise (elevate) the infected area above the level of your heart while you are sitting or lying down.  Place cold or warm cloths on the area as told by your doctor.  Keep all follow-up visits as told by your doctor. This is important. Contact a doctor if:  You have a fever.  You do not start to get better after 1-2 days of treatment.  Your bone or joint under the infected area starts to hurt after the skin has healed.  Your infection comes back. This can happen in the same area or another area.  You have a swollen bump in the area.  You have new symptoms.  You feel ill and have muscle aches and pains. Get help right away if:  Your symptoms get worse.  You feel very sleepy.  You throw  up (vomit) or have watery poop (diarrhea) for a long time.  You see red streaks coming from the area.  Your red area gets larger.  Your red area turns dark in color. These symptoms may represent a serious problem that is an emergency. Do not wait to see if the symptoms will go away. Get medical help right away. Call your local emergency services (911 in the [place] Do not drive yourself to the hospital. Summary  Cellulitis is a skin infection. The area is often warm, red, swollen, and sore.  This condition is treated with medicines, rest, and cold and warm cloths.  Take all medicines only as told by your doctor.  Tell your doctor if symptoms do not start to get better after 1-2 days of treatment. This information is not intended to replace advice given to you by your health care provider. Make sure you discuss any questions you have with your health care provider. Document Revised: 12/25/2017 Document Reviewed: 12/25/2017 Elsevier Patient Education  2020 Elsevier Inc.      Agustina Caroli, MD [facility]

## 2019-10-10 ENCOUNTER — Ambulatory Visit: Payer: Medicare Other | Attending: Internal Medicine

## 2019-10-10 ENCOUNTER — Ambulatory Visit: Payer: Medicare Other

## 2019-10-10 DIAGNOSIS — Z23 Encounter for immunization: Secondary | ICD-10-CM

## 2019-10-10 NOTE — Progress Notes (Signed)
   [id number]  Name:  Rodney Turner.    MRN: [id number] DOB: [birth year]  10/10/2019  Rodney Turner was observed post Covid-19 immunization for 15 minutes without incidence. He was provided with Vaccine Information Sheet and instruction to access the V-Safe system.   Rodney Turner was instructed to call 911 with any severe reactions post vaccine: Marland Kitchen Difficulty breathing  . Swelling of your face and throat  . A fast heartbeat  . A bad rash all over your body  . Dizziness and weakness    Immunizations Administered    Name Date Dose VIS Date Route   Pfizer COVID-19 Vaccine 10/10/2019  2:27 PM 0.3 mL 07/30/2019 Intramuscular   Manufacturer: [place]   Lot: [id number]   [place]: [id number]

## 2019-10-18 ENCOUNTER — Other Ambulatory Visit: Payer: Self-pay | Admitting: Emergency Medicine

## 2019-10-18 DIAGNOSIS — R21 Rash and other nonspecific skin eruption: Secondary | ICD-10-CM

## 2019-10-20 ENCOUNTER — Telehealth: Payer: Self-pay | Admitting: *Deleted

## 2019-10-20 NOTE — Telephone Encounter (Signed)
Schedule AWV.  

## 2019-10-25 ENCOUNTER — Ambulatory Visit (INDEPENDENT_AMBULATORY_CARE_PROVIDER_SITE_OTHER): Payer: Medicare Other | Admitting: Emergency Medicine

## 2019-10-25 VITALS — BP 114/73 | Ht 67.0 in | Wt 264.0 lb

## 2019-10-25 DIAGNOSIS — Z0000 Encounter for general adult medical examination without abnormal findings: Secondary | ICD-10-CM

## 2019-10-25 NOTE — Progress Notes (Signed)
Presents today for [employer] Visit   Date of last exam: 09/27/2019  Interpreter used for this visit? No  I connected with  Rodney Turner. on 10/25/19  enabled telephone and verified that I am speaking with the correct person using two identifiers.   I discussed the limitations of evaluation and management by telemedicine. The patient expressed understanding and agreed to proceed.    Patient Care Team: Horald Pollen, MD as PCP - General (Internal Medicine)   Other items to address today:   Discussed Eye/Dental Patient will be calling to make appointment Discussed immunizations Patient receiving 2nd [place] vaccine 3/16 Patient states seeing Endocrinologist 10-2019   Other Screening: Last screening for diabetes10/08/2017 Last lipid screening: 05/19/2017  ADVANCE DIRECTIVES: Discussed: YES On File: NO Materials Provided: YES  Immunization status:  Immunization History  Administered Date(s) Administered  . Influenza Split 06/23/2012  . Influenza Whole 05/20/2011  . Influenza, High Dose Seasonal PF 05/19/2018  . Influenza,inj,Quad PF,6+ Mos 07/07/2013, 05/24/2014  . Influenza-Unspecified 08/03/2019  . PFIZER SARS-COV-2 Vaccination 10/10/2019  . Tdap 01/18/2008     Health Maintenance Due  Topic Date Due  . URINE MICROALBUMIN  05/19/2018  . HEMOGLOBIN A1C  11/18/2018  . OPHTHALMOLOGY EXAM  01/16/2019     Functional Status Survey: Is the patient deaf or have difficulty hearing?: No Does the patient have difficulty seeing, even when wearing glasses/contacts?: No Does the patient have difficulty concentrating, remembering, or making decisions?: No Does the patient have difficulty walking or climbing stairs?: No Does the patient have difficulty dressing or bathing?: No Does the patient have difficulty doing errands alone such as visiting a doctor's office or shopping?: No   6CIT Screen 10/25/2019  What Year? 0 points  What month? 0  points  What time? 0 points  Count back from 20 0 points  Months in reverse 0 points  Repeat phrase 0 points  Total Score 0        Clinical Support from 10/25/2019 in [facility] [place]  AUDIT-C Score  0       Home Environment:   Lives one story home with wife No scattered rugs Yes grab bars in tub and shower Adequate lighting/no clutter No trouble climbing stairs  Time warm up N/A   Patient Active Problem List   Diagnosis Date Noted  . 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 Hyperlipidemia 06/06/2014  . LBBB (left bundle branch block) 06/06/2014  . Morbid obesity ([place]) 03/17/2014  . Obstructive sleep apnea 02/22/2008     Past Medical History:  Diagnosis Date  . Allergic rhinitis   . Diabetes mellitus ([place]) 05/24/2014  . History of kidney stones 1990's  . Morbid obesity ([place]) 03/17/2014  . OBSTRUCTIVE SLEEP APNEA 02/22/2008   HST 2013:  AHI 51/hr.  Optimal pressure 12cm on autotitration.    . OSA on CPAP      Past Surgical History:  Procedure Laterality Date  . KNEE SURGERY     left     Family History  Problem Relation Age of Onset  . Snoring Father   . Esophageal varices Father   . Diabetes Paternal Grandmother   . Heart disease Mother   . Gallstones Mother      Social History   Socioeconomic History  . Marital status: Married    Spouse name: Not on file  . Number of children: Not on file  . Years of education: Not  on file  . Highest education level: Not on file  Occupational History  . Occupation: Press photographer  Tobacco Use  . Smoking status: Never Smoker  . Smokeless tobacco: Never Used  Substance and Sexual Activity  . Alcohol use: No  . Drug use: No  . Sexual activity: Yes    Birth control/protection: None  Other Topics Concern  . Not on file  Social History Narrative  . Not on file   Social Determinants of Health   Financial Resource Strain:   . Difficulty of Paying Living Expenses: Not on file    Food Insecurity:   . Worried About Charity fundraiser in the Last Year: Not on file  . Ran Out of Food in the Last Year: Not on file  Transportation Needs:   . Lack of Transportation (Medical): Not on file  . Lack of Transportation (Non-Medical): Not on file  Physical Activity:   . Days of Exercise per Week: Not on file  . Minutes of Exercise per Session: Not on file  Stress:   . Feeling of Stress : Not on file  Social Connections:   . Frequency of Communication with Friends and Family: Not on file  . Frequency of Social Gatherings with Friends and Family: Not on file  . Attends Religious Services: Not on file  . Active Member of Clubs or Organizations: Not on file  . Attends Archivist Meetings: Not on file  . Marital Status: Not on file  Intimate Partner Violence:   . Fear of Current or Ex-Partner: Not on file  . Emotionally Abused: Not on file  . Physically Abused: Not on file  . Sexually Abused: Not on file     Allergies  Allergen Reactions  . Codeine Other (See Comments)    REACTION: nausea     Prior to Admission medications   Medication Sig Start Date End Date Taking? Authorizing Provider  aspirin 81 MG tablet Take 81 mg by mouth daily.   Yes [provider]  atorvastatin (LIPITOR) 10 MG tablet TAKE 1 TABLET (10 MG TOTAL) BY MOUTH DAILY AT 6 PM. 11/23/18  Yes Philemon Kingdom, MD  Blood Glucose Monitoring Suppl (ONE TOUCH ULTRA 2) w/Device KIT Use to check sugars daily. 06/03/16  Yes Philemon Kingdom, MD  glipiZIDE (GLUCOTROL XL) 5 MG 24 hr tablet TAKE 1 TABLET BY MOUTH ONCE DAILY BEFORE BREAKFAST AND 1/2 ONCE DAILY BEFORE SUPPER IF HAVING A LARGE MEAL. 09/27/19  Yes Philemon Kingdom, MD  glucose blood (ONE TOUCH ULTRA TEST) test strip Use as instructed check one time a day. 01/12/19  Yes Philemon Kingdom, MD  ibuprofen (ADVIL,MOTRIN) 100 MG tablet Take 100 mg by mouth every 6 (six) hours as needed for pain.    Yes [provider]  Lancets  (ONETOUCH ULTRASOFT) lancets Use as instructed check sugar one time daily. 04/03/17  Yes Philemon Kingdom, MD  metFORMIN (GLUCOPHAGE) 500 MG tablet TAKE 1 TABLET BY MOUTH TWICE A DAY WITH MEALS 03/17/19  Yes Elayne Snare, MD  triamcinolone cream (KENALOG) 0.1 % APPLY CREAM EXTERNALLY TWICE DAILY FOR 7 DAYS 10/20/19  Yes Sagardia, Ines Bloomer, MD  fluticasone [facility]) 50 MCG/ACT nasal spray Place 1 spray into both nostrils daily.    [provider]  guaiFENesin (MUCINEX) 600 MG 12 hr tablet Take 1,200 mg by mouth 2 (two) times daily as needed for cough or to loosen phlegm.     [provider]     Depression screen [facility] 2/9 10/25/2019  09/27/2019 09/19/2018 09/12/2017 11/25/2016  Decreased Interest 0 0 0 0 0  Down, Depressed, Hopeless 0 0 0 0 0  PHQ - 2 Score 0 0 0 0 0     Fall Risk  10/25/2019 09/27/2019 09/19/2018 09/12/2017 11/25/2016  Falls in the past year? 0 0 0 Yes No  Number falls in past yr: 0 - - 2 or more -  Injury with Fall? 0 - - Yes -  Comment - - - right shoulder and right elbow -  Follow up Falls evaluation completed;Education provided Falls evaluation completed - - -      PHYSICAL EXAM: BP 114/73 Comment: taken from a previous visit  Ht 5' 7"  (1.702 m)   Wt 264 lb (119.7 kg)   BMI 41.35 kg/m    Wt Readings from Last 3 Encounters:  10/25/19 264 lb (119.7 kg)  09/27/19 264 lb (119.7 kg)  09/19/18 265 lb 6.4 oz (120.4 kg)       Education/Counseling provided regarding diet and exercise, prevention of chronic diseases, smoking/tobacco cessation, if applicable, and reviewed "Covered Medicare Preventive Services."

## 2019-10-25 NOTE — Patient Instructions (Signed)
Thank you for taking time to come for your Medicare Wellness Visit. I appreciate your ongoing commitment to your health goals. Please review the following plan we discussed and let me know if I can assist you in the future.  Leroy Kennedy LPN  Preventive Care 70 Years and Older, Male Preventive care refers to lifestyle choices and visits with your health care provider that can promote health and wellness. This includes:  A yearly physical exam. This is also called an annual well check.  Regular dental and eye exams.  Immunizations.  Screening for certain conditions.  Healthy lifestyle choices, such as diet and exercise. What can I expect for my preventive care visit? Physical exam Your health care provider will check:  Height and weight. These may be used to calculate body mass index (BMI), which is a measurement that tells if you are at a healthy weight.  Heart rate and blood pressure.  Your skin for abnormal spots. Counseling Your health care provider may ask you questions about:  Alcohol, tobacco, and drug use.  Emotional well-being.  Home and relationship well-being.  Sexual activity.  Eating habits.  History of falls.  Memory and ability to understand (cognition).  Work and work Statistician. What immunizations do I need?  Influenza (flu) vaccine  This is recommended every year. Tetanus, diphtheria, and pertussis (Tdap) vaccine  You may need a Td booster every 10 years. Varicella (chickenpox) vaccine  You may need this vaccine if you have not already been vaccinated. Zoster (shingles) vaccine  You may need this after age 66. Pneumococcal conjugate (PCV13) vaccine  One dose is recommended after age 84. Pneumococcal polysaccharide (PPSV23) vaccine  One dose is recommended after age 88. Measles, mumps, and rubella (MMR) vaccine  You may need at least one dose of MMR if you were born in 1957 or later. You may also need a second dose. Meningococcal  conjugate (MenACWY) vaccine  You may need this if you have certain conditions. Hepatitis A vaccine  You may need this if you have certain conditions or if you travel or work in places where you may be exposed to hepatitis A. Hepatitis B vaccine  You may need this if you have certain conditions or if you travel or work in places where you may be exposed to hepatitis B. Haemophilus influenzae type b (Hib) vaccine  You may need this if you have certain conditions. You may receive vaccines as individual doses or as more than one vaccine together in one shot (combination vaccines). Talk with your health care provider about the risks and benefits of combination vaccines. What tests do I need? Blood tests  Lipid and cholesterol levels. These may be checked every 5 years, or more frequently depending on your overall health.  Hepatitis C test.  Hepatitis B test. Screening  Lung cancer screening. You may have this screening every year starting at age 24 if you have a 30-pack-year history of smoking and currently smoke or have quit within the past 15 years.  Colorectal cancer screening. All adults should have this screening starting at age 52 and continuing until age 61. Your health care provider may recommend screening at age 57 if you are at increased risk. You will have tests every 1-10 years, depending on your results and the type of screening test.  Prostate cancer screening. Recommendations will vary depending on your family history and other risks.  Diabetes screening. This is done by checking your blood sugar (glucose) after you have not eaten for  a while (fasting). You may have this done every 1-3 years.  Abdominal aortic aneurysm (AAA) screening. You may need this if you are a current or former smoker.  Sexually transmitted disease (STD) testing. Follow these instructions at home: Eating and drinking  Eat a diet that includes fresh fruits and vegetables, whole grains, lean  protein, and low-fat dairy products. Limit your intake of foods with high amounts of sugar, saturated fats, and salt.  Take vitamin and mineral supplements as recommended by your health care provider.  Do not drink alcohol if your health care provider tells you not to drink.  If you drink alcohol: ? Limit how much you have to 0-2 drinks a day. ? Be aware of how much alcohol is in your drink. In the [place] one drink equals one 12 oz bottle of beer (355 mL), one 5 oz glass of wine (148 mL), or one 1 oz glass of hard liquor (44 mL). Lifestyle  Take daily care of your teeth and gums.  Stay active. Exercise for at least 30 minutes on 5 or more days each week.  Do not use any products that contain nicotine or tobacco, such as cigarettes, e-cigarettes, and chewing tobacco. If you need help quitting, ask your health care provider.  If you are sexually active, practice safe sex. Use a condom or other form of protection to prevent STIs (sexually transmitted infections).  Talk with your health care provider about taking a low-dose aspirin or statin. What's next?  Visit your health care provider once a year for a well check visit.  Ask your health care provider how often you should have your eyes and teeth checked.  Stay up to date on all vaccines. This information is not intended to replace advice given to you by your health care provider. Make sure you discuss any questions you have with your health care provider. Document Revised: 07/30/2018 Document Reviewed: 07/30/2018 Elsevier Patient Education  2020 Elsevier Inc.  

## 2019-10-31 DIAGNOSIS — U071 COVID-19: Secondary | ICD-10-CM | POA: Diagnosis not present

## 2019-10-31 DIAGNOSIS — Z20828 Contact with and (suspected) exposure to other viral communicable diseases: Secondary | ICD-10-CM | POA: Diagnosis not present

## 2019-11-01 ENCOUNTER — Ambulatory Visit: Payer: Self-pay

## 2019-11-01 NOTE — Telephone Encounter (Signed)
Patient would like to speak with nurse. Patient has had first dose of vaccine and just tested positive for covid. Patient wants to know what to do about second dose    Called pt and advised him to call back and schedule his second dose once he is finished with his quarantine. Pt's appt canceled and also canceled his wife appt because she is sick with Covid as well.  Pt verbalized understanding.  Reason for Disposition . General information question, no triage required and triager able to answer question  Answer Assessment - Initial Assessment Questions 1. REASON FOR CALL or QUESTION: "What is your reason for calling today?" or "How can I best help you?" or "What question do you have that I can help answer?"     Patient would like to speak with nurse. Patient has had first dose of vaccine and just tested positive for covid. Patient wants to know what to do about second dose.  Protocols used: INFORMATION ONLY CALL - NO TRIAGE-A-AH

## 2019-11-02 ENCOUNTER — Ambulatory Visit: Payer: Medicare Other

## 2019-11-05 ENCOUNTER — Encounter: Payer: Self-pay | Admitting: Registered Nurse

## 2019-11-05 ENCOUNTER — Other Ambulatory Visit: Payer: Self-pay

## 2019-11-05 ENCOUNTER — Telehealth (INDEPENDENT_AMBULATORY_CARE_PROVIDER_SITE_OTHER): Payer: Medicare Other | Admitting: Registered Nurse

## 2019-11-05 DIAGNOSIS — R05 Cough: Secondary | ICD-10-CM | POA: Diagnosis not present

## 2019-11-05 DIAGNOSIS — R059 Cough, unspecified: Secondary | ICD-10-CM

## 2019-11-05 DIAGNOSIS — R062 Wheezing: Secondary | ICD-10-CM

## 2019-11-05 MED ORDER — PREDNISONE 20 MG PO TABS: 20.0000 mg | ORAL_TABLET | Freq: Every day | ORAL | 0 refills | Status: DC

## 2019-11-05 MED ORDER — BENZONATATE 100 MG PO CAPS: 100.0000 mg | ORAL_CAPSULE | Freq: Two times a day (BID) | ORAL | 2 refills | Status: DC | PRN

## 2019-11-05 MED ORDER — ALBUTEROL SULFATE HFA 108 (90 BASE) MCG/ACT IN AERS: 2.0000 | INHALATION_SPRAY | Freq: Four times a day (QID) | RESPIRATORY_TRACT | 2 refills | Status: DC | PRN

## 2019-11-05 NOTE — Progress Notes (Signed)
Telemedicine Encounter- SOAP NOTE Established Patient  This telephone encounter was conducted with the patient's (or proxy's) verbal consent via audio telecommunications: yes  Patient was instructed to have this encounter in a suitably private space; and to only have persons present to whom they give permission to participate. In addition, patient identity was confirmed by use of name plus two identifiers (DOB and address).  I discussed the limitations, risks, security and privacy concerns of performing an evaluation and management service by telephone and the availability of in person appointments. I also discussed with the patient that there may be a patient responsible charge related to this service. The patient expressed understanding and agreed to proceed.  I spent a total of 11 minutes talking with the patient or their proxy.  Chief Complaint  Patient presents with  . Shortness of Breath    patient states he is getting over covid well all except the shortness of breathe , like when he takes a deep breath in it makes him cough bad. 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 is a 69 y.o. established patient. Telephone visit today for shortness of breath  HPI COVID-19 dx - feeling much better  Still having some shob and coughing with deep breathing.  Nonproductive.  No lightheadedness, dizziness, loc, chest pain. Been doing trumpet breathing exercises which has somewhat helped  No further concerns. No fevers, nvd, headaches, loss of taste or smell.  Patient Active Problem List   Diagnosis Date Noted  . 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 Hyperlipidemia 06/06/2014  . LBBB (left bundle branch block) 06/06/2014  . Morbid obesity ([place]) 03/17/2014  . Obstructive  sleep apnea 02/22/2008    Past Medical History:  Diagnosis Date  . Allergic rhinitis   . Diabetes mellitus ([place]) 05/24/2014  . History of kidney stones 1990's  . Morbid obesity ([place]) 03/17/2014  . OBSTRUCTIVE SLEEP APNEA 02/22/2008   HST 2013:  AHI 51/hr.  Optimal pressure 12cm on autotitration.    . OSA on CPAP     Current Outpatient Medications  Medication Sig Dispense Refill  . aspirin 81 MG tablet Take 81 mg by mouth daily.    Marland Kitchen atorvastatin (LIPITOR) 10 MG tablet TAKE 1 TABLET (10 MG TOTAL) BY MOUTH DAILY AT 6 PM. 90 tablet 3  . Blood Glucose Monitoring Suppl (ONE TOUCH ULTRA 2) w/Device KIT Use to check sugars daily. 1 each 0  . fluticasone (FLONASE) 50 MCG/ACT nasal spray Place 1 spray into both nostrils daily.    Marland Kitchen glipiZIDE (GLUCOTROL XL) 5 MG 24 hr tablet TAKE 1 TABLET BY MOUTH ONCE DAILY BEFORE BREAKFAST AND 1/2 ONCE DAILY BEFORE SUPPER IF HAVING A LARGE MEAL. 90 tablet 0  . glucose blood (ONE TOUCH ULTRA TEST) test strip Use as instructed check one time a day. 100 each 11  . guaiFENesin (MUCINEX) 600 MG 12 hr tablet Take 1,200 mg by mouth 2 (two) times daily as needed for cough or to loosen phlegm.     Marland Kitchen ibuprofen (ADVIL,MOTRIN) 100 MG tablet Take 100 mg by mouth every 6 (six) hours as needed for pain.     . Lancets (ONETOUCH ULTRASOFT) lancets Use as instructed check sugar one  time daily. 100 each 12  . metFORMIN (GLUCOPHAGE) 500 MG tablet TAKE 1 TABLET BY MOUTH TWICE A DAY WITH MEALS 180 tablet 3  . triamcinolone cream (KENALOG) 0.1 % APPLY CREAM EXTERNALLY TWICE DAILY FOR 7 DAYS 45 g 0  . albuterol (VENTOLIN HFA) 108 (90 Base) MCG/ACT inhaler Inhale 2 puffs into the lungs every 6 (six) hours as needed for wheezing or shortness of breath. 18 g 2  . benzonatate (TESSALON) 100 MG capsule Take 1 capsule (100 mg total) by mouth 2 (two) times daily as needed for cough. 30 capsule 2  . predniSONE (DELTASONE) 20 MG tablet Take 1 tablet (20 mg total) by mouth daily with breakfast. 3  tablet 0   No current facility-administered medications for this visit.    Allergies  Allergen Reactions  . Codeine Other (See Comments)    REACTION: nausea    Social History   Socioeconomic History  . Marital status: Married    Spouse name: Not on file  . Number of children: Not on file  . Years of education: Not on file  . Highest education level: Not on file  Occupational History  . Occupation: Press photographer  Tobacco Use  . Smoking status: Never Smoker  . Smokeless tobacco: Never Used  Substance and Sexual Activity  . Alcohol use: No  . Drug use: No  . Sexual activity: Yes    Birth control/protection: None  Other Topics Concern  . Not on file  Social History Narrative  . Not on file   Social Determinants of Health   Financial Resource Strain:   . Difficulty of Paying Living Expenses:   Food Insecurity:   . Worried About Charity fundraiser in the Last Year:   . Arboriculturist in the Last Year:   Transportation Needs:   . Film/video editor (Medical):   Marland Kitchen Lack of Transportation (Non-Medical):   Physical Activity:   . Days of Exercise per Week:   . Minutes of Exercise per Session:   Stress:   . Feeling of Stress :   Social Connections:   . Frequency of Communication with Friends and Family:   . Frequency of Social Gatherings with Friends and Family:   . Attends Religious Services:   . Active Member of Clubs or Organizations:   . Attends Archivist Meetings:   Marland Kitchen Marital Status:   Intimate Partner Violence:   . Fear of Current or Ex-Partner:   . Emotionally Abused:   Marland Kitchen Physically Abused:   . Sexually Abused:     Review of Systems  Constitutional: Negative.   HENT: Negative.   Eyes: Negative.   Respiratory: Negative.   Cardiovascular: Negative.   Gastrointestinal: Negative.   Genitourinary: Negative.   Musculoskeletal: Negative.   Skin: Negative.   Neurological: Negative.   Endo/Heme/Allergies: Negative.   Psychiatric/Behavioral:  Negative.   All other systems reviewed and are negative.   Objective   Vitals as reported by the patient: There were no vitals filed for this visit.  Sherif was seen today for shortness of breath.  Diagnoses and all orders for this visit:  Wheezing -     benzonatate (TESSALON) 100 MG capsule; Take 1 capsule (100 mg total) by mouth 2 (two) times daily as needed for cough. -     predniSONE (DELTASONE) 20 MG tablet; Take 1 tablet (20 mg total) by mouth daily with breakfast. -     albuterol (VENTOLIN HFA) 108 (90 Base) MCG/ACT inhaler; Inhale  2 puffs into the lungs every 6 (six) hours as needed for wheezing or shortness of breath.  Coughing -     benzonatate (TESSALON) 100 MG capsule; Take 1 capsule (100 mg total) by mouth 2 (two) times daily as needed for cough. -     predniSONE (DELTASONE) 20 MG tablet; Take 1 tablet (20 mg total) by mouth daily with breakfast. -     albuterol (VENTOLIN HFA) 108 (90 Base) MCG/ACT inhaler; Inhale 2 puffs into the lungs every 6 (six) hours as needed for wheezing or shortness of breath.   PLAN  Tessalon   Prednisone 65m Po qd for three days - monitor sugars and bp  Albuterol prn for shob  Return precautions given  Patient encouraged to call clinic with any questions, comments, or concerns.   I discussed the assessment and treatment plan with the patient. The patient was provided an opportunity to ask questions and all were answered. The patient agreed with the plan and demonstrated an understanding of the instructions.   The patient was advised to call back or seek an in-person evaluation if the symptoms worsen or if the condition fails to improve as anticipated.  I provided 11 minutes of non-face-to-face time during this encounter.  RMaximiano Coss NP  [facility]

## 2019-11-05 NOTE — Patient Instructions (Signed)
° ° ° °  If you have lab work done today you will be contacted with your lab results within the next 2 weeks.  If you have not heard from us then please contact us. The fastest way to get your results is to register for My Chart. ° ° °IF you received an x-ray today, you will receive an invoice from [facility]. Please contact [facility] at [phone] with questions or concerns regarding your invoice.  ° °IF you received labwork today, you will receive an invoice from [facility]. Please contact [facility] at [phone] with questions or concerns regarding your invoice.  ° °Our billing staff will not be able to assist you with questions regarding bills from these companies. ° °You will be contacted with the lab results as soon as they are available. The fastest way to get your results is to activate your My Chart account. Instructions are located on the last page of this paperwork. If you have not heard from us regarding the results in 2 weeks, please contact this office. °  ° ° ° °

## 2019-11-12 ENCOUNTER — Ambulatory Visit: Payer: Medicare Other | Admitting: Internal Medicine

## 2019-11-15 ENCOUNTER — Other Ambulatory Visit: Payer: Self-pay | Admitting: Internal Medicine

## 2019-12-04 ENCOUNTER — Other Ambulatory Visit: Payer: Self-pay | Admitting: Internal Medicine

## 2019-12-04 DIAGNOSIS — E119 Type 2 diabetes mellitus without complications: Secondary | ICD-10-CM

## 2019-12-15 ENCOUNTER — Ambulatory Visit: Payer: Medicare Other

## 2019-12-21 ENCOUNTER — Ambulatory Visit: Payer: Medicare Other | Attending: Internal Medicine

## 2019-12-21 DIAGNOSIS — Z23 Encounter for immunization: Secondary | ICD-10-CM

## 2019-12-21 NOTE — Progress Notes (Signed)
   [id number]  Name:  Rodney Turner.    MRN: [id number] DOB: 10-14-1950  12/21/2019  [name] was observed post Covid-19 immunization for 15 minutes without incident. He was provided with Vaccine Information Sheet and instruction to access the V-Safe system.   [name] was instructed to call 911 with any severe reactions post vaccine: Marland Kitchen Difficulty breathing  . Swelling of face and throat  . A fast heartbeat  . A bad rash all over body  . Dizziness and weakness   Immunizations Administered    Name Date Dose VIS Date Route   Pfizer COVID-19 Vaccine 12/21/2019  1:14 PM 0.3 mL 10/13/2018 Intramuscular   Manufacturer: [place]   Lot: [id number]   [place]: [id number]      Covid-19 Vaccination Clinic  Name:  Rodney Turner.    MRN: [id number] DOB: 1951/05/19  12/21/2019  [name] was observed post Covid-19 immunization for 15 minutes without incident. He was provided with Vaccine Information Sheet and instruction to access the V-Safe system.   [name] was instructed to call 911 with any severe reactions post vaccine: Marland Kitchen Difficulty breathing  . Swelling of face and throat  . A fast heartbeat  . A bad rash all over body  . Dizziness and weakness   Immunizations Administered    Name Date Dose VIS Date Route   Pfizer COVID-19 Vaccine 12/21/2019  1:14 PM 0.3 mL 10/13/2018 Intramuscular   Manufacturer: [place]   Lot: [id number]   [place]: [id number]

## 2019-12-31 ENCOUNTER — Other Ambulatory Visit: Payer: Self-pay

## 2019-12-31 ENCOUNTER — Ambulatory Visit (INDEPENDENT_AMBULATORY_CARE_PROVIDER_SITE_OTHER): Payer: Medicare Other | Admitting: Internal Medicine

## 2019-12-31 ENCOUNTER — Encounter: Payer: Self-pay | Admitting: Internal Medicine

## 2019-12-31 VITALS — BP 138/80 | HR 76 | Ht 67.0 in | Wt 255.0 lb

## 2019-12-31 DIAGNOSIS — E1122 Type 2 diabetes mellitus with diabetic chronic kidney disease: Secondary | ICD-10-CM | POA: Diagnosis not present

## 2019-12-31 DIAGNOSIS — E785 Hyperlipidemia, unspecified: Secondary | ICD-10-CM

## 2019-12-31 LAB — COMPREHENSIVE METABOLIC PANEL
ALT: 23 U/L (ref 0–53)
AST: 23 U/L (ref 0–37)
Albumin: 4.3 g/dL (ref 3.5–5.2)
Alkaline Phosphatase: 53 U/L (ref 39–117)
BUN: 17 mg/dL (ref 6–23)
CO2: 28 mEq/L (ref 19–32)
Calcium: 9.5 mg/dL (ref 8.4–10.5)
Chloride: 103 mEq/L (ref 96–112)
Creatinine, Ser: 0.98 mg/dL (ref 0.40–1.50)
GFR: 75.72 mL/min (ref >60.00)
Glucose, Bld: 152 mg/dL — ABNORMAL HIGH (ref 70–99)
Potassium: 4.7 mEq/L (ref 3.5–5.1)
Sodium: 138 mEq/L (ref 135–145)
Total Bilirubin: 0.6 mg/dL (ref 0.2–1.2)
Total Protein: 7.5 g/dL (ref 6.0–8.3)

## 2019-12-31 LAB — POCT GLYCOSYLATED HEMOGLOBIN (HGB A1C): Hemoglobin A1C: 7.3 % — AB (ref 4.0–5.6)

## 2019-12-31 LAB — LIPID PANEL
Cholesterol: 103 mg/dL (ref 0–200)
HDL: 29.4 mg/dL — ABNORMAL LOW (ref >39.00)
LDL Cholesterol: 43 mg/dL (ref 0–99)
NonHDL: 73.62
Total CHOL/HDL Ratio: 4
Triglycerides: 152 mg/dL — ABNORMAL HIGH (ref 0.0–149.0)
VLDL: 30.4 mg/dL (ref 0.0–40.0)

## 2019-12-31 LAB — MICROALBUMIN / CREATININE URINE RATIO
Creatinine,U: 92.1 mg/dL
Microalb Creat Ratio: 0.8 mg/g (ref 0.0–30.0)
Microalb, Ur: <0.7 mg/dL (ref 0.0–1.9)

## 2019-12-31 MED ORDER — FARXIGA 10 MG PO TABS: 10.0000 mg | ORAL_TABLET | Freq: Every day | ORAL | 11 refills | Status: DC

## 2019-12-31 NOTE — Progress Notes (Addendum)
Patient ID: Rodney Turner., male   DOB: 1950/10/04, 69 y.o.   MRN: [id number]   This visit occurred during the SARS-CoV-2 public health emergency.  Safety protocols were in place, including screening questions prior to the visit, additional usage of staff PPE, and extensive cleaning of exam room while observing appropriate contact time as indicated for disinfecting solutions.   HPI: Rodney Turner. is a 69 y.o.-year-old male, initially referred by his PCP, Dr. Everlene Farrier, presenting for f/u for DM2, dx in 03/2016, non-insulin-dependent, uncontrolled, with complications (mild CKD). His wife, Rodney Turner, is also my pt. Last visit was several months ago (virtual).  Reviewed history: In 01/2016, he started to feel very fatigued, frequent urination, nocturia, weight loss - saw PCP >> HbA1c very high (>14%). Prev. HbA1c levels were in the 6-7% range.  Last hemoglobin A1c: Lab Results  Component Value Date   HGBA1C 5.9 (A) 05/19/2018   HGBA1C 5.9 11/17/2017   HGBA1C 5.5 05/19/2017   Pt is on a regimen of: - Metformin 1000 mg at dinnertime - Glipizide ER 5 mg before b'fast  - Glipizide ER 5 mg before dinner (2 halves of a tablet) Since last visit, he contacted me in 07/2019 with high blood sugars.  I suggested Ozempic.  He did not start this.  Pt checks his sugars 1-2 times a day: - am:  120-152, 159 >> 150-180 >> 140-175, 200 >> 166-202 - 2h after b'fast: n/c - before lunch: 100-131 >> 115-137, 171 >> <150 >> n/c - 2h after lunch: 230 (double cheeseburger and fries) >> n/c  - before dinner:  88-121 >> 100-110 >> 110-120, 140 >> 102, 105, 124-151, 171 - 2h after dinner: n/c >> 120-159 >> n/c - bedtime: n/c >> 149 >> <180 >> 175, 180 >> n/c - nighttime: n/c Lowest sugar was 88 >> 98 >> 103; he has hypoglycemia awareness in the 70s. Highest sugar was 171 >> 205 >> 200s.   Pt's meals are: - Breakfast: McDonalds >> burrito - Lunch: grilled chicken sandwich - Dinner: Chick-fil-a or  [employer] salad or meat + veggies + starch - Snacks: 1: chips, popcorn, fruit cups, fresh fruit  -+ Mild CKD. Last BUN/creatinine:  Lab Results  Component Value Date   BUN 14 05/19/2017   BUN 13 04/16/2016   CREATININE 1.13 05/19/2017   CREATININE 1.05 04/16/2016   -+ HL; last set of lipids: Lab Results  Component Value Date   CHOL 92 05/19/2017   HDL 32.60 (L) 05/19/2017   LDLCALC 24 05/19/2017   TRIG 179.0 (H) 05/19/2017   CHOLHDL 3 05/19/2017  On Lipitor 10 - last eye exam was in 01/2018: No DR. Dr. Sabra Heck. -No numbness and tingling in his feet. On ASA 81.  He also has OSA.  He retired 05/2017.  ROS: Constitutional: no weight gain/no weight loss, no fatigue, no subjective hyperthermia, no subjective hypothermia Eyes: no blurry vision, no xerophthalmia ENT: no sore throat, no nodules palpated in neck, no dysphagia, no odynophagia, no hoarseness Cardiovascular: no CP/no SOB/no palpitations/no leg swelling Respiratory: no cough/no SOB/no wheezing Gastrointestinal: no N/no V/no D/no C/no acid reflux Musculoskeletal: no muscle aches/no joint aches Skin: no rashes, no hair loss Neurological: no tremors/no numbness/no tingling/no dizziness  I reviewed pt's medications, allergies, PMH, social hx, family hx, and changes were documented in the history of present illness. Otherwise, unchanged from my initial visit note.   Past Medical History:  Diagnosis Date  . Allergic rhinitis   . Diabetes mellitus (  [place]) 05/24/2014  . History of kidney stones 1990's  . Morbid obesity ([place]) 03/17/2014  . OBSTRUCTIVE SLEEP APNEA 02/22/2008   HST 2013:  AHI 51/hr.  Optimal pressure 12cm on autotitration.    . OSA on CPAP    Past Surgical History:  Procedure Laterality Date  . KNEE SURGERY     left   Social History   Social History  . Marital status: Married    Spouse name: N/A  . Number of children: 2   Occupational History  . sales    Social History Main Topics  . Smoking  status: Never Smoker  . Smokeless tobacco: Never Used  . Alcohol use No  . Drug use: No   Current Outpatient Medications on File Prior to Visit  Medication Sig Dispense Refill  . albuterol (VENTOLIN HFA) 108 (90 Base) MCG/ACT inhaler Inhale 2 puffs into the lungs every 6 (six) hours as needed for wheezing or shortness of breath. 18 g 2  . aspirin 81 MG tablet Take 81 mg by mouth daily.    Rodney Kitchen atorvastatin (LIPITOR) 10 MG tablet TAKE 1 TABLET BY MOUTH ONCE DAILY AT  6  PM 90 tablet 0  . benzonatate (TESSALON) 100 MG capsule Take 1 capsule (100 mg total) by mouth 2 (two) times daily as needed for cough. 30 capsule 2  . Blood Glucose Monitoring Suppl (ONE TOUCH ULTRA 2) w/Device KIT Use to check sugars daily. 1 each 0  . fluticasone (FLONASE) 50 MCG/ACT nasal spray Place 1 spray into both nostrils daily.    Rodney Kitchen glipiZIDE (GLUCOTROL XL) 5 MG 24 hr tablet TAKE ONE TABLET BY MOUTH ONCE DAILY BEFORE BREAKFAST AND 1/2 ONCE DAILY BEFORE SUPPER IF HAVING A LARGE MEAL. 90 tablet 1  . glucose blood (ONE TOUCH ULTRA TEST) test strip Use as instructed check one time a day. 100 each 11  . guaiFENesin (MUCINEX) 600 MG 12 hr tablet Take 1,200 mg by mouth 2 (two) times daily as needed for cough or to loosen phlegm.     Rodney Kitchen ibuprofen (ADVIL,MOTRIN) 100 MG tablet Take 100 mg by mouth every 6 (six) hours as needed for pain.     . Lancets (ONETOUCH ULTRASOFT) lancets Use as instructed check sugar one time daily. 100 each 12  . metFORMIN (GLUCOPHAGE) 500 MG tablet TAKE 1 TABLET BY MOUTH TWICE A DAY WITH MEALS 180 tablet 3  . predniSONE (DELTASONE) 20 MG tablet Take 1 tablet (20 mg total) by mouth daily with breakfast. 3 tablet 0  . triamcinolone cream (KENALOG) 0.1 % APPLY CREAM EXTERNALLY TWICE DAILY FOR 7 DAYS 45 g 0   No current facility-administered medications on file prior to visit.   Allergies  Allergen Reactions  . Codeine Other (See Comments)    REACTION: nausea   Family History  Problem Relation Age of  Onset  . Snoring Father   . Esophageal varices Father   . Diabetes Paternal Grandmother   . Heart disease Mother   . Gallstones Mother    PE: BP 138/80   Pulse 76   Ht 5' 7"  (1.702 m)   Wt 255 lb (115.7 kg)   SpO2 95%   BMI 39.94 kg/m  Wt Readings from Last 3 Encounters:  12/31/19 255 lb (115.7 kg)  10/25/19 264 lb (119.7 kg)  09/27/19 264 lb (119.7 kg)   Constitutional: overweight, in NAD Eyes: PERRLA, EOMI, no exophthalmos ENT: moist mucous membranes, no thyromegaly, no cervical lymphadenopathy Cardiovascular: RRR, No MRG Respiratory: CTA B  Gastrointestinal: abdomen soft, NT, ND, BS+ Musculoskeletal: no deformities, strength intact in all 4 Skin: moist, warm, no rashes Neurological: no tremor with outstretched hands, DTR normal in all 4  ASSESSMENT: 1. DM2, non-insulin-dependent, uncontrolled, with complications -Mild CKD  2. HL  3. Obesity class 3 BMI Classification:  < 18.5 underweight   18.5-24.9 normal weight   25.0-29.9 overweight   30.0-34.9 class I obesity   35.0-39.9 class II obesity   ? 40.0 class III obesity   PLAN:  1. Patient with previously fairly well-controlled type 2 diabetes, on Metformin and glipizide only, with higher blood sugars during the coronavirus pandemic.  At last visit we did discuss about staying active and restart walking and also diet by cutting out snacks.  He contacted me after our last visit with high blood sugars and I advised him to start Ozempic.  He did not start this yet. -At this visit, sugars are higher, especially in the morning.  They are also above goal later in the day, when he checks before dinner.  We discussed about the importance of staying active and also limiting snacks and reducing portions and overall improving diet.  He would not want to add an injectable medicine for now.  I suggested to try [place] and discussed about benefits and possible side effects.  I advised him to stay well-hydrated while on this  medication. - I suggested to:  Patient Instructions  Please continue: - Metformin 1000 mg at dinnertime - Glipizide ER 5 mg before b'fast  - Glipizide ER 5 mg before dinner (2 halves of a tablet)  Please add: - Farxiga 10 mg before b'fast  Please return in 3-4 months with your sugar log.  - we checked his HbA1c: 7.3% (higher) - advised to check sugars at different times of the day - 1-2x a day, rotating check times - advised for yearly eye exams >> he is not UTD - will check annual labs today - return to clinic in 3-4 months    2. HL -Reviewed latest lipid panel from 05/2017: LDL at goal, triglycerides high, HDL low Lab Results  Component Value Date   CHOL 92 05/19/2017   HDL 32.60 (L) 05/19/2017   LDLCALC 24 05/19/2017   TRIG 179.0 (H) 05/19/2017   CHOLHDL 3 05/19/2017  -Continues Lipitor without side effects. -Needs a  new lipid panel >> we will check today  3.  Obesity class 3 -Weight approximately stable since last visit until 10/2019, since then he lost ~9 lbs -we again discussed about the importance of backing out snacks advisable to restart exercise - recommended a stationary bike and going to the gym - will add a SGLT2 inh. Which should also help with wt loss  Office Visit on 12/31/2019  Component Date Value Ref Range Status  . Sodium 12/31/2019 138  135 - 145 mEq/L Final  . Potassium 12/31/2019 4.7  3.5 - 5.1 mEq/L Final  . Chloride 12/31/2019 103  96 - 112 mEq/L Final  . CO2 12/31/2019 28  19 - 32 mEq/L Final  . Glucose, Bld 12/31/2019 152* 70 - 99 mg/dL Final  . BUN 12/31/2019 17  6 - 23 mg/dL Final  . Creatinine, Ser 12/31/2019 0.98  0.40 - 1.50 mg/dL Final  . Total Bilirubin 12/31/2019 0.6  0.2 - 1.2 mg/dL Final  . Alkaline Phosphatase 12/31/2019 53  39 - 117 U/L Final  . AST 12/31/2019 23  0 - 37 U/L Final  . ALT 12/31/2019 23  0 -  53 U/L Final  . Total Protein 12/31/2019 7.5  6.0 - 8.3 g/dL Final  . Albumin 12/31/2019 4.3  3.5 - 5.2 g/dL Final  . GFR  12/31/2019 75.72  >60.00 mL/min Final  . Calcium 12/31/2019 9.5  8.4 - 10.5 mg/dL Final  . Microalb, Ur 12/31/2019 <0.7  0.0 - 1.9 mg/dL Final  . Creatinine,U 12/31/2019 92.1  mg/dL Final  . Microalb Creat Ratio 12/31/2019 0.8  0.0 - 30.0 mg/g Final  . Cholesterol 12/31/2019 103  0 - 200 mg/dL Final   ATP III Classification       Desirable:  < 200 mg/dL               Borderline High:  200 - 239 mg/dL          High:  > = 240 mg/dL  . Triglycerides 12/31/2019 152.0* 0.0 - 149.0 mg/dL Final   Normal:  <150 mg/dLBorderline High:  150 - 199 mg/dL  . HDL 12/31/2019 29.40* >39.00 mg/dL Final  . VLDL 12/31/2019 30.4  0.0 - 40.0 mg/dL Final  . LDL Cholesterol 12/31/2019 43  0 - 99 mg/dL Final  . Total CHOL/HDL Ratio 12/31/2019 4   Final                  Men          Women1/2 Average Risk     3.4          3.3Average Risk          5.0          4.42X Average Risk          9.6          7.13X Average Risk          15.0          11.0                      . NonHDL 12/31/2019 73.62   Final   NOTE:  Non-HDL goal should be 30 mg/dL higher than patient's LDL goal (i.e. LDL goal of < 70 mg/dL, would have non-HDL goal of < 100 mg/dL)  . Hemoglobin A1C 12/31/2019 7.3* 4.0 - 5.6 % Final   ACR, GFR, LDL, at goal.  Philemon Kingdom, MD PhD [facility]

## 2019-12-31 NOTE — Patient Instructions (Signed)
Please continue: - Metformin 1000 mg at dinnertime - Glipizide ER 5 mg before b'fast  - Glipizide ER 5 mg before dinner (2 halves of a tablet)  Please add: - Farxiga 10 mg before b'fast  Please return in 3-4 months with your sugar log.

## 2020-01-26 ENCOUNTER — Telehealth: Payer: Self-pay | Admitting: Internal Medicine

## 2020-01-26 MED ORDER — GLIPIZIDE ER 5 MG PO TB24: ORAL_TABLET | ORAL | 1 refills | Status: DC

## 2020-01-26 NOTE — Telephone Encounter (Signed)
Patient called re: RX for Glipizide. Patient is going out of town at the end of June and requests that Dr. Cruzita Lederer write the RX for Glipizide so that the RX  shows the way that patient currently takes the medication, which is:  Patient takes 1 whole tablet in the morning. If patient has a large meal at dinner, patient takes 2 half tablets at dinner. Currently the RX does not read the extra 1/2 tablet at dinner.  Patient requests that a new RX for Glipizide that reflects the above dosage be sent to:   [facility] [place] Phone:  [phone]  Fax:  [phone]     Also,  Patient requests that Lenna Sciara call him at ph# [phone] re: patient would like to update Melissa on his updated blood sugar numbers.

## 2020-01-26 NOTE — Telephone Encounter (Signed)
RX update and sent to pharmacy.  MyChart message sent to patient to let himknow this and to ask him to send his blood sugar reading in MyChart for the doctor to review.

## 2020-02-06 ENCOUNTER — Other Ambulatory Visit: Payer: Self-pay | Admitting: Internal Medicine

## 2020-02-07 ENCOUNTER — Other Ambulatory Visit: Payer: Self-pay

## 2020-02-07 DIAGNOSIS — E1122 Type 2 diabetes mellitus with diabetic chronic kidney disease: Secondary | ICD-10-CM

## 2020-02-07 MED ORDER — ONETOUCH ULTRA VI STRP: 1.0000 | ORAL_STRIP | Freq: Every day | 0 refills | Status: DC

## 2020-02-10 ENCOUNTER — Telehealth: Payer: Self-pay | Admitting: Internal Medicine

## 2020-02-10 DIAGNOSIS — E1122 Type 2 diabetes mellitus with diabetic chronic kidney disease: Secondary | ICD-10-CM

## 2020-02-10 MED ORDER — ONETOUCH ULTRA VI STRP: 1.0000 | ORAL_STRIP | Freq: Every day | 6 refills | Status: DC

## 2020-02-10 NOTE — Telephone Encounter (Signed)
Dr. Suezanne Jacquet from [facility] ph# [phone] requests that the Diagnosis Code for One Touch Ultra Blue be sent to the [facility] on the hard copy fax# [phone]

## 2020-02-10 NOTE — Telephone Encounter (Signed)
The DX code WAS INCLUDED.  Will send AGAIN.

## 2020-03-01 ENCOUNTER — Other Ambulatory Visit: Payer: Self-pay | Admitting: Internal Medicine

## 2020-03-01 DIAGNOSIS — E119 Type 2 diabetes mellitus without complications: Secondary | ICD-10-CM

## 2020-03-06 ENCOUNTER — Other Ambulatory Visit: Payer: Self-pay | Admitting: Endocrinology

## 2020-03-06 NOTE — Telephone Encounter (Signed)
Forwarding

## 2020-05-04 ENCOUNTER — Ambulatory Visit: Payer: Medicare Other | Admitting: Internal Medicine

## 2020-05-29 ENCOUNTER — Other Ambulatory Visit: Payer: Self-pay | Admitting: Internal Medicine

## 2020-06-25 ENCOUNTER — Other Ambulatory Visit: Payer: Self-pay | Admitting: Emergency Medicine

## 2020-06-25 DIAGNOSIS — R21 Rash and other nonspecific skin eruption: Secondary | ICD-10-CM

## 2020-06-25 NOTE — Telephone Encounter (Signed)
Requested Prescriptions  Pending Prescriptions Disp Refills  . triamcinolone cream (KENALOG) 0.1 % [Pharmacy Med Name: Triamcinolone Acetonide 0.1 % External Cream] 45 g 0    Sig: APPLY  CREAM EXTERNALLY TO AFFECTED AREA TWICE DAILY     Dermatology:  Corticosteroids Passed - 06/25/2020  6:30 AM      Passed - Valid encounter within last 12 months    Recent Outpatient Visits          7 months ago Wheezing   [facility], NP   8 months ago Medicare annual wellness visit, subsequent   [facility] at [facility] [place], MD   9 months ago Cellulitis of left lower leg   Primary Care at [facility], Ines Bloomer, MD   1 year ago Rash and nonspecific skin eruption   Primary Care at [facility], Ines Bloomer, MD   2 years ago Eyelid cellulitis, left   Primary Care at [facility] Ines Bloomer, MD

## 2020-06-29 ENCOUNTER — Other Ambulatory Visit: Payer: Self-pay | Admitting: Internal Medicine

## 2020-07-03 ENCOUNTER — Encounter: Payer: Self-pay | Admitting: Internal Medicine

## 2020-07-03 ENCOUNTER — Other Ambulatory Visit: Payer: Self-pay

## 2020-07-03 ENCOUNTER — Telehealth (INDEPENDENT_AMBULATORY_CARE_PROVIDER_SITE_OTHER): Payer: Medicare Other | Admitting: Internal Medicine

## 2020-07-03 ENCOUNTER — Telehealth: Payer: Self-pay | Admitting: Internal Medicine

## 2020-07-03 DIAGNOSIS — E1122 Type 2 diabetes mellitus with diabetic chronic kidney disease: Secondary | ICD-10-CM

## 2020-07-03 DIAGNOSIS — E785 Hyperlipidemia, unspecified: Secondary | ICD-10-CM

## 2020-07-03 MED ORDER — GLIPIZIDE ER 5 MG PO TB24
ORAL_TABLET | ORAL | 3 refills | Status: DC
Start: 2020-07-03 — End: 2020-10-30

## 2020-07-03 NOTE — Progress Notes (Addendum)
Patient ID: Rodney Mcalpine., male   DOB: [birth year], 69 y.o.   MRN: [id number]   Patient location: Home My location: Office Persons participating in the virtual visit: patient, provider  Referring Provider: Horald Pollen, MD  I connected with the patient on 07/03/20 at  3:10 PM EST by a video enabled telemedicine application and verified that I am speaking with the correct person.   I discussed the limitations of evaluation and management by telemedicine and the availability of in person appointments. The patient expressed understanding and agreed to proceed.   Details of the encounter are shown below.  HPI: Rodney Leveque. is a 69 y.o.-year-old male, initially referred by his PCP, Dr. Everlene Farrier, presenting for f/u for DM2, dx in 03/2016, non-insulin-dependent, uncontrolled, with complications (mild CKD). His wife, Rodney Turner, is also my pt. last visit was 6 months ago (virtual).  Reviewed history: In 01/2016, he started to feel very fatigued, frequent urination, nocturia, weight loss - saw PCP >> HbA1c very high (>14%). Prev. HbA1c levels were in the 6-7% range.  Reviewed HbA1c levels: Lab Results  Component Value Date   HGBA1C 7.3 (A) 12/31/2019   HGBA1C 5.9 (A) 05/19/2018   HGBA1C 5.9 11/17/2017   Pt is on a regimen of: - Metformin 1000 mg at dinnertime - Glipizide ER 5 mg before b'fast  - Glipizide ER 5 mg before dinner (2 halves of a tablet) - -recommended 12/2019 Since last visit, he contacted me in 07/2019 with high blood sugars.  I suggested Ozempic but he did not start this as he did not want to use injectables.  Pt checks his sugars 1-2 times a day: - am:  150-180 >> 140-175, 200 >> 166-202 >> 130-150, 170 - 2h after b'fast: n/c - before lunch: 100-131 >> 115-137, 171 >> <150 >> n/c - 2h after lunch: 230 (double cheeseburger and fries) >> n/c  - before dinner:  110-120, 140 >> 102, 105, 124-151, 171 >> 95-125, 135 - 2h after dinner: n/c >> 120-159 >>  n/c - bedtime: 149 >> <180 >> 175, 180 >> n/c - nighttime: n/c Lowest sugar was 88 >> 98 >> 103; he has hypoglycemia awareness in the 70s. Highest sugar was 171 >> 205 >> 200s.   Pt's meals are: - Breakfast: McDonalds >> burrito - Lunch: grilled chicken sandwich - Dinner: Chick-fil-a or [employer]'s salad or meat + veggies + starch - Snacks: 1: chips, popcorn, fruit cups, fresh fruit He started to go to the gym. He also bought a treadmill.  -+ Mild CKD. Last BUN/creatinine:  Lab Results  Component Value Date   BUN 17 12/31/2019   BUN 14 05/19/2017   CREATININE 0.98 12/31/2019   CREATININE 1.13 05/19/2017   -+ HL; last set of lipids: Lab Results  Component Value Date   CHOL 103 12/31/2019   HDL 29.40 (L) 12/31/2019   LDLCALC 43 12/31/2019   TRIG 152.0 (H) 12/31/2019   CHOLHDL 4 12/31/2019  On Lipitor 10 - last eye exam was in 01/2018: No DR. Dr. Sabra Heck. -No numbness and tingling in his feet. On ASA.  He also has OSA.  He retired 05/2017.  ROS: Constitutional: no weight gain/+ weight loss, no fatigue, no subjective hyperthermia, no subjective hypothermia Eyes: no blurry vision, no xerophthalmia ENT: no sore throat, no nodules palpated in neck, no dysphagia, no odynophagia, no hoarseness Cardiovascular: no CP/no SOB/no palpitations/no leg swelling Respiratory: no cough/no SOB/no wheezing Gastrointestinal: no N/no V/no D/no C/no acid reflux Musculoskeletal:  no muscle aches/no joint aches Skin: no rashes, no hair loss Neurological: no tremors/no numbness/no tingling/no dizziness  I reviewed pt's medications, allergies, PMH, social hx, family hx, and changes were documented in the history of present illness. Otherwise, unchanged from my initial visit note.  Past Medical History:  Diagnosis Date  . Allergic rhinitis   . Diabetes mellitus ([place]) 05/24/2014  . History of kidney stones 1990's  . Morbid obesity ([place]) 03/17/2014  . OBSTRUCTIVE SLEEP APNEA 02/22/2008   HST 2013:   AHI 51/hr.  Optimal pressure 12cm on autotitration.    . OSA on CPAP    Past Surgical History:  Procedure Laterality Date  . KNEE SURGERY     left   Social History   Social History  . Marital status: Married    Spouse name: N/A  . Number of children: 2   Occupational History  . sales    Social History Main Topics  . Smoking status: Never Smoker  . Smokeless tobacco: Never Used  . Alcohol use No  . Drug use: No   Current Outpatient Medications on File Prior to Visit  Medication Sig Dispense Refill  . albuterol (VENTOLIN HFA) 108 (90 Base) MCG/ACT inhaler Inhale 2 puffs into the lungs every 6 (six) hours as needed for wheezing or shortness of breath. 18 g 2  . aspirin 81 MG tablet Take 81 mg by mouth daily.    Rodney Kitchen atorvastatin (LIPITOR) 10 MG tablet TAKE 1 TABLET BY MOUTH ONCE DAILY AT  6  PM 90 tablet 3  . Blood Glucose Monitoring Suppl (ONE TOUCH ULTRA 2) w/Device KIT Use to check sugars daily. 1 each 0  . dapagliflozin propanediol (FARXIGA) 10 MG TABS tablet Take 10 mg by mouth daily before breakfast. 30 tablet 11  . fluticasone (FLONASE) 50 MCG/ACT nasal spray Place 1 spray into both nostrils daily.    Rodney Kitchen glipiZIDE (GLUCOTROL XL) 5 MG 24 hr tablet TAKE 1 TABLET BY MOUTH BEFORE BREAKFAST AND 2 HALVES OF A TABLET BEFORE SUPPER IF HAVING A LARGER MEAL 90 tablet 0  . glucose blood (ONETOUCH ULTRA) test strip 1 each by Other route daily. E11.9 100 each 6  . guaiFENesin (MUCINEX) 600 MG 12 hr tablet Take 1,200 mg by mouth 2 (two) times daily as needed for cough or to loosen phlegm.     Rodney Kitchen ibuprofen (ADVIL,MOTRIN) 100 MG tablet Take 100 mg by mouth every 6 (six) hours as needed for pain.     . Lancets (ONETOUCH ULTRASOFT) lancets Use as instructed check sugar one time daily. 100 each 12  . metFORMIN (GLUCOPHAGE) 500 MG tablet TAKE 1 TABLET BY MOUTH TWICE A DAY WITH MEALS 180 tablet 3  . triamcinolone cream (KENALOG) 0.1 % APPLY  CREAM EXTERNALLY TO AFFECTED AREA TWICE DAILY 45 g 0   No  current facility-administered medications on file prior to visit.   Allergies  Allergen Reactions  . Codeine Other (See Comments)    REACTION: nausea   Family History  Problem Relation Age of Onset  . Snoring Father   . Esophageal varices Father   . Diabetes Paternal Grandmother   . Heart disease Mother   . Gallstones Mother    PE: There were no vitals taken for this visit. Wt Readings from Last 3 Encounters:  12/31/19 255 lb (115.7 kg)  10/25/19 264 lb (119.7 kg)  09/27/19 264 lb (119.7 kg)   Constitutional:  in NAD  The physical exam was not performed (virtual visit).  ASSESSMENT:  1. DM2, non-insulin-dependent, uncontrolled, with complications -Mild CKD  2. HL  3. Obesity class 3 BMI Classification:  < 18.5 underweight   18.5-24.9 normal weight   25.0-29.9 overweight   30.0-34.9 class I obesity   35.0-39.9 class II obesity   ? 40.0 class III obesity   PLAN:  1. Patient with fairly well-controlled type 2 diabetes, on Metformin and sulfonylurea and now also SGLT2 inhibitor, added at last visit. His sugars started to increase during the coronavirus pandemic. In the past I recommended Ozempic, but he did not start this. At last visit, sugars are higher especially in the morning and they were also above goal later in the day, when he was taking before dinner. We discussed about the importance of staying active and also limiting snacks and reducing portions and overall improving diet. He did not want to add an injectable medication at that time but accepted to try Farxiga 10 mg daily. At that time HbA1c was slightly higher, at 7.3%. - at today's visit, sugars are better, at all times of the day, despite the fact that he decided not to start [place] but work on his diet; however, they are still above target in am. Discussed about the importance of consistent exercise.  -We discussed about the possible advantages of adding Farxiga, but he would like to stay without it for  now.  We can continue without the SGLT2 inhibitor especially since his sugars are improving. - I suggested to:  Patient Instructions  Please continue: - Metformin 1000 mg at dinnertime - Glipizide ER 5 mg before b'fast  - Glipizide ER 5 mg before dinner (2 halves of a tablet)  Please return in 3-4 months with your sugar log.  - advised to check sugars at different times of the day - 1x a day, rotating check times - advised for yearly eye exams >> he is not UTD - return to clinic in 3-4 months    2. HL -Reviewed latest lipid panel from 12/2019: LDL at goal, triglycerides very close to goal, HDL low: Lab Results  Component Value Date   CHOL 103 12/31/2019   HDL 29.40 (L) 12/31/2019   LDLCALC 43 12/31/2019   TRIG 152.0 (H) 12/31/2019   CHOLHDL 4 12/31/2019  -Continues Lipitor 10 without side effects  3.  Obesity class 3 -At last visit we added an SGLT2 inhibitor. He continues on [place]. This should also help with weight loss. -He lost 9 pounds before last visit. At that time, we discussed about continuing backing off snacks and to restart exercise -recommended a stationary bike and going to the gym - stable weight at this visit, but lost 10 lbs and then gained them back  Philemon Kingdom, MD PhD [facility]

## 2020-07-03 NOTE — Patient Instructions (Addendum)
Please continue: - Metformin 1000 mg at dinnertime - Glipizide ER 5 mg before b'fast  - Glipizide ER 5 mg before dinner (2 halves of a tablet)  Please return in 3-4 months with your sugar log.

## 2020-07-03 NOTE — Telephone Encounter (Signed)
Are you ok to change your scheduled 3:00 and 3:20 appointments to virtual visits?

## 2020-07-03 NOTE — Telephone Encounter (Signed)
Patient and his wife have appointments with Dr. Cruzita Lederer today (Patient at 3 pm) his wife [name] MRN: [id number] appointment at 3:20 p.m.. They are both requesting to change today's appointments to Virtual Visits. Patient requests to be called at ph# [phone] to be advised.

## 2020-08-15 DIAGNOSIS — Z23 Encounter for immunization: Secondary | ICD-10-CM | POA: Diagnosis not present

## 2020-10-28 ENCOUNTER — Other Ambulatory Visit: Payer: Self-pay | Admitting: Internal Medicine

## 2020-10-30 ENCOUNTER — Ambulatory Visit: Payer: Medicare Other | Admitting: Internal Medicine

## 2020-11-26 ENCOUNTER — Emergency Department (HOSPITAL_COMMUNITY): Payer: Medicare Other

## 2020-11-26 ENCOUNTER — Emergency Department (HOSPITAL_COMMUNITY)
Admission: EM | Admit: 2020-11-26 | Discharge: 2020-11-26 | Disposition: A | Discharge status: Home / Self Care | Payer: Medicare Other | Attending: Emergency Medicine | Admitting: Emergency Medicine

## 2020-11-26 ENCOUNTER — Encounter (HOSPITAL_COMMUNITY): Payer: Self-pay

## 2020-11-26 DIAGNOSIS — Z7984 Long term (current) use of oral hypoglycemic drugs: Secondary | ICD-10-CM | POA: Diagnosis not present

## 2020-11-26 DIAGNOSIS — Y9222 Religious institution as the place of occurrence of the external cause: Secondary | ICD-10-CM | POA: Diagnosis not present

## 2020-11-26 DIAGNOSIS — Z7982 Long term (current) use of aspirin: Secondary | ICD-10-CM | POA: Diagnosis not present

## 2020-11-26 DIAGNOSIS — N189 Chronic kidney disease, unspecified: Secondary | ICD-10-CM | POA: Insufficient documentation

## 2020-11-26 DIAGNOSIS — S4992XA Unspecified injury of left shoulder and upper arm, initial encounter: Secondary | ICD-10-CM | POA: Diagnosis present

## 2020-11-26 DIAGNOSIS — E1122 Type 2 diabetes mellitus with diabetic chronic kidney disease: Secondary | ICD-10-CM | POA: Insufficient documentation

## 2020-11-26 DIAGNOSIS — R519 Headache, unspecified: Secondary | ICD-10-CM | POA: Diagnosis not present

## 2020-11-26 DIAGNOSIS — W19XXXA Unspecified fall, initial encounter: Secondary | ICD-10-CM | POA: Diagnosis not present

## 2020-11-26 DIAGNOSIS — S42252A Displaced fracture of greater tuberosity of left humerus, initial encounter for closed fracture: Secondary | ICD-10-CM | POA: Insufficient documentation

## 2020-11-26 DIAGNOSIS — I6789 Other cerebrovascular disease: Secondary | ICD-10-CM | POA: Diagnosis not present

## 2020-11-26 MED ORDER — MORPHINE SULFATE (PF) 4 MG/ML IV SOLN
4.0000 mg | Freq: Once | INTRAVENOUS | Status: AC
  Administered 2020-11-26: 4 mg via INTRAMUSCULAR
  Filled 2020-11-26: qty 1

## 2020-11-26 MED ORDER — METHOCARBAMOL 500 MG PO TABS: 500.0000 mg | ORAL_TABLET | Freq: Two times a day (BID) | ORAL | 0 refills | Status: DC

## 2020-11-26 MED ORDER — ONDANSETRON 4 MG PO TBDP: 4.0000 mg | ORAL_TABLET | Freq: Three times a day (TID) | ORAL | 0 refills | Status: DC | PRN

## 2020-11-26 MED ORDER — ONDANSETRON 4 MG PO TBDP
4.0000 mg | ORAL_TABLET | Freq: Once | ORAL | Status: AC
  Administered 2020-11-26: 4 mg via ORAL
  Filled 2020-11-26: qty 1

## 2020-11-26 MED ORDER — OXYCODONE HCL 5 MG PO TABS: 5.0000 mg | ORAL_TABLET | ORAL | 0 refills | Status: DC | PRN

## 2020-11-26 MED ORDER — HYDROCODONE-ACETAMINOPHEN 5-325 MG PO TABS
1.0000 | ORAL_TABLET | Freq: Once | ORAL | Status: AC
  Administered 2020-11-26: 1 via ORAL
  Filled 2020-11-26: qty 1

## 2020-11-26 NOTE — ED Provider Notes (Signed)
[facility] DEPT Provider Note   CSN: [id number] Arrival date & time: 11/26/20  1937     History Chief Complaint  Patient presents with  . Fall    Rodney Turner. is a 71 y.o. male with past medical history significant for diabetes, obesity who presents for evaluation of left shoulder pain.  Was at church when he had a mechanical fall.  Reached out his left arm to catch himself felt immediate pain to his left shoulder.  Has had difficulty with movement due to pain since.  He denies any pain to his left forearm, hand, wrist, elbow.  He denies hitting his head, LOC or any coagulation.  He does not take anything for his symptoms.  No headache, lightheadedness, dizziness, chest pain, shortness of breath, abdominal pain.  He has been ambulatory since the incident.  Denies additional aggravating or alleviating factors. Pain a 7/10.  History obtained from patient and past medical records.  No interpreter used  HPI     Past Medical History:  Diagnosis Date  . Allergic rhinitis   . Diabetes mellitus ([place]) 05/24/2014  . History of kidney stones 1990's  . Morbid obesity ([place]) 03/17/2014  . OBSTRUCTIVE SLEEP APNEA 02/22/2008   HST 2013:  AHI 51/hr.  Optimal pressure 12cm on autotitration.    . OSA on CPAP     Patient Active Problem List   Diagnosis Date Noted  . 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 Hyperlipidemia 06/06/2014  . LBBB (left bundle branch block) 06/06/2014  . Morbid obesity ([place]) 03/17/2014  . Obstructive sleep apnea 02/22/2008    Past Surgical History:  Procedure Laterality Date  . KNEE SURGERY     left       Family History  Problem Relation Age of Onset  . Snoring Father   . Esophageal varices Father   . Diabetes Paternal Grandmother   . Heart disease Mother   . Gallstones Mother     Social History   Tobacco Use  . Smoking status: Never Smoker  . Smokeless tobacco: Never Used   Substance Use Topics  . Alcohol use: No  . Drug use: No    Home Medications Prior to Admission medications   Medication Sig Start Date End Date Taking? Authorizing Provider  methocarbamol (ROBAXIN) 500 MG tablet Take 1 tablet (500 mg total) by mouth 2 (two) times daily. 11/26/20  Yes Jamya Starry A, PA-C  ondansetron (ZOFRAN ODT) 4 MG disintegrating tablet Take 1 tablet (4 mg total) by mouth every 8 (eight) hours as needed for nausea or vomiting. 11/26/20  Yes Ziquan Fidel A, PA-C  oxyCODONE (ROXICODONE) 5 MG immediate release tablet Take 1 tablet (5 mg total) by mouth every 4 (four) hours as needed for severe pain. 11/26/20  Yes Jefferey Lippmann A, PA-C  albuterol (VENTOLIN HFA) 108 (90 Base) MCG/ACT inhaler Inhale 2 puffs into the lungs every 6 (six) hours as needed for wheezing or shortness of breath. 11/05/19   Maximiano Coss, NP  aspirin 81 MG tablet Take 81 mg by mouth daily.    [provider]  atorvastatin (LIPITOR) 10 MG tablet TAKE 1 TABLET BY MOUTH ONCE DAILY AT  6  PM 03/02/20   Philemon Kingdom, MD  Blood Glucose Monitoring Suppl (ONE TOUCH ULTRA 2) w/Device KIT Use to check sugars daily. 06/03/16   Philemon Kingdom, MD  dapagliflozin propanediol (FARXIGA) 10 MG TABS tablet Take 10 mg by mouth daily before breakfast.  12/31/19   Philemon Kingdom, MD  fluticasone (FLONASE) 50 MCG/ACT nasal spray Place 1 spray into both nostrils daily.    [provider]  glipiZIDE (GLUCOTROL XL) 5 MG 24 hr tablet TAKE 1 TABLET BEFORE BREAKFAST AND 2 TABLETS BEFORE SUPPER IF HAVING A LARGER MEAL 10/30/20   Philemon Kingdom, MD  glucose blood (ONETOUCH ULTRA) test strip 1 each by Other route daily. E11.9 02/10/20   Philemon Kingdom, MD  guaiFENesin (MUCINEX) 600 MG 12 hr tablet Take 1,200 mg by mouth 2 (two) times daily as needed for cough or to loosen phlegm.     [provider]  ibuprofen (ADVIL,MOTRIN) 100 MG tablet Take 100 mg by mouth every 6 (six) hours as needed  for pain.     [provider]  Lancets [facility] ULTRASOFT) lancets Use as instructed check sugar one time daily. 04/03/17   Philemon Kingdom, MD  metFORMIN (GLUCOPHAGE) 500 MG tablet TAKE 1 TABLET BY MOUTH TWICE A DAY WITH MEALS 03/06/20   Philemon Kingdom, MD  triamcinolone cream (KENALOG) 0.1 % APPLY  CREAM EXTERNALLY TO AFFECTED AREA TWICE DAILY 06/25/20   Horald Pollen, MD    Allergies    Codeine  Review of Systems   Review of Systems  Constitutional: Negative.   HENT: Negative.   Respiratory: Negative.   Cardiovascular: Negative.   Gastrointestinal: Negative.   Genitourinary: Negative.   Musculoskeletal: Negative for back pain, gait problem, joint swelling, myalgias, neck pain and neck stiffness.       Left shoulder pain  Skin: Negative.   Neurological: Negative.   All other systems reviewed and are negative.   Physical Exam Updated Vital Signs BP 126/90 (BP Location: Right Arm)   Pulse 65   Temp 98.2 F (36.8 C) (Oral)   Resp 16   SpO2 97%   Physical Exam Vitals and nursing note reviewed.  Constitutional:      General: He is not in acute distress.    Appearance: He is well-developed. He is not ill-appearing, toxic-appearing or diaphoretic.  HENT:     Head: Normocephalic and atraumatic.     Nose: Nose normal.     Mouth/Throat:     Mouth: Mucous membranes are moist.  Eyes:     Pupils: Pupils are equal, round, and reactive to light.  Cardiovascular:     Rate and Rhythm: Normal rate and regular rhythm.     Pulses: Normal pulses.          Radial pulses are 2+ on the right side and 2+ on the left side.     Heart sounds: Normal heart sounds.  Pulmonary:     Effort: Pulmonary effort is normal. No respiratory distress.     Breath sounds: Normal breath sounds and air entry.     Comments: Clear to auscultation bilaterally Chest:     Comments: No bony tenderness to chest wall, clavicle or scapula Abdominal:     General: Bowel sounds are normal.  There is no distension.     Palpations: Abdomen is soft.     Tenderness: There is no abdominal tenderness. There is no right CVA tenderness, left CVA tenderness, guarding or rebound.     Hernia: No hernia is present.     Comments: Soft, non tender  Musculoskeletal:     Right shoulder: Normal.     Left shoulder: Tenderness and bony tenderness present. Decreased range of motion.     Right upper arm: Normal.     Left upper arm:  Normal.     Right elbow: Normal.     Left elbow: Normal.     Right forearm: Normal.     Left forearm: Normal.     Right wrist: Normal.     Left wrist: Normal.     Right hand: Normal.     Left hand: Normal.       Arms:     Cervical back: Normal range of motion and neck supple.     Comments: Tenderness at left shoulder and humeral head.  No bony tenderness to midshaft, distal humerus, olecranon, forearm or hand.  No midline cervical, thoracic tenderness.  No wrist drop. No bony tenderness to clavicle, scapula, ribs.  Pelvis stable, nontender palpation.  No shortening or rotation of legs.  No bony tenderness to femur, knees, tib-fib bilaterally  Skin:    General: Skin is warm and dry.     Capillary Refill: Capillary refill takes less than 2 seconds.     Comments: No edema, erythema or warmth  Neurological:     General: No focal deficit present.     Mental Status: He is alert and oriented to person, place, and time.     ED Results / Procedures / Treatments   Labs (all labs ordered are listed, but only abnormal results are displayed) Labs Reviewed - No data to display  EKG None  Radiology CT Head Wo Contrast  Result Date: 11/26/2020 CLINICAL DATA:  Pain following fall EXAM: CT HEAD WITHOUT CONTRAST TECHNIQUE: Contiguous axial images were obtained from the base of the skull through the vertex without intravenous contrast. COMPARISON:  None. FINDINGS: Brain: There is mild diffuse atrophy. There is no intracranial mass, hemorrhage, extra-axial fluid collection,  or midline shift. There is mild patchy small vessel disease in the centra semiovale bilaterally. Elsewhere brain parenchyma appears unremarkable. No evident acute infarct. Vascular: No hyperdense vessel. No appreciable vascular calcification. Skull: The bony calvarium appears intact. Sinuses/Orbits: Mucosal thickening noted in several ethmoid air cells. Other visualized paranasal sinuses are clear. Orbits appear symmetric bilaterally. Other: Mastoid air cells are clear. IMPRESSION: Mild atrophy with mild periventricular small vessel disease. No acute infarct. No mass or hemorrhage. Mucosal thickening noted in several ethmoid air cells. Electronically Signed   By: Lowella Grip III M.D.   On: 11/26/2020 13:27   DG Shoulder Left  Result Date: 11/26/2020 CLINICAL DATA:  Pain following fall EXAM: LEFT SHOULDER - 2+ VIEW COMPARISON:  None. FINDINGS: Oblique and Y scapular images were obtained. There is a fracture of the left greater tuberosity with mild separation of fracture fragments. No other evident fracture. No dislocation. No appreciable joint space narrowing or erosion. IMPRESSION: Mild lateral displacement of fracture of the lateral tuberosity of the proximal humerus. No other fracture. No dislocation. No appreciable joint space narrowing or erosion. Electronically Signed   By: Lowella Grip III M.D.   On: 11/26/2020 11:02    Procedures .Splint Application  Date/Time: 11/26/2020 1:57 PM Performed by: Nettie Elm, PA-C Authorized by: Nettie Elm, PA-C   Consent:    Consent obtained:  Verbal   Consent given by:  Patient   Risks, benefits, and alternatives were discussed: yes     Risks discussed:  Numbness, discoloration, pain and swelling   Alternatives discussed:  Referral, observation, alternative treatment, delayed treatment and no treatment Universal protocol:    Procedure explained and questions answered to patient or proxy's satisfaction: yes     Relevant documents  present and verified: yes  Test results available: yes     Imaging studies available: yes     Required blood products, implants, devices, and special equipment available: yes     Site/side marked: yes     Immediately prior to procedure a time out was called: yes     Patient identity confirmed:  Verbally with patient Pre-procedure details:    Distal neurologic exam:  Normal   Distal perfusion: distal pulses strong and brisk capillary refill   Procedure details:    Location:  Shoulder   Shoulder location:  L shoulder   Strapping: no     Upper extremity splint type: shoudler immobilizer.   Supplies:  Sling   Attestation: Splint applied and adjusted personally by me   Post-procedure details:    Distal neurologic exam:  Normal   Distal perfusion: distal pulses strong and brisk capillary refill     Procedure completion:  Tolerated well, no immediate complications   Post-procedure imaging: not applicable       Medications Ordered in ED Medications  HYDROcodone-acetaminophen (NORCO/VICODIN) 5-325 MG per tablet 1 tablet (1 tablet Oral Given 11/26/20 1142)  ondansetron (ZOFRAN-ODT) disintegrating tablet 4 mg (4 mg Oral Given 11/26/20 1142)  morphine 4 MG/ML injection 4 mg (4 mg Intramuscular Given 11/26/20 1334)    ED Course  I have reviewed the triage vital signs and the nursing notes.  Pertinent labs & imaging results that were available during my care of the patient were reviewed by me and considered in my medical decision making (see chart for details).   70 year old here for evaluation of left shoulder pain after mechanical fall just PTA.  Afebrile, nonseptic, non-ill-appearing.  No preceding symptoms prior to fall. Patient denies any syncope.  No hitting head, LOC or anticoagulation.  Has been ambulatory since the fall.  Tenderness to left lateral humeral head.  He is neurovascularly intact.  No bony tenderness to midshaft, distal humerus, olecranon, hand or forearm.  No tenderness  to clavicle, scapula, anterior/ posterior chest wall.  No midline cervical or thoracic tenderness. Plan on pain management and imaging.   Imaging personally reviewed and interpreted:  Left shoulder xray shows fracture to left greater tuberosity of humerus. No dislocation. CT head without acute abnormality  Patient reassessed.  Pain controlled.  He continues to be neurovascularly intact.  Was placed in sling for his fracture.  We will have him follow-up with orthopedics.  He has been seen by Dr. Gladstone Lighter with [facility] previously.  He is on a short course of pain management, muscle relaxers.  RICE for symptomatic management.  Discussed with attending, Dr. Kathrynn Humble who agrees above treatment, plan and disposition.  The patient has been appropriately medically screened and/or stabilized in the ED. I have low suspicion for any other emergent medical condition which would require further screening, evaluation or treatment in the ED or require inpatient management.  Patient is hemodynamically stable and in no acute distress.  Patient able to ambulate in department prior to ED.  Evaluation does not show acute pathology that would require ongoing or additional emergent interventions while in the emergency department or further inpatient treatment.  I have discussed the diagnosis with the patient and answered all questions.  Pain is been managed while in the emergency department and patient has no further complaints prior to discharge.  Patient is comfortable with plan discussed in room and is stable for discharge at this time.  I have discussed strict return precautions for returning to the emergency department.  Patient was  encouraged to follow-up with PCP/specialist refer to at discharge.    MDM Rules/Calculators/A&P                           Final Clinical Impression(s) / ED Diagnoses Final diagnoses:  Fall, initial encounter  Displaced fracture of greater tuberosity of left humerus, initial  encounter for closed fracture    Rx / DC Orders ED Discharge Orders         Ordered    oxyCODONE (ROXICODONE) 5 MG immediate release tablet  Every 4 hours PRN        11/26/20 1400    ondansetron (ZOFRAN ODT) 4 MG disintegrating tablet  Every 8 hours PRN        11/26/20 1400    methocarbamol (ROBAXIN) 500 MG tablet  2 times daily        11/26/20 1400           Merriel Zinger A, PA-C 11/26/20 1400    Varney Biles, MD 11/28/20 1947

## 2020-11-26 NOTE — ED Triage Notes (Signed)
Emergency Medicine Provider Triage Evaluation Note  Rodney Turner. , a 70 y.o. male  was evaluated in triage.  Pt complains of left shoulder pain mechanical fall.  Did not hit head, LOC or anticoagulation.  Has been ambulatory since.  Difficulty moving left shoulder since fall.  No paresthesias, weakness.  Full range of motion to elbow, hand.  Review of Systems  Positive: Left shoulder pain Negative: Headache, LOC, coagulation use, paresthesias or  Physical Exam  BP (!) 143/68 (BP Location: Left Arm)   Pulse 60   Temp (!) 97.4 F (36.3 C) (Oral)   Resp 18   SpO2 100%  Gen:   Awake, no distress   HEENT:  Atraumatic  Resp:  Normal effort  Cardiac:  Normal rate, 2 + radial pulses Abd:   Nondistended, nontender  MSK:   Tenderness at left humeral head Neuro:  Speech clear. Intact sensation  Medical Decision Making  Medically screening exam initiated at 10:37 AM.  Appropriate orders placed.  Rodney Turner. was informed that the remainder of the evaluation will be completed by another provider, this initial triage assessment does not replace that evaluation, and the importance of remaining in the ED until their evaluation is complete.  Clinical Impression  Fall, left shoulder pain  Plan on xray   Brystol Wasilewski A, PA-C 11/26/20 1120

## 2020-11-26 NOTE — Discharge Instructions (Addendum)
It was a pleasure taking care of you here in the emergency department today.  Your x-ray did show a fracture of part of your shoulder.  We have placed you in a sling.  I have written you for a short course of pain medicine.  This medication may make you sleepy.  This medication is also addictive.  Take as needed.  Do not drive or operate heavy machinery while taking this medication.  Follow-up with [facility] orthopedics/[facility] within the next 2 days  Return for any worsening symptoms

## 2020-11-26 NOTE — ED Triage Notes (Signed)
Pt arrived via walk in, c/o mechanical fall x3 hrs PTA. No head strike, no LOC, no blood thinners. C/o right shoulder pain.

## 2020-12-01 DIAGNOSIS — S42202A Unspecified fracture of upper end of left humerus, initial encounter for closed fracture: Secondary | ICD-10-CM | POA: Diagnosis not present

## 2020-12-11 ENCOUNTER — Other Ambulatory Visit: Payer: Self-pay | Admitting: Orthopedic Surgery

## 2020-12-11 ENCOUNTER — Ambulatory Visit
Admission: RE | Admit: 2020-12-11 | Discharge: 2020-12-11 | Disposition: A | Discharge status: Home / Self Care | Payer: Medicare Other | Source: Ambulatory Visit | Attending: Orthopedic Surgery | Admitting: Orthopedic Surgery

## 2020-12-11 DIAGNOSIS — M25512 Pain in left shoulder: Secondary | ICD-10-CM | POA: Diagnosis not present

## 2020-12-11 DIAGNOSIS — S42292A Other displaced fracture of upper end of left humerus, initial encounter for closed fracture: Secondary | ICD-10-CM | POA: Diagnosis not present

## 2020-12-14 DIAGNOSIS — S42272D Torus fracture of upper end of left humerus, subsequent encounter for fracture with routine healing: Secondary | ICD-10-CM | POA: Diagnosis not present

## 2020-12-21 DIAGNOSIS — S42272D Torus fracture of upper end of left humerus, subsequent encounter for fracture with routine healing: Secondary | ICD-10-CM | POA: Diagnosis not present

## 2020-12-25 ENCOUNTER — Encounter: Payer: Self-pay | Admitting: Internal Medicine

## 2020-12-25 ENCOUNTER — Ambulatory Visit (INDEPENDENT_AMBULATORY_CARE_PROVIDER_SITE_OTHER): Payer: Medicare Other | Admitting: Internal Medicine

## 2020-12-25 ENCOUNTER — Other Ambulatory Visit: Payer: Self-pay

## 2020-12-25 VITALS — BP 122/78 | HR 67 | Ht 67.0 in | Wt 260.2 lb

## 2020-12-25 DIAGNOSIS — E785 Hyperlipidemia, unspecified: Secondary | ICD-10-CM | POA: Diagnosis not present

## 2020-12-25 DIAGNOSIS — E1122 Type 2 diabetes mellitus with diabetic chronic kidney disease: Secondary | ICD-10-CM

## 2020-12-25 LAB — POCT GLYCOSYLATED HEMOGLOBIN (HGB A1C): Hemoglobin A1C: 7.9 % — AB (ref 4.0–5.6)

## 2020-12-25 MED ORDER — GLIPIZIDE ER 5 MG PO TB24: ORAL_TABLET | ORAL | 3 refills | Status: DC

## 2020-12-25 MED ORDER — METFORMIN HCL 500 MG PO TABS: 1.0000 | ORAL_TABLET | Freq: Two times a day (BID) | ORAL | 3 refills | Status: DC

## 2020-12-25 NOTE — Patient Instructions (Addendum)
Please continue: - Metformin 1000 mg at dinnertime - Glipizide ER 5 mg before b'fast  - Glipizide ER 5 mg before dinner (2 halves of a tablet)  Continue to work on [employer].  Please return in 4 months with your sugar log.

## 2020-12-25 NOTE — Progress Notes (Signed)
Patient ID: Rodney Turner., male   DOB: [birth year], 70 y.o.   MRN: [id number]   This visit occurred during the SARS-CoV-2 public health emergency.  Safety protocols were in place, including screening questions prior to the visit, additional usage of staff PPE, and extensive cleaning of exam room while observing appropriate contact time as indicated for disinfecting solutions.   HPI: Rodney Turner. is a 70 y.o.-year-old male, initially referred by his PCP, Dr. Everlene Farrier, presenting for f/u for DM2, dx in 03/2016, non-insulin-dependent, uncontrolled, with complications (mild CKD). His wife, Rodney Turner, is also my pt. last visit was 6 months ago (virtual).  Interim history: He fell and fractured his left humerus on 11/26/2020. He relaxed his diet in last 3 mo: more sweets. Also, not exercising. He just restarted walking in the neighborhood. No increased urination, blurry vision, nausea.  Reviewed history: In 01/2016, he started to feel very fatigued, frequent urination, nocturia, weight loss - saw PCP >> HbA1c very high (>14%). Prev. HbA1c levels were in the 6-7% range.  Reviewed HbA1c levels: Lab Results  Component Value Date   HGBA1C 7.3 (A) 12/31/2019   HGBA1C 5.9 (A) 05/19/2018   HGBA1C 5.9 11/17/2017   Pt is on a regimen of: - Metformin 1000 mg at dinnertime - Glipizide ER 5 mg before b'fast  - Glipizide ER 5 mg before dinner (2 halves of a tablet) Since last visit, he contacted me in 07/2019 with high blood sugars.  I suggested Ozempic but he did not start this as he did not want to use injectables.  He also refused to start [place].  Pt checks his sugars 1-2 times a day: - am:  140-175, 200 >> 166-202 >> 130-150, 170 >> 150-170, 221 - 2h after b'fast: n/c - before lunch: 100-131 >> 115-137, 171 >> <150 >> n/c - 2h after lunch: 230 (double cheeseburger and fries) >> n/c  - before dinner: 102, 105, 124-151, 171 >> 95-125, 135 >> 100-150 - 2h after dinner: n/c >> 120-159  >> n/c - bedtime: 149 >> <180 >> 175, 180 >> n/c - nighttime: n/c Lowest sugar was 88 >> 98 >> 103 >> 100; he has hypoglycemia awareness in the 70s. Highest sugar was 171 >> 205 >> 200s >> 210.   Pt's meals are: - Breakfast: [employer] >> burrito - Lunch: grilled chicken sandwich - Dinner: Chick-fil-a or Wendy's salad or meat + veggies + starch - Snacks: 1: chips, popcorn, fruit cups, fresh fruit He was previously going to the gym.  He also bought a treadmill.  -+ Mild CKD. Last BUN/creatinine:  Lab Results  Component Value Date   BUN 17 12/31/2019   BUN 14 05/19/2017   CREATININE 0.98 12/31/2019   CREATININE 1.13 05/19/2017   -+ HL; last set of lipids: Lab Results  Component Value Date   CHOL 103 12/31/2019   HDL 29.40 (L) 12/31/2019   LDLCALC 43 12/31/2019   TRIG 152.0 (H) 12/31/2019   CHOLHDL 4 12/31/2019  On Lipitor 10. - last eye exam was in 2021: No DR. Dr. Sabra Heck. -No numbness and tingling in his feet. On ASA.  He also has OSA.  He retired 05/2017.  ROS: Constitutional: no weight gain/no weight loss, no fatigue, no subjective hyperthermia, no subjective hypothermia Eyes: no blurry vision, no xerophthalmia ENT: no sore throat, no nodules palpated in neck, no dysphagia, no odynophagia, no hoarseness Cardiovascular: no CP/no SOB/no palpitations/no leg swelling Respiratory: no cough/no SOB/no wheezing Gastrointestinal: no N/no V/no D/no  C/no acid reflux Musculoskeletal: no muscle aches/no joint aches Skin: no rashes, no hair loss Neurological: no tremors/no numbness/no tingling/no dizziness  I reviewed pt's medications, allergies, PMH, social hx, family hx, and changes were documented in the history of present illness. Otherwise, unchanged from my initial visit note.  Past Medical History:  Diagnosis Date  . Allergic rhinitis   . Diabetes mellitus ([place]) 05/24/2014  . History of kidney stones 1990's  . Morbid obesity ([place]) 03/17/2014  . OBSTRUCTIVE SLEEP APNEA  02/22/2008   HST 2013:  AHI 51/hr.  Optimal pressure 12cm on autotitration.    . OSA on CPAP    Past Surgical History:  Procedure Laterality Date  . KNEE SURGERY     left   Social History   Social History  . Marital status: Married    Spouse name: N/A  . Number of children: 2   Occupational History  . sales    Social History Main Topics  . Smoking status: Never Smoker  . Smokeless tobacco: Never Used  . Alcohol use No  . Drug use: No   Current Outpatient Medications on File Prior to Visit  Medication Sig Dispense Refill  . albuterol (VENTOLIN HFA) 108 (90 Base) MCG/ACT inhaler Inhale 2 puffs into the lungs every 6 (six) hours as needed for wheezing or shortness of breath. 18 g 2  . aspirin 81 MG tablet Take 81 mg by mouth daily.    Rodney Kitchen atorvastatin (LIPITOR) 10 MG tablet TAKE 1 TABLET BY MOUTH ONCE DAILY AT  6  PM 90 tablet 3  . Blood Glucose Monitoring Suppl (ONE TOUCH ULTRA 2) w/Device KIT Use to check sugars daily. 1 each 0  . dapagliflozin propanediol (FARXIGA) 10 MG TABS tablet Take 10 mg by mouth daily before breakfast. 30 tablet 11  . fluticasone (FLONASE) 50 MCG/ACT nasal spray Place 1 spray into both nostrils daily.    Rodney Kitchen glipiZIDE (GLUCOTROL XL) 5 MG 24 hr tablet TAKE 1 TABLET BEFORE BREAKFAST AND 2 TABLETS BEFORE SUPPER IF HAVING A LARGER MEAL 30 tablet 0  . glucose blood (ONETOUCH ULTRA) test strip 1 each by Other route daily. E11.9 100 each 6  . guaiFENesin (MUCINEX) 600 MG 12 hr tablet Take 1,200 mg by mouth 2 (two) times daily as needed for cough or to loosen phlegm.     Rodney Kitchen ibuprofen (ADVIL,MOTRIN) 100 MG tablet Take 100 mg by mouth every 6 (six) hours as needed for pain.     . Lancets (ONETOUCH ULTRASOFT) lancets Use as instructed check sugar one time daily. 100 each 12  . metFORMIN (GLUCOPHAGE) 500 MG tablet TAKE 1 TABLET BY MOUTH TWICE A DAY WITH MEALS 180 tablet 3  . methocarbamol (ROBAXIN) 500 MG tablet Take 1 tablet (500 mg total) by mouth 2 (two) times daily. 20  tablet 0  . ondansetron (ZOFRAN ODT) 4 MG disintegrating tablet Take 1 tablet (4 mg total) by mouth every 8 (eight) hours as needed for nausea or vomiting. 20 tablet 0  . oxyCODONE (ROXICODONE) 5 MG immediate release tablet Take 1 tablet (5 mg total) by mouth every 4 (four) hours as needed for severe pain. 15 tablet 0  . triamcinolone cream (KENALOG) 0.1 % APPLY  CREAM EXTERNALLY TO AFFECTED AREA TWICE DAILY 45 g 0   No current facility-administered medications on file prior to visit.   Allergies  Allergen Reactions  . Codeine Other (See Comments)    REACTION: nausea   Family History  Problem Relation Age of Onset  .  Snoring Father   . Esophageal varices Father   . Diabetes Paternal Grandmother   . Heart disease Mother   . Gallstones Mother    PE: BP 122/78 (BP Location: Right Arm, Patient Position: Sitting, Cuff Size: Normal)   Pulse 67   Ht _0  (1.702 m)   Wt 260 lb 3.2 oz (118 kg)   SpO2 95%   BMI 40.75 kg/m  Wt Readings from Last 3 Encounters:  12/25/20 260 lb 3.2 oz (118 kg)  12/31/19 255 lb (115.7 kg)  10/25/19 264 lb (119.7 kg)   Constitutional: overweight, in NAD Eyes: PERRLA, EOMI, no exophthalmos ENT: moist mucous membranes, no thyromegaly, no cervical lymphadenopathy Cardiovascular: RRR, No MRG Respiratory: CTA B Gastrointestinal: abdomen soft, NT, ND, BS+ Musculoskeletal: no deformities, strength intact in all 4 Skin: moist, warm, + rash (NLD?) on medial side of left lower leg Neurological: no tremor with outstretched hands, DTR normal in all 4  ASSESSMENT: 1. DM2, non-insulin-dependent, uncontrolled, with complications -Mild CKD  2. HL  3. Obesity class 3 BMI Classification:  < 18.5 underweight   18.5-24.9 normal weight   25.0-29.9 overweight   30.0-34.9 class I obesity   35.0-39.9 class II obesity   ? 40.0 class III obesity   PLAN:  1. Patient with fairly well-controlled type 2 diabetes, on metformin and sulfonylurea and now also SGLT2  inhibitor.  His sugars were initially higher during the coronavirus pandemic.  In the past, I did recommend Ozempic but he did not start this.  He did accept to try Farxiga 10 mg daily.  At last visit, sugars are better at all times of the day but he did not start [place] as he wanted to work on his diet.  States sugars were improved, per his preference, we continued without the SGLT2.  His sugars were higher in the morning and we discussed about the importance of consistent exercise.  Latest HbA1c available for review was from a year ago, 7.3%.  We could not check another HbA1c at last visit, since this was a virtual visit. - at this visit, sugars are higher especially as he was less mobile since his humeral fracture and he had dietary indiscretions (he ate many sweets). - he would want to avoid SGLT2 inh for now but wants to work on his diet. - for now, will continue on the same regimen but at next visit we may need to add fark [place] - I suggested to:  Patient Instructions  Please continue: - Metformin 1000 mg at dinnertime - Glipizide ER 5 mg before b'fast  - Glipizide ER 5 mg before dinner (2 halves of a tablet)  Continue to work on [employer].  Please return in 4 months with your sugar log.  - we checked his HbA1c: 7.9% (higher) - advised to check sugars at different times of the day - 1x a day, rotating check times - advised for yearly eye exams >> he is UTD - return to clinic in 4 months    2. HL -Reviewed latest lipid panel from 12/2019: LDL at goal, triglycerides very close to goal, HDL low: Lab Results  Component Value Date   CHOL 103 12/31/2019   HDL 29.40 (L) 12/31/2019   LDLCALC 43 12/31/2019   TRIG 152.0 (H) 12/31/2019   CHOLHDL 4 12/31/2019  -Continues Lipitor 10 without side effects  3.  Obesity class 3 -he refused SGLT2 inhibitors and GLP-1 receptor agonists, which would have helped with weight loss -gained 5 lbs since  last OV  Philemon Kingdom, MD PhD [facility]  Endocrinology

## 2021-01-03 DIAGNOSIS — M25512 Pain in left shoulder: Secondary | ICD-10-CM | POA: Diagnosis not present

## 2021-01-03 DIAGNOSIS — S42272D Torus fracture of upper end of left humerus, subsequent encounter for fracture with routine healing: Secondary | ICD-10-CM | POA: Diagnosis not present

## 2021-01-17 DIAGNOSIS — S42272D Torus fracture of upper end of left humerus, subsequent encounter for fracture with routine healing: Secondary | ICD-10-CM | POA: Diagnosis not present

## 2021-01-23 DIAGNOSIS — S42272D Torus fracture of upper end of left humerus, subsequent encounter for fracture with routine healing: Secondary | ICD-10-CM | POA: Diagnosis not present

## 2021-01-24 DIAGNOSIS — S42272D Torus fracture of upper end of left humerus, subsequent encounter for fracture with routine healing: Secondary | ICD-10-CM | POA: Diagnosis not present

## 2021-01-24 DIAGNOSIS — M25512 Pain in left shoulder: Secondary | ICD-10-CM | POA: Diagnosis not present

## 2021-01-26 DIAGNOSIS — M25512 Pain in left shoulder: Secondary | ICD-10-CM | POA: Diagnosis not present

## 2021-01-30 DIAGNOSIS — M25512 Pain in left shoulder: Secondary | ICD-10-CM | POA: Diagnosis not present

## 2021-02-01 DIAGNOSIS — M25512 Pain in left shoulder: Secondary | ICD-10-CM | POA: Diagnosis not present

## 2021-02-06 DIAGNOSIS — M25512 Pain in left shoulder: Secondary | ICD-10-CM | POA: Diagnosis not present

## 2021-02-09 DIAGNOSIS — S42272D Torus fracture of upper end of left humerus, subsequent encounter for fracture with routine healing: Secondary | ICD-10-CM | POA: Diagnosis not present

## 2021-02-13 DIAGNOSIS — S42272D Torus fracture of upper end of left humerus, subsequent encounter for fracture with routine healing: Secondary | ICD-10-CM | POA: Diagnosis not present

## 2021-02-15 DIAGNOSIS — S42272D Torus fracture of upper end of left humerus, subsequent encounter for fracture with routine healing: Secondary | ICD-10-CM | POA: Diagnosis not present

## 2021-03-15 DIAGNOSIS — S42272D Torus fracture of upper end of left humerus, subsequent encounter for fracture with routine healing: Secondary | ICD-10-CM | POA: Diagnosis not present

## 2021-03-16 DIAGNOSIS — S42272D Torus fracture of upper end of left humerus, subsequent encounter for fracture with routine healing: Secondary | ICD-10-CM | POA: Diagnosis not present

## 2021-04-21 ENCOUNTER — Other Ambulatory Visit: Payer: Self-pay | Admitting: Internal Medicine

## 2021-04-21 DIAGNOSIS — E119 Type 2 diabetes mellitus without complications: Secondary | ICD-10-CM

## 2021-05-01 ENCOUNTER — Ambulatory Visit: Payer: Medicare Other | Admitting: Internal Medicine

## 2021-06-13 DIAGNOSIS — E119 Type 2 diabetes mellitus without complications: Secondary | ICD-10-CM | POA: Diagnosis not present

## 2021-06-26 DIAGNOSIS — Z23 Encounter for immunization: Secondary | ICD-10-CM | POA: Diagnosis not present

## 2021-07-16 ENCOUNTER — Ambulatory Visit: Payer: Medicare Other | Admitting: Internal Medicine

## 2021-07-23 ENCOUNTER — Other Ambulatory Visit: Payer: Self-pay | Admitting: Internal Medicine

## 2021-07-23 DIAGNOSIS — E119 Type 2 diabetes mellitus without complications: Secondary | ICD-10-CM

## 2021-09-06 ENCOUNTER — Ambulatory Visit: Payer: 59 | Admitting: Internal Medicine

## 2021-09-21 ENCOUNTER — Ambulatory Visit (INDEPENDENT_AMBULATORY_CARE_PROVIDER_SITE_OTHER): Payer: Medicare Other | Admitting: Internal Medicine

## 2021-09-21 ENCOUNTER — Other Ambulatory Visit: Payer: Self-pay

## 2021-09-21 ENCOUNTER — Encounter: Payer: Self-pay | Admitting: Internal Medicine

## 2021-09-21 VITALS — BP 120/82 | HR 73 | Ht 67.0 in | Wt 243.4 lb

## 2021-09-21 DIAGNOSIS — E785 Hyperlipidemia, unspecified: Secondary | ICD-10-CM | POA: Diagnosis not present

## 2021-09-21 DIAGNOSIS — E1122 Type 2 diabetes mellitus with diabetic chronic kidney disease: Secondary | ICD-10-CM

## 2021-09-21 LAB — POCT GLYCOSYLATED HEMOGLOBIN (HGB A1C): Hemoglobin A1C: 11.1 % — AB (ref 4.0–5.6)

## 2021-09-21 MED ORDER — INSULIN PEN NEEDLE 32G X 4 MM MISC: 3 refills | Status: DC

## 2021-09-21 MED ORDER — TRESIBA FLEXTOUCH 200 UNIT/ML ~~LOC~~ SOPN: 16.0000 [IU] | PEN_INJECTOR | Freq: Every day | SUBCUTANEOUS | 3 refills | Status: DC

## 2021-09-21 NOTE — Patient Instructions (Addendum)
Please continue: - Metformin 1000 mg at dinnertime - Glipizide ER 5 mg before b'fast  - Glipizide ER 5 mg before dinner (2 halves of a tablet)  Please start: - Tresiba U200 16 units daily. Increase by 2-4 units every 4 days until sugars in am <140 or you get to 36 units.   Please return in 1.5 months with your sugar log.

## 2021-09-21 NOTE — Progress Notes (Signed)
Patient ID: Rodney Mcalpine., male   DOB: 12/21/1950, 71 y.o.   MRN: [id number]   This visit occurred during the SARS-CoV-2 public health emergency.  Safety protocols were in place, including screening questions prior to the visit, additional usage of staff PPE, and extensive cleaning of exam room while observing appropriate contact time as indicated for disinfecting solutions.   HPI: Rodney Cotten. is a 71 y.o.-year-old male, initially referred by his PCP, Dr. Everlene Farrier, presenting for f/u for DM2, dx in 03/2016, non-insulin-dependent, uncontrolled, with complications (mild CKD). His wife, Rodney Turner, is also my pt. last visit was 5 months ago.  Interim history: He fell and fractured his left humerus on 11/26/2020.  Sugars are higher afterwards as he was not active and he had dietary indiscretions.  He still continues to be very inactive.  Also, had dietary indiscretions over the holidays. No increased urination, nausea, chest pain. Some blurry vision.  Reviewed history: In 01/2016, he started to feel very fatigued, frequent urination, nocturia, weight loss - saw PCP >> HbA1c very high (>14%). Prev. HbA1c levels were in the 6-7% range.  Reviewed HbA1c levels: Lab Results  Component Value Date   HGBA1C 7.9 (A) 12/25/2020   HGBA1C 7.3 (A) 12/31/2019   HGBA1C 5.9 (A) 05/19/2018   Pt is on a regimen of: - Metformin 1000 mg at dinnertime - Glipizide ER 5 mg before b'fast  - Glipizide ER 5 mg before dinner (2 halves of a tablet) Since last visit, he contacted me in 07/2019 with high blood sugars.  I suggested Ozempic but he did not start this as he did not want to use injectables.  He also refused to start [place].  Pt checks his sugars 1-2 times a day: - am:  140-175, 200 >> 166-202 >> 130-150, 170 >> 150-170, 221 >> 190, 200-300 - 2h after b'fast: n/c - before lunch: 100-131 >> 115-137, 171 >> <150 >> n/c - 2h after lunch: 230 (double cheeseburger and fries) >> n/c  - before  dinner: 102, 105, 124-151, 171 >> 95-125, 135 >> 100-150 >> 180s-200s - 2h after dinner: n/c >> 120-159 >> n/c - bedtime: 149 >> <180 >> 175, 180 >> n/c - nighttime: n/c Lowest sugar was 88 >> 98 >> 103 >> 100 >> 180; he has hypoglycemia awareness in the 70s. Highest sugar was 200s >> 210 >> 300s  Pt's meals are: - Breakfast: McDonal[employer] >> burrito - Lunch: grilled chicken sandwich - Dinner: Chick-fil-a or Wendy's salad or meat + veggies + starch - Snacks: 1: chips, popcorn, fruit cups, fresh fruit He was previously going to the gym.  He also bought a treadmill.  -+ Mild CKD. Last BUN/creatinine:  Lab Results  Component Value Date   BUN 17 12/31/2019   BUN 14 05/19/2017   CREATININE 0.98 12/31/2019   CREATININE 1.13 05/19/2017   -+ HL; last set of lipids: Lab Results  Component Value Date   CHOL 103 12/31/2019   HDL 29.40 (L) 12/31/2019   LDLCALC 43 12/31/2019   TRIG 152.0 (H) 12/31/2019   CHOLHDL 4 12/31/2019  On Lipitor 10.  - last eye exam was in Fall 2022: No DR. + mild cataracts. Dr. Sabra Heck.  -No numbness and tingling in his feet. On ASA.  He also has OSA.  He retired 05/2017.  ROS: + see HPI  I reviewed pt's medications, allergies, PMH, social hx, family hx, and changes were documented in the history of present illness. Otherwise, unchanged from my  initial visit note.  Past Medical History:  Diagnosis Date   Allergic rhinitis    Diabetes mellitus ([place]) 05/24/2014   History of kidney stones 1990's   Morbid obesity ([place]) 03/17/2014   OBSTRUCTIVE SLEEP APNEA 02/22/2008   HST 2013:  AHI 51/hr.  Optimal pressure 12cm on autotitration.     OSA on CPAP    Past Surgical History:  Procedure Laterality Date   KNEE SURGERY     left   Social History   Social History   Marital status: Married    Spouse name: N/A   Number of children: 2   Occupational History   sales    Social History Main Topics   Smoking status: Never Smoker   Smokeless tobacco: Never Used    Alcohol use No   Drug use: No   Current Outpatient Medications on File Prior to Visit  Medication Sig Dispense Refill   albuterol (VENTOLIN HFA) 108 (90 Base) MCG/ACT inhaler Inhale 2 puffs into the lungs every 6 (six) hours as needed for wheezing or shortness of breath. 18 g 2   aspirin 81 MG tablet Take 81 mg by mouth daily.     atorvastatin (LIPITOR) 10 MG tablet TAKE 1 TABLET EVERY DAY AT 6PM 90 tablet 3   Blood Glucose Monitoring Suppl (ONE TOUCH ULTRA 2) w/Device KIT Use to check sugars daily. 1 each 0   fluticasone (FLONASE) 50 MCG/ACT nasal spray Place 1 spray into both nostrils daily.     glipiZIDE (GLUCOTROL XL) 5 MG 24 hr tablet TAKE 1 TABLET BEFORE BREAKFAST AND 1 TABLET BEFORE SUPPER 180 tablet 3   glucose blood (ONETOUCH ULTRA) test strip 1 each by Other route daily. E11.9 100 each 6   guaiFENesin (MUCINEX) 600 MG 12 hr tablet Take 1,200 mg by mouth 2 (two) times daily as needed for cough or to loosen phlegm.      ibuprofen (ADVIL,MOTRIN) 100 MG tablet Take 100 mg by mouth every 6 (six) hours as needed for pain.      Lancets (ONETOUCH ULTRASOFT) lancets Use as instructed check sugar one time daily. 100 each 12   metFORMIN (GLUCOPHAGE) 500 MG tablet Take 1 tablet (500 mg total) by mouth 2 (two) times daily with a meal. 180 tablet 3   methocarbamol (ROBAXIN) 500 MG tablet Take 1 tablet (500 mg total) by mouth 2 (two) times daily. 20 tablet 0   ondansetron (ZOFRAN ODT) 4 MG disintegrating tablet Take 1 tablet (4 mg total) by mouth every 8 (eight) hours as needed for nausea or vomiting. 20 tablet 0   oxyCODONE (ROXICODONE) 5 MG immediate release tablet Take 1 tablet (5 mg total) by mouth every 4 (four) hours as needed for severe pain. 15 tablet 0   triamcinolone cream (KENALOG) 0.1 % APPLY  CREAM EXTERNALLY TO AFFECTED AREA TWICE DAILY 45 g 0   No current facility-administered medications on file prior to visit.   Allergies  Allergen Reactions   Codeine Other (See Comments)     REACTION: nausea   Family History  Problem Relation Age of Onset   Snoring Father    Esophageal varices Father    Diabetes Paternal Grandmother    Heart disease Mother    Gallstones Mother    PE: BP 120/82 (BP Location: Right Arm, Patient Position: Sitting, Cuff Size: Normal)    Pulse 73    Ht 5' 7"  (1.702 m)    Wt 243 lb 6.4 oz (110.4 kg)    SpO2 96%  BMI 38.12 kg/m  Wt Readings from Last 3 Encounters:  09/21/21 243 lb 6.4 oz (110.4 kg)  12/25/20 260 lb 3.2 oz (118 kg)  12/31/19 255 lb (115.7 kg)   Constitutional: overweight, in NAD Eyes: PERRLA, EOMI, no exophthalmos ENT: moist mucous membranes, no thyromegaly, no cervical lymphadenopathy Cardiovascular: RRR, No MRG Respiratory: CTA B Musculoskeletal: no deformities, strength intact in all 4 Skin: moist, warm, + rash (NLD?) on medial side of left lower leg Neurological: no tremor with outstretched hands, DTR normal in all 4  ASSESSMENT: 1. DM2, non-insulin-dependent, uncontrolled, with complications -Mild CKD  2. HL  3. Obesity class 3 BMI Classification: < 18.5 underweight  18.5-24.9 normal weight  25.0-29.9 overweight  30.0-34.9 class I obesity  35.0-39.9 class II obesity  ? 40.0 class III obesity   PLAN:  1. Patient with previously fairly well-controlled type 2 diabetes, on metformin and sulfonylurea only.  He refused that she was not taking it with meals and GLP-1 receptor agonist in the past. -At last visit, sugars were higher especially as he was less mobile after his humeral fracture and also had many dietary indiscretions.  HbA1c was higher, at 7.9%.  At that time, he wanted to avoid SGLT2 inhibitors in the past to work on his diet. -At today's visit, sugars are very high.  They are mostly in the 200s to 300s range.  We discussed that he is very glucotoxicity now and losing weight (lost 17 pounds since last visit, most likely due to glucotoxicity), so we do need to start insulin.  He agrees with this.  I sent  Tyler Aas to his pharmacy and discussed that he can take this at any time of the day, however, if we need to change the formulary, other types of long-acting insulin are likely better taken at that time.  We will start with 16 units and increase as needed.  We will continue metformin and glipizide for now.  At next visit, we may try an SGLT2 inhibitor.  He agrees with this plan. - I suggested to:  Patient Instructions  Please continue: - Metformin 1000 mg at dinnertime - Glipizide ER 5 mg before b'fast  - Glipizide ER 5 mg before dinner (2 halves of a tablet)  Please start: - Tresiba U200 16 units daily. Increase by 2-4 units every 4 days until sugars in am <140 or you get to 36 units.   Please return in 1.5 months with your sugar log.  - we checked his HbA1c: 11.1% (MUCH higher) - advised to check sugars at different times of the day - 1x a day, rotating check times - advised for yearly eye exams >> he is UTD - return to clinic in 1.5 months    2. HL -Reviewed latest lipid panel from 12/2019: HDL slightly low, otherwise at goal: Lab Results  Component Value Date   CHOL 103 12/31/2019   HDL 29.40 (L) 12/31/2019   LDLCALC 43 12/31/2019   TRIG 152.0 (H) 12/31/2019   CHOLHDL 4 12/31/2019  -He continues on Lipitor 10 mg daily without side effects -He is due for another lipid panel -he will need to schedule another appointment with PCP. -If he does not have another lipid panel before next visit, we will check it then, when diabetes is better controlled  3.  Obesity class 3 -he refused SGLT2 inhibitors and GLP-1 receptor agonists in the past, which would have helped with weight loss -He gained 5 pounds before last visit -Lost 17 pounds since last  visit, most likely due to glucotoxicity  Philemon Kingdom, MD PhD [facility] Endocrinology

## 2021-09-27 ENCOUNTER — Encounter: Payer: Self-pay | Admitting: Internal Medicine

## 2021-10-23 ENCOUNTER — Encounter: Payer: Self-pay | Admitting: Emergency Medicine

## 2021-10-23 ENCOUNTER — Other Ambulatory Visit: Payer: Self-pay

## 2021-10-23 ENCOUNTER — Ambulatory Visit (INDEPENDENT_AMBULATORY_CARE_PROVIDER_SITE_OTHER): Payer: Medicare Other | Admitting: Emergency Medicine

## 2021-10-23 VITALS — BP 134/80 | HR 63 | Temp 98.3°F | Ht 67.0 in | Wt 247.0 lb

## 2021-10-23 DIAGNOSIS — Z1211 Encounter for screening for malignant neoplasm of colon: Secondary | ICD-10-CM

## 2021-10-23 DIAGNOSIS — Z13228 Encounter for screening for other metabolic disorders: Secondary | ICD-10-CM

## 2021-10-23 DIAGNOSIS — E1122 Type 2 diabetes mellitus with diabetic chronic kidney disease: Secondary | ICD-10-CM | POA: Diagnosis not present

## 2021-10-23 DIAGNOSIS — Z23 Encounter for immunization: Secondary | ICD-10-CM | POA: Diagnosis not present

## 2021-10-23 DIAGNOSIS — Z125 Encounter for screening for malignant neoplasm of prostate: Secondary | ICD-10-CM

## 2021-10-23 DIAGNOSIS — Z1329 Encounter for screening for other suspected endocrine disorder: Secondary | ICD-10-CM | POA: Diagnosis not present

## 2021-10-23 DIAGNOSIS — Z13220 Encounter for screening for lipoid disorders: Secondary | ICD-10-CM | POA: Diagnosis not present

## 2021-10-23 DIAGNOSIS — Z0000 Encounter for general adult medical examination without abnormal findings: Secondary | ICD-10-CM

## 2021-10-23 DIAGNOSIS — Z130 Encounter for screening for diseases of the blood and blood-forming organs and certain disorders involving the immune mechanism: Secondary | ICD-10-CM | POA: Diagnosis not present

## 2021-10-23 LAB — CBC WITH DIFFERENTIAL/PLATELET
Basophils Absolute: 0.1 10*3/uL (ref 0.0–0.1)
Basophils Relative: 0.7 % (ref 0.0–3.0)
Eosinophils Absolute: 0.1 10*3/uL (ref 0.0–0.7)
Eosinophils Relative: 1.1 % (ref 0.0–5.0)
HCT: 41.3 % (ref 39.0–52.0)
Hemoglobin: 14.1 g/dL (ref 13.0–17.0)
Lymphocytes Relative: 33.1 % (ref 12.0–46.0)
Lymphs Abs: 2.8 10*3/uL (ref 0.7–4.0)
MCHC: 34.1 g/dL (ref 30.0–36.0)
MCV: 94.5 fl (ref 78.0–100.0)
Monocytes Absolute: 0.7 10*3/uL (ref 0.1–1.0)
Monocytes Relative: 7.9 % (ref 3.0–12.0)
Neutro Abs: 4.8 10*3/uL (ref 1.4–7.7)
Neutrophils Relative %: 57.2 % (ref 43.0–77.0)
Platelets: 246 10*3/uL (ref 150.0–400.0)
RBC: 4.37 Mil/uL (ref 4.22–5.81)
RDW: 14 % (ref 11.5–15.5)
WBC: 8.4 10*3/uL (ref 4.0–10.5)

## 2021-10-23 LAB — COMPREHENSIVE METABOLIC PANEL
ALT: 20 U/L (ref 0–53)
AST: 23 U/L (ref 0–37)
Albumin: 4.5 g/dL (ref 3.5–5.2)
Alkaline Phosphatase: 59 U/L (ref 39–117)
BUN: 13 mg/dL (ref 6–23)
CO2: 28 mEq/L (ref 19–32)
Calcium: 9.9 mg/dL (ref 8.4–10.5)
Chloride: 102 mEq/L (ref 96–112)
Creatinine, Ser: 0.91 mg/dL (ref 0.40–1.50)
GFR: 84.89 mL/min (ref >60.00)
Glucose, Bld: 101 mg/dL — ABNORMAL HIGH (ref 70–99)
Potassium: 4.3 mEq/L (ref 3.5–5.1)
Sodium: 140 mEq/L (ref 135–145)
Total Bilirubin: 0.6 mg/dL (ref 0.2–1.2)
Total Protein: 7.2 g/dL (ref 6.0–8.3)

## 2021-10-23 LAB — PSA: PSA: 0.07 ng/mL — ABNORMAL LOW (ref 0.10–4.00)

## 2021-10-23 LAB — LIPID PANEL
Cholesterol: 116 mg/dL (ref 0–200)
HDL: 36.3 mg/dL — ABNORMAL LOW (ref >39.00)
LDL Cholesterol: 47 mg/dL (ref 0–99)
NonHDL: 80.16
Total CHOL/HDL Ratio: 3
Triglycerides: 167 mg/dL — ABNORMAL HIGH (ref 0.0–149.0)
VLDL: 33.4 mg/dL (ref 0.0–40.0)

## 2021-10-23 LAB — MICROALBUMIN / CREATININE URINE RATIO
Creatinine,U: 45.6 mg/dL
Microalb Creat Ratio: 1.5 mg/g (ref 0.0–30.0)
Microalb, Ur: <0.7 mg/dL (ref 0.0–1.9)

## 2021-10-23 NOTE — Progress Notes (Signed)
Rodney Turner. 71 y.o.   Chief Complaint  Patient presents with   Annual Exam    HISTORY OF PRESENT ILLNESS: This is a 71 y.o. male here for annual exam. No complaints or medical concerns today. Last colonoscopy 2009. Had recent eye examination. History of diabetes.  Recently saw Dr. Cruzita Lederer.  Office visit assessment and plan as follows: ASSESSMENT: 1. DM2, non-insulin-dependent, uncontrolled, with complications -Mild CKD   2. HL   3. Obesity class 3 BMI Classification: < 18.5 underweight  18.5-24.9 normal weight  25.0-29.9 overweight  30.0-34.9 class I obesity  35.0-39.9 class II obesity  ? 40.0 class III obesity    PLAN:  1. Patient with previously fairly well-controlled type 2 diabetes, on metformin and sulfonylurea only.  He refused that she was not taking it with meals and GLP-1 receptor agonist in the past. -At last visit, sugars were higher especially as he was less mobile after his humeral fracture and also had many dietary indiscretions.  HbA1c was higher, at 7.9%.  At that time, he wanted to avoid SGLT2 inhibitors in the past to work on his diet. -At today's visit, sugars are very high.  They are mostly in the 200s to 300s range.  We discussed that he is very glucotoxicity now and losing weight (lost 17 pounds since last visit, most likely due to glucotoxicity), so we do need to start insulin.  He agrees with this.  I sent Tyler Aas to his pharmacy and discussed that he can take this at any time of the day, however, if we need to change the formulary, other types of long-acting insulin are likely better taken at that time.  We will start with 16 units and increase as needed.  We will continue metformin and glipizide for now.  At next visit, we may try an SGLT2 inhibitor.  He agrees with this plan. - I suggested to:  Patient Instructions  Please continue: - Metformin 1000 mg at dinnertime - Glipizide ER 5 mg before b'fast  - Glipizide ER 5 mg before dinner (2  halves of a tablet)   Please start: - Tresiba U200 16 units daily. Increase by 2-4 units every 4 days until sugars in am <140 or you get to 36 units.   Please return in 1.5 months with your sugar log.   - we checked his HbA1c: 11.1% (MUCH higher) - advised to check sugars at different times of the day - 1x a day, rotating check times - advised for yearly eye exams >> he is UTD - return to clinic in 1.5 months                           2. HL -Reviewed latest lipid panel from 12/2019: HDL slightly low, otherwise at goal: Recent Labs       Lab Results  Component Value Date    CHOL 103 12/31/2019    HDL 29.40 (L) 12/31/2019    LDLCALC 43 12/31/2019    TRIG 152.0 (H) 12/31/2019    CHOLHDL 4 12/31/2019    -He continues on Lipitor 10 mg daily without side effects -He is due for another lipid panel -he will need to schedule another appointment with PCP. -If he does not have another lipid panel before next visit, we will check it then, when diabetes is better controlled   3.  Obesity class 3 -he refused SGLT2 inhibitors and GLP-1 receptor agonists in the past, which would have  helped with weight loss -He gained 5 pounds before last visit -Lost 17 pounds since last visit, most likely due to glucotoxicity   Philemon Kingdom, MD PhD [facility]      HPI   Prior to Admission medications   Medication Sig Start Date End Date Taking? Authorizing Provider  aspirin 81 MG tablet Take 81 mg by mouth daily.   Yes [provider]  atorvastatin (LIPITOR) 10 MG tablet TAKE 1 TABLET EVERY DAY AT 6PM 07/23/21  Yes Philemon Kingdom, MD  Blood Glucose Monitoring Suppl (ONE TOUCH ULTRA 2) w/Device KIT Use to check sugars daily. 06/03/16  Yes Philemon Kingdom, MD  fluticasone (FLONASE) 50 MCG/ACT nasal spray Place 1 spray into both nostrils daily.   Yes [provider]  glipiZIDE (GLUCOTROL XL) 5 MG 24 hr tablet TAKE 1 TABLET BEFORE BREAKFAST AND 1 TABLET BEFORE SUPPER  12/25/20  Yes Philemon Kingdom, MD  glucose blood (ONETOUCH ULTRA) test strip 1 each by Other route daily. E11.9 02/10/20  Yes Philemon Kingdom, MD  guaiFENesin (MUCINEX) 600 MG 12 hr tablet Take 1,200 mg by mouth 2 (two) times daily as needed for cough or to loosen phlegm.    Yes [provider]  ibuprofen (ADVIL,MOTRIN) 100 MG tablet Take 100 mg by mouth every 6 (six) hours as needed for pain.    Yes [provider]  insulin degludec (TRESIBA FLEXTOUCH) 200 UNIT/ML FlexTouch Pen Inject 16 Units into the skin daily. 09/21/21  Yes Philemon Kingdom, MD  Insulin Pen Needle 32G X 4 MM MISC Use 1x a day 09/21/21  Yes Philemon Kingdom, MD  Lancets [facility] ULTRASOFT) lancets Use as instructed check sugar one time daily. 04/03/17  Yes Philemon Kingdom, MD  metFORMIN (GLUCOPHAGE) 500 MG tablet Take 1 tablet (500 mg total) by mouth 2 (two) times daily with a meal. 12/25/20  Yes Philemon Kingdom, MD  triamcinolone cream (KENALOG) 0.1 % APPLY  CREAM EXTERNALLY TO AFFECTED AREA TWICE DAILY 06/25/20  Yes Jaylena Holloway, Ines Bloomer, MD  ondansetron (ZOFRAN ODT) 4 MG disintegrating tablet Take 1 tablet (4 mg total) by mouth every 8 (eight) hours as needed for nausea or vomiting. 11/26/20   Henderly, Britni A, PA-C  oxyCODONE (ROXICODONE) 5 MG immediate release tablet Take 1 tablet (5 mg total) by mouth every 4 (four) hours as needed for severe pain. 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  Optimal pressure 12cm on autotitration.     OSA on CPAP     Past Surgical History:  Procedure Laterality Date   KNEE SURGERY     left    Social History   Socioeconomic History   Marital status: Married    Spouse name: Not on file   Number of children: Not on file   Years of education: Not on file   Highest education level: Not on  file  Occupational History   Occupation: Press photographer  Tobacco Use   Smoking status: Never   Smokeless tobacco: Never  Substance and Sexual Activity   Alcohol use: No   Drug use: No   Sexual activity: Yes    Birth control/protection: None  Other Topics Concern   Not on file  Social History Narrative   Not on file   Social Determinants of Health   Financial Resource Strain: Not on file  Food Insecurity: Not on file  Transportation Needs: Not on file  Physical Activity: Not on file  Stress: Not on file  Social Connections: Not on file  Intimate Partner Violence: Not on file    Family History  Problem Relation Age of Onset   Snoring Father    Esophageal varices Father    Diabetes Paternal Grandmother    Heart disease Mother    Gallstones Mother      Review of Systems  Constitutional: Negative.  Negative for chills and fever.  HENT: Negative.  Negative for congestion and sore throat.   Respiratory: Negative.  Negative for cough and shortness of breath.   Cardiovascular: Negative.  Negative for chest pain and palpitations.  Gastrointestinal:  Negative for abdominal pain, nausea and vomiting.  Skin: Negative.  Negative for rash.  All other systems reviewed and are negative.  Today's Vitals   10/23/21 1423  BP: 134/80  Pulse: 63  Temp: 98.3 F (36.8 C)  TempSrc: Oral  SpO2: 96%  Weight: 247 lb (112 kg)  Height: 5' 7"  (1.702 m)   Body mass index is 38.69 kg/m.  Physical Exam Vitals reviewed.  Constitutional:      Appearance: Normal appearance. He is obese.   HENT:     Head: Normocephalic.     Right Ear: Tympanic membrane, ear canal and external ear normal.     Left Ear: Tympanic membrane, ear canal and external ear normal.     Mouth/Throat:     Mouth: Mucous membranes are moist.     Pharynx: Oropharynx is clear.  Eyes:     Extraocular Movements: Extraocular movements intact.     Conjunctiva/sclera: Conjunctivae normal.     Pupils: Pupils are equal, round, and reactive to light.  Cardiovascular:     Rate and Rhythm: Normal rate and regular rhythm.     Pulses: Normal pulses.     Heart sounds: Normal heart sounds.  Pulmonary:     Effort: Pulmonary effort is normal.     Breath sounds: Normal breath sounds.  Abdominal:     General: There is no distension.     Palpations: Abdomen is soft.     Tenderness: There is no abdominal tenderness.  Musculoskeletal:     Cervical back: No tenderness.  Lymphadenopathy:     Cervical: No cervical adenopathy.  Skin:    General: Skin is warm and dry.  Neurological:     General: No focal deficit present.     Mental Status: He is alert and oriented to person, place, and time.  Psychiatric:        Mood and Affect: Mood normal.        Behavior: Behavior normal.     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 ratio   Other Visit Diagnoses     Routine general medical examination at a health  care facility    -  Primary   Prostate cancer screening       Relevant Orders   PSA(Must document that pt has been informed of limitations of PSA testing.)   Colon cancer screening       Relevant Orders   Ambulatory referral to Gastroenterology   Screening for deficiency anemia       Relevant Orders   CBC with Differential   Screening for lipoid disorders       Relevant Orders   Lipid panel   Screening for endocrine, metabolic and immunity disorder        Relevant Orders   Comprehensive metabolic panel   Need for prophylactic vaccination against Streptococcus pneumoniae (pneumococcus)       Relevant Orders   Pneumococcal conjugate vaccine 20-valent (Prevnar 20)      Modifiable risk factors discussed with patient. Anticipatory guidance according to age provided. The following topics were also discussed: Social Determinants of Health Smoking.  Non-smoker Diet and nutrition and need to decrease amount of daily carbohydrate intake Diabetes management and medications Benefits of exercise Cancer screening and need for colon cancer screening with colonoscopy Colonoscopy report from 2009 reviewed with patient Vaccinations recommendations Cardiovascular risk assessment Mental health including depression and anxiety Fall and accident prevention  Patient Instructions  Health Maintenance, Male Adopting a healthy lifestyle and getting preventive care are important in promoting health and wellness. Ask your health care provider about: The right schedule for you to have regular tests and exams. Things you can do on your own to prevent diseases and keep yourself healthy. What should I know about diet, weight, and exercise? Eat a healthy diet  Eat a diet that includes plenty of vegetables, fruits, low-fat dairy products, and lean protein. Do not eat a lot of foods that are high in solid fats, added sugars, or sodium. Maintain a healthy weight Body mass index (BMI) is a measurement that can be used to identify possible weight problems. It estimates body fat based on height and weight. Your health care provider can help determine your BMI and help you achieve or maintain a healthy weight. Get regular exercise Get regular exercise. This is one of the most important things you can do for your health. Most adults should: Exercise for at least 150 minutes each week. The exercise should increase your heart rate and make you sweat (moderate-intensity  exercise). Do strengthening exercises at least twice a week. This is in addition to the moderate-intensity exercise. Spend less time sitting. Even light physical activity can be beneficial. Watch cholesterol and blood lipids Have your blood tested for lipids and cholesterol at 72 years of age, then have this test every 5 years. You may need to have your cholesterol levels checked more often if: Your lipid or cholesterol levels are high. You are older than 72 years of age. You are at high risk for heart disease. What should I know about cancer screening? Many types of cancers can be detected early and may often be prevented. Depending on your health history and family history, you may need to have cancer screening at various ages. This may include screening for: Colorectal cancer. Prostate cancer. Skin cancer. Lung cancer. What should I know about heart disease, diabetes, and high blood pressure? Blood pressure and heart disease High blood pressure causes heart disease and increases the risk of stroke. This is more likely to develop in people who have high blood pressure readings or are overweight. Talk with your health  care provider about your target blood pressure readings. Have your blood pressure checked: Every 3-5 years if you are 61-35 years of age. Every year if you are 70 years old or older. If you are between the ages of 72 and 27 and are a current or former smoker, ask your health care provider if you should have a one-time screening for abdominal aortic aneurysm (AAA). Diabetes Have regular diabetes screenings. This checks your fasting blood sugar level. Have the screening done: Once every three years after age 87 if you are at a normal weight and have a low risk for diabetes. More often and at a younger age if you are overweight or have a high risk for diabetes. What should I know about preventing infection? Hepatitis B If you have a higher risk for hepatitis B, you should be  screened for this virus. Talk with your health care provider to find out if you are at risk for hepatitis B infection. Hepatitis C Blood testing is recommended for: Everyone born from [birth year] through 1966. Anyone with known risk factors for hepatitis C. Sexually transmitted infections (STIs) You should be screened each year for STIs, including gonorrhea and chlamydia, if: You are sexually active and are younger than 72 years of age. You are older than 72 years of age and your health care provider tells you that you are at risk for this type of infection. Your sexual activity has changed since you were last screened, and you are at increased risk for chlamydia or gonorrhea. Ask your health care provider if you are at risk. Ask your health care provider about whether you are at high risk for HIV. Your health care provider may recommend a prescription medicine to help prevent HIV infection. If you choose to take medicine to prevent HIV, you should first get tested for HIV. You should then be tested every 3 months for as long as you are taking the medicine. Follow these instructions at home: Alcohol use Do not drink alcohol if your health care provider tells you not to drink. If you drink alcohol: Limit how much you have to 0-2 drinks a day. Know how much alcohol is in your drink. In the [place] one drink equals one 12 oz bottle of beer (355 mL), one 5 oz glass of wine (148 mL), or one 1 oz glass of hard liquor (44 mL). Lifestyle Do not use any products that contain nicotine or tobacco. These products include cigarettes, chewing tobacco, and vaping devices, such as e-cigarettes. If you need help quitting, ask your health care provider. Do not use street drugs. Do not share needles. Ask your health care provider for help if you need support or information about quitting drugs. General instructions Schedule regular health, dental, and eye exams. Stay current with your vaccines. Tell your health care  provider if: You often feel depressed. You have ever been abused or do not feel safe at home. Summary Adopting a healthy lifestyle and getting preventive care are important in promoting health and wellness. Follow your health care provider's instructions about healthy diet, exercising, and getting tested or screened for diseases. Follow your health care provider's instructions on monitoring your cholesterol and blood pressure. This information is not intended to replace advice given to you by your health care provider. Make sure you discuss any questions you have with your health care provider. Document Revised: 12/25/2020 Document Reviewed: 12/25/2020 Elsevier Patient Education  2022 [place], MD [facility]  at [facility]

## 2021-10-23 NOTE — Patient Instructions (Signed)

## 2021-11-05 ENCOUNTER — Ambulatory Visit (INDEPENDENT_AMBULATORY_CARE_PROVIDER_SITE_OTHER): Payer: Medicare Other | Admitting: Internal Medicine

## 2021-11-05 ENCOUNTER — Encounter: Payer: Self-pay | Admitting: Internal Medicine

## 2021-11-05 ENCOUNTER — Other Ambulatory Visit: Payer: Self-pay

## 2021-11-05 VITALS — BP 120/82 | HR 61 | Ht 67.0 in | Wt 240.7 lb

## 2021-11-05 DIAGNOSIS — E1122 Type 2 diabetes mellitus with diabetic chronic kidney disease: Secondary | ICD-10-CM

## 2021-11-05 DIAGNOSIS — E785 Hyperlipidemia, unspecified: Secondary | ICD-10-CM | POA: Diagnosis not present

## 2021-11-05 NOTE — Progress Notes (Signed)
Patient ID: Rodney Turner., male   DOB: [birth year], 71 y.o.   MRN: [id number]  ? ?This visit occurred during the SARS-CoV-2 public health emergency.  Safety protocols were in place, including screening questions prior to the visit, additional usage of staff PPE, and extensive cleaning of exam room while observing appropriate contact time as indicated for disinfecting solutions.  ? ?HPI: ?Rodney Turner. is a 71 y.o.-year-old male, initially referred by his PCP, Rodney Turner, presenting for f/u for DM2, dx in 03/2016, insulin-dependent, uncontrolled, with complications (mild CKD). His wife, Rodney Turner, is also my pt. last visit 1.5 months ago. ? ?Interim history: ?He fell and fractured his left humerus on 11/26/2020.  He was very active before last visit after this episode and sugars increase significantly.  However, since last visit, they improved after addition of insulin and he is feeling better. ?No increased urination, nausea, chest pain.  At last visit he had some blurry vision, which improved after that his blood sugars improved. ?He is walking up to 10,000 steps a day - 20 min outside with his [place] bulldog, and also walks on the treadmill. ? ?Reviewed history: ?In 01/2016, he started to feel very fatigued, frequent urination, nocturia, weight loss - saw PCP >> HbA1c very high (>14%). Prev. HbA1c levels were in the 6-7% range. ? ?Reviewed HbA1c levels: ?Lab Results  ?Component Value Date  ? HGBA1C 11.1 (A) 09/21/2021  ? HGBA1C 7.9 (A) 12/25/2020  ? HGBA1C 7.3 (A) 12/31/2019  ? HGBA1C 5.9 (A) 05/19/2018  ? HGBA1C 5.9 11/17/2017  ? HGBA1C 5.5 05/19/2017  ? HGBA1C 5.9 01/15/2017  ? HGBA1C 5.6 10/15/2016  ? HGBA1C 5.7 07/24/2016  ? HGBA1C >14.0 04/16/2016  ? HGBA1C 6.6 01/20/2015  ? HGBA1C 6.3 05/24/2014  ? ?Pt is on a regimen of: ?- Metformin 1000 mg at dinnertime ?- Glipizide ER 5 mg before b'fast  ?- Glipizide ER 5 mg before dinner (2 halves of a tablet) ?- Tresiba U200 16 >> 20 units daily - started  09/2021 ?He contacted me in 07/2019 with high blood sugars.  I suggested Ozempic but he did not start this as he did not want to use injectables.  He also refused to start [place]. ? ?Pt checks his sugars 1-2 times a day: ?- am:  150-170, 221 >> 190, 200-300 >> 116-165, 188 ?- 2h after b'fast: n/c ?- before lunch: 100-131 >> 115-137, 171 >> <150 >> n/c ?- 2h after lunch: 230 (double cheeseburger and fries) >> n/c  ?- before dinner: 95-125, 135 >> 100-150 >> 180s-200s >> 81-120 ?- 2h after dinner: n/c >> 120-159 >> n/c ?- bedtime: 149 >> <180 >> 175, 180 >> n/c ?- nighttime: n/c ?Lowest sugar was 88 >> 98 >> 103 >> 100 >> 180 >> 81; he has hypoglycemia awareness in the 70s. ?Highest sugar was 200s >> 210 >> 300s>> 200s. ? ?Pt's meals are: ?- Breakfast: [employer] >> burrito ?- Lunch: grilled chicken sandwich ?- Dinner: [employer]a or [employer] salad or meat + veggies + starch ?- Snacks: 1: chips, popcorn, fruit cups, fresh fruit ?He was previously going to the gym.  He has a treadmill. ? ?-+ Mild CKD. Last BUN/creatinine:  ?Lab Results  ?Component Value Date  ? BUN 13 10/23/2021  ? BUN 17 12/31/2019  ? CREATININE 0.91 10/23/2021  ? CREATININE 0.98 12/31/2019  ? ?Lab Results  ?Component Value Date  ? MICRALBCREAT 1.5 10/23/2021  ? MICRALBCREAT 0.8 12/31/2019  ? MICRALBCREAT 0.6 05/19/2017  ? ?-+  HL; last set of lipids: ?Lab Results  ?Component Value Date  ? CHOL 116 10/23/2021  ? HDL 36.30 (L) 10/23/2021  ? LDLCALC 47 10/23/2021  ? TRIG 167.0 (H) 10/23/2021  ? CHOLHDL 3 10/23/2021  ?On Lipitor 10. ? ?- last eye exam was in Fall 2022: No DR. + mild cataracts. Rodney Turner. ? ?-No numbness and tingling in his feet. ?On ASA. ? ?He also has OSA. ? ?He retired 05/2017. ? ?ROS: ?+ see HPI ? ?I reviewed pt's medications, allergies, PMH, social hx, family hx, and changes were documented in the history of present illness. Otherwise, unchanged from my initial visit note. ? ?Past Medical History:  ?Diagnosis Date  ? Allergic rhinitis    ? Diabetes mellitus ([place]) 05/24/2014  ? History of kidney stones 1990's  ? Morbid obesity ([place]) 03/17/2014  ? OBSTRUCTIVE SLEEP APNEA 02/22/2008  ? HST 2013:  AHI 51/hr.  Optimal pressure 12cm on autotitration.    ? OSA on CPAP   ? ?Past Surgical History:  ?Procedure Laterality Date  ? KNEE SURGERY    ? left  ? ?Social History  ? ?Social History  ? Marital status: Married  ?  Spouse name: N/A  ? Number of children: 2  ? 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 ?Social History Main Topics  ? Smoking status: Never Smoker  ? Smokeless tobacco: Never Used  ? Alcohol use No  ? Drug use: No  ? ?Current Outpatient Medications on File Prior to Visit  ?Medication Sig Dispense Refill  ? aspirin 81 MG tablet Take 81 mg by mouth daily.    ? atorvastatin (LIPITOR) 10 MG tablet TAKE 1 TABLET EVERY DAY AT 6PM 90 tablet 3  ? Blood Glucose Monitoring Suppl (ONE TOUCH ULTRA 2) w/Device KIT Use to check sugars daily. 1 each 0  ? fluticasone (FLONASE) 50 MCG/ACT nasal spray Place 1 spray into both nostrils daily.    ? glipiZIDE (GLUCOTROL XL) 5 MG 24 hr tablet TAKE 1 TABLET BEFORE BREAKFAST AND 1 TABLET BEFORE SUPPER 180 tablet 3  ? glucose blood (ONETOUCH ULTRA) test strip 1 each by Other route daily. E11.9 100 each 6  ? guaiFENesin (MUCINEX) 600 MG 12 hr tablet Take 1,200 mg by mouth 2 (two) times daily as needed for cough or to loosen phlegm.     ? ibuprofen (ADVIL,MOTRIN) 100 MG tablet Take 100 mg by mouth every 6 (six) hours as needed for pain.     ? insulin degludec (TRESIBA FLEXTOUCH) 200 UNIT/ML FlexTouch Pen Inject 16 Units into the skin daily. 9 mL 3  ? Insulin Pen Needle 32G X 4 MM MISC Use 1x a day 100 each 3  ? Lancets (ONETOUCH ULTRASOFT) lancets Use as instructed check sugar one time daily. 100 each 12  ? metFORMIN (GLUCOPHAGE) 500 MG tablet Take 1 tablet (500 mg total) by mouth 2 (two) times daily with a meal. 180 tablet 3  ? triamcinolone cream (KENALOG) 0.1 % APPLY  CREAM EXTERNALLY TO AFFECTED AREA TWICE DAILY 45 g 0  ? ?No  current facility-administered medications on file prior to visit.  ? ?Allergies  ?Allergen Reactions  ? Codeine Other (See Comments)  ?  REACTION: nausea  ? ?Family History  ?Problem Relation Age of Onset  ? Snoring Father   ? Esophageal varices Father   ? Diabetes Paternal Grandmother   ? Heart disease Mother   ? Gallstones Mother   ? ?PE: ?BP 120/82 (BP Location: Right Arm, Patient Position:  Sitting, Cuff Size: Normal)   Pulse 61   Ht _0  (1.702 m)   Wt 240 lb 11.2 oz (109.2 kg)   SpO2 96%   BMI 37.70 kg/m?  ?Wt Readings from Last 3 Encounters:  ?11/05/21 240 lb 11.2 oz (109.2 kg)  ?10/23/21 247 lb (112 kg)  ?09/21/21 243 lb 6.4 oz (110.4 kg)  ? ?Constitutional: overweight, in NAD ?Eyes: PERRLA, EOMI, no exophthalmos ?ENT: moist mucous membranes, no thyromegaly, no cervical lymphadenopathy ?Cardiovascular: RRR, No MRG ?Respiratory: CTA B ?Musculoskeletal: no deformities, strength intact in all 4 ?Skin: moist, warm, + rash (NLD?) on medial side of left lower leg ?Neurological: no tremor with outstretched hands, DTR normal in all 4 ?Diabetic Foot Exam - Simple   ?Simple Foot Form ?Diabetic Foot exam was performed with the following findings: Yes 11/05/2021  2:49 PM  ?Visual Inspection ?No deformities, no ulcerations, no other skin breakdown bilaterally: Yes ?Sensation Testing ?Intact to touch and monofilament testing bilaterally: Yes ?Pulse Check ?Posterior Tibialis and Dorsalis pulse intact bilaterally: Yes ?Comments ?Pitting perianle edema bilaterally - post. Tibialis pusles not palpable ?  ? ? ?ASSESSMENT: ?1. DM2, insulin-dependent, uncontrolled, with complications ?-Mild CKD ? ?2. HL ? ?3. Obesity class 3 ?BMI Classification: ?< 18.5 underweight  ?18.5-24.9 normal weight  ?25.0-29.9 overweight  ?30.0-34.9 class I obesity  ?35.0-39.9 class II obesity  ?? 40.0 class III obesity  ? ?PLAN:  ?1. Patient with previously fairly well-controlled type 2 diabetes on metformin and sulfonylurea only, who returns at  last visit after long absence of 9 months, with very poor control.  The majority of the blood sugars are in the 200s to 300s and his HbA1c was 11.1%, much increased from the previous 7.9%.  We added long-a

## 2021-11-05 NOTE — Patient Instructions (Addendum)
Please continue: ?- Metformin 1000 mg at dinnertime ?- Glipizide ER 5 mg before b'fast  ?- Glipizide ER 5 mg before dinner (2 halves of a tablet) ?- Tresiba U200 20 units daily ?  ?Please return in 3 months with your sugar log. ?

## 2021-11-19 ENCOUNTER — Encounter: Payer: Self-pay | Admitting: Internal Medicine

## 2021-11-19 DIAGNOSIS — E1122 Type 2 diabetes mellitus with diabetic chronic kidney disease: Secondary | ICD-10-CM

## 2021-11-19 MED ORDER — ONETOUCH ULTRASOFT LANCETS MISC: 6 refills | Status: DC

## 2021-11-19 MED ORDER — ONETOUCH ULTRA VI STRP: 1.0000 | ORAL_STRIP | Freq: Every day | 6 refills | Status: DC

## 2021-11-21 MED ORDER — ONETOUCH ULTRA VI STRP: 1.0000 | ORAL_STRIP | Freq: Every day | 6 refills | Status: DC

## 2021-11-21 MED ORDER — ONETOUCH ULTRASOFT LANCETS MISC: 6 refills | Status: AC

## 2022-01-01 ENCOUNTER — Ambulatory Visit (INDEPENDENT_AMBULATORY_CARE_PROVIDER_SITE_OTHER): Payer: Medicare Other

## 2022-01-01 DIAGNOSIS — Z0000 Encounter for general adult medical examination without abnormal findings: Secondary | ICD-10-CM

## 2022-01-01 NOTE — Patient Instructions (Addendum)
[name] , ?Thank you for taking time to come for your Medicare Wellness Visit. I appreciate your ongoing commitment to your health goals. Please review the following plan we discussed and let me know if I can assist you in the future.  ? ?Screening recommendations/referrals: ?Colonoscopy: 03/04/2008; due every 10 years (scheduling for 03/2022) ?Recommended yearly ophthalmology/optometry visit for glaucoma screening and checkup ?Recommended yearly dental visit for hygiene and checkup ? ?Vaccinations: ?Influenza vaccine: 06/26/2021 ?Pneumococcal vaccine: 10/23/2021 ?Tdap vaccine: due ?Shingles vaccine: due   ?Covid-19: 10/10/2019, 12/21/2019, 06/26/2021 ? ?Advanced directives: No ? ?Conditions/risks identified: Yes; Type II Diabetes  ? ?Next appointment: 01/03/2023 at 9:45 a.m. telephone visit with Rodney Turner, Nurse Health Advisor.  If you need to reschedule or cancel, please call [phone]. ? ?Preventive Care 72 Years and Older, Male ?Preventive care refers to lifestyle choices and visits with your health care provider that can promote health and wellness. ?What does preventive care include? ?A yearly physical exam. This is also called an annual well check. ?Dental exams once or twice a year. ?Routine eye exams. Ask your health care provider how often you should have your eyes checked. ?Personal lifestyle choices, including: ?Daily care of your teeth and gums. ?Regular physical activity. ?Eating a healthy diet. ?Avoiding tobacco and drug use. ?Limiting alcohol use. ?Practicing safe sex. ?Taking low doses of aspirin every day. ?Taking vitamin and mineral supplements as recommended by your health care provider. ?What happens during an annual well check? ?The services and screenings done by your health care provider during your annual well check will depend on your age, overall health, lifestyle risk factors, and family history of disease. ?Counseling  ?Your health care provider may ask you questions about your: ?Alcohol  use. ?Tobacco use. ?Drug use. ?Emotional well-being. ?Home and relationship well-being. ?Sexual activity. ?Eating habits. ?History of falls. ?Memory and ability to understand (cognition). ?Work and work Statistician. ?Screening  ?You may have the following tests or measurements: ?Height, weight, and BMI. ?Blood pressure. ?Lipid and cholesterol levels. These may be checked every 5 years, or more frequently if you are over 72 years old. ?Skin check. ?Lung cancer screening. You may have this screening every year starting at age 72 if you have a 30-pack-year history of smoking and currently smoke or have quit within the past 15 years. ?Fecal occult blood test (FOBT) of the stool. You may have this test every year starting at age 72. ?Flexible sigmoidoscopy or colonoscopy. You may have a sigmoidoscopy every 5 years or a colonoscopy every 10 years starting at age 72. ?Prostate cancer screening. Recommendations will vary depending on your family history and other risks. ?Hepatitis C blood test. ?Hepatitis B blood test. ?Sexually transmitted disease (STD) testing. ?Diabetes screening. This is done by checking your blood sugar (glucose) after you have not eaten for a while (fasting). You may have this done every 1-3 years. ?Abdominal aortic aneurysm (AAA) screening. You may need this if you are a current or former smoker. ?Osteoporosis. You may be screened starting at age 72 if you are at high risk. ?Talk with your health care provider about your test results, treatment options, and if necessary, the need for more tests. ?Vaccines  ?Your health care provider may recommend certain vaccines, such as: ?Influenza vaccine. This is recommended every year. ?Tetanus, diphtheria, and acellular pertussis (Tdap, Td) vaccine. You may need a Td booster every 10 years. ?Zoster vaccine. You may need this after age 37. ?Pneumococcal 13-valent conjugate (PCV13) vaccine. One dose is recommended after age  72. ?Pneumococcal polysaccharide  (PPSV23) vaccine. One dose is recommended after age 72. ?Talk to your health care provider about which screenings and vaccines you need and how often you need them. ?This information is not intended to replace advice given to you by your health care provider. Make sure you discuss any questions you have with your health care provider. ?Document Released: 09/01/2015 Document Revised: 04/24/2016 Document Reviewed: 06/06/2015 ?Elsevier Interactive Patient Education ? 2017 [place]. ? ?Fall Prevention in the Home ?Falls can cause injuries. They can happen to people of all ages. There are many things you can do to make your home safe and to help prevent falls. ?What can I do on the outside of my home? ?Regularly fix the edges of walkways and driveways and fix any cracks. ?Remove anything that might make you trip as you walk through a door, such as a raised step or threshold. ?Trim any bushes or trees on the path to your home. ?Use bright outdoor lighting. ?Clear any walking paths of anything that might make someone trip, such as rocks or tools. ?Regularly check to see if handrails are loose or broken. Make sure that both sides of any steps have handrails. ?Any raised decks and porches should have guardrails on the edges. ?Have any leaves, snow, or ice cleared regularly. ?Use sand or salt on walking paths during winter. ?Clean up any spills in your garage right away. This includes oil or grease spills. ?What can I do in the bathroom? ?Use night lights. ?Install grab bars by the toilet and in the tub and shower. Do not use towel bars as grab bars. ?Use non-skid mats or decals in the tub or shower. ?If you need to sit down in the shower, use a plastic, non-slip stool. ?Keep the floor dry. Clean up any water that spills on the floor as soon as it happens. ?Remove soap buildup in the tub or shower regularly. ?Attach bath mats securely with double-sided non-slip rug tape. ?Do not have throw rugs and other things on the  floor that can make you trip. ?What can I do in the bedroom? ?Use night lights. ?Make sure that you have a light by your bed that is easy to reach. ?Do not use any sheets or blankets that are too big for your bed. They should not hang down onto the floor. ?Have a firm chair that has side arms. You can use this for support while you get dressed. ?Do not have throw rugs and other things on the floor that can make you trip. ?What can I do in the kitchen? ?Clean up any spills right away. ?Avoid walking on wet floors. ?Keep items that you use a lot in easy-to-reach places. ?If you need to reach something above you, use a strong step stool that has a grab bar. ?Keep electrical cords out of the way. ?Do not use floor polish or wax that makes floors slippery. If you must use wax, use non-skid floor wax. ?Do not have throw rugs and other things on the floor that can make you trip. ?What can I do with my stairs? ?Do not leave any items on the stairs. ?Make sure that there are handrails on both sides of the stairs and use them. Fix handrails that are broken or loose. Make sure that handrails are as long as the stairways. ?Check any carpeting to make sure that it is firmly attached to the stairs. Fix any carpet that is loose or worn. ?Avoid having throw rugs at  the top or bottom of the stairs. If you do have throw rugs, attach them to the floor with carpet tape. ?Make sure that you have a light switch at the top of the stairs and the bottom of the stairs. If you do not have them, ask someone to add them for you. ?What else can I do to help prevent falls? ?Wear shoes that: ?Do not have high heels. ?Have rubber bottoms. ?Are comfortable and fit you well. ?Are closed at the toe. Do not wear sandals. ?If you use a stepladder: ?Make sure that it is fully opened. Do not climb a closed stepladder. ?Make sure that both sides of the stepladder are locked into place. ?Ask someone to hold it for you, if possible. ?Clearly mark and make  sure that you can see: ?Any grab bars or handrails. ?First and last steps. ?Where the edge of each step is. ?Use tools that help you move around (mobility aids) if they are needed. These include: ?Canes. ?Walkers.

## 2022-01-01 NOTE — Progress Notes (Signed)
?I connected with Rodney Turner. today by telephone and verified that I am speaking with the correct person using two identifiers. ?Location patient: home ?Location provider: work ?Persons participating in the virtual visit: patient, provider. ?  ?I discussed the limitations, risks, security and privacy concerns of performing an evaluation and management service by telephone and the availability of in person appointments. I also discussed with the patient that there may be a patient responsible charge related to this service. The patient expressed understanding and verbally consented to this telephonic visit.  ?  ?Interactive audio and video telecommunications were attempted between this provider and patient, however failed, due to patient having technical difficulties OR patient did not have access to video capability.  We continued and completed visit with audio only. ? ?Some vital signs may be absent or patient reported.  ? ?Time Spent with patient on telephone encounter: 30 minutes ? ?Subjective:  ? Rodney Turner. is a 71 y.o. male who presents for Medicare Annual/Subsequent preventive examination. ? ?Review of Systems    ? ?Cardiac Risk Factors include: advanced age (>33mn, >>84women);diabetes mellitus;dyslipidemia;family history of premature cardiovascular disease;male gender;obesity (BMI >30kg/m2) ? ?   ?Objective:  ?  ?There were no vitals filed for this visit. ?There is no height or weight on file to calculate BMI. ? ? ?  01/01/2022  ?  9:24 AM 10/25/2019  ? 10:34 AM  ?Advanced Directives  ?Does Patient Have a Medical Advance Directive? No No  ?Would patient like information on creating a medical advance directive? No - Patient declined Yes (ED - Information included in AVS)  ? ? ?Current Medications (verified) ?Outpatient Encounter Medications as of 01/01/2022  ?Medication Sig  ? aspirin 81 MG tablet Take 81 mg by mouth daily.  ? atorvastatin (LIPITOR) 10 MG tablet TAKE 1 TABLET EVERY DAY AT 6PM  ?  Blood Glucose Monitoring Suppl (ONE TOUCH ULTRA 2) w/Device KIT Use to check sugars daily.  ? fluticasone (FLONASE) 50 MCG/ACT nasal spray Place 1 spray into both nostrils daily.  ? glipiZIDE (GLUCOTROL XL) 5 MG 24 hr tablet TAKE 1 TABLET BEFORE BREAKFAST AND 1 TABLET BEFORE SUPPER  ? glucose blood (ONETOUCH ULTRA) test strip 1 each by Other route daily. E11.9  ? guaiFENesin (MUCINEX) 600 MG 12 hr tablet Take 1,200 mg by mouth 2 (two) times daily as needed for cough or to loosen phlegm.   ? ibuprofen (ADVIL,MOTRIN) 100 MG tablet Take 100 mg by mouth every 6 (six) hours as needed for pain.   ? insulin degludec (TRESIBA FLEXTOUCH) 200 UNIT/ML FlexTouch Pen Inject 16 Units into the skin daily. (Patient taking differently: Inject 16 Units into the skin daily. Patient stated he is taking 14 units for the last 3 weeks.)  ? Insulin Pen Needle 32G X 4 MM MISC Use 1x a day  ? Lancets (ONETOUCH ULTRASOFT) lancets Use as instructed check sugar one time daily.  ? metFORMIN (GLUCOPHAGE) 500 MG tablet Take 1 tablet (500 mg total) by mouth 2 (two) times daily with a meal.  ? triamcinolone cream (KENALOG) 0.1 % APPLY  CREAM EXTERNALLY TO AFFECTED AREA TWICE DAILY  ? ?No facility-administered encounter medications on file as of 01/01/2022.  ? ? ?Allergies (verified) ?Codeine  ? ?History: ?Past Medical History:  ?Diagnosis Date  ? Allergic rhinitis   ? Diabetes mellitus ([facility] 05/24/2014  ? History of kidney stones 1990's  ? Morbid obesity ([facility] 03/17/2014  ? OBSTRUCTIVE SLEEP APNEA 02/22/2008  ? HST 2013:  AHI 51/hr.  Optimal pressure 12cm on autotitration.    ? OSA on CPAP   ? ?Past Surgical History:  ?Procedure Laterality Date  ? KNEE SURGERY    ? left  ? ?Family History  ?Problem Relation Age of Onset  ? Snoring Father   ? Esophageal varices Father   ? Diabetes Paternal Grandmother   ? Heart disease Mother   ? Gallstones Mother   ? ?Social History  ? ?Socioeconomic History  ? Marital status: Married  ?  Spouse name: Not on file  ?  Number of children: Not on file  ? Years of education: Not on file  ? Highest education level: Not on file  ?Occupational History  ? Occupation: Press photographer  ?Tobacco Use  ? Smoking status: Never  ? Smokeless tobacco: Never  ?Substance and Sexual Activity  ? Alcohol use: No  ? Drug use: No  ? Sexual activity: Yes  ?  Birth control/protection: None  ?Other Topics Concern  ? Not on file  ?Social History Narrative  ? Not on file  ? ?Social Determinants of Health  ? ?Financial Resource Strain: Low Risk   ? Difficulty of Paying Living Expenses: Not hard at all  ?Food Insecurity: No Food Insecurity  ? Worried About Charity fundraiser in the Last Year: Never true  ? Ran Out of Food in the Last Year: Never true  ?Transportation Needs: No Transportation Needs  ? Lack of Transportation (Medical): No  ? Lack of Transportation (Non-Medical): No  ?Physical Activity: Sufficiently Active  ? Days of Exercise per Week: 7 days  ? Minutes of Exercise per Session: 30 min  ?Stress: No Stress Concern Present  ? Feeling of Stress : Not at all  ?Social Connections: Socially Integrated  ? Frequency of Communication with Friends and Family: More than three times a week  ? Frequency of Social Gatherings with Friends and Family: More than three times a week  ? Attends Religious Services: More than 4 times per year  ? Active Member of Clubs or Organizations: Yes  ? Attends Archivist Meetings: More than 4 times per year  ? Marital Status: Married  ? ? ?Tobacco Counseling ?Counseling given: Not Answered ? ? ?Clinical Intake: ? ?Pre-visit preparation completed: Yes ? ?Pain : No/denies pain ? ?  ? ?BMI - recorded: 37.7 ?Nutritional Risks: None ?Diabetes: Yes ?CBG done?: No ?Did pt. bring in CBG monitor from home?: No ? ?How often do you need to have someone help you when you read instructions, pamphlets, or other written materials from your doctor or pharmacy?: 1 - Never ?What is the last grade level you completed in school?: Bachelor's  Degree ? ?Diabetic? yes ? ?Interpreter Needed?: No ? ?Information entered by :: Lisette Abu, LPN. ? ? ?Activities of Daily Living ? ?  01/01/2022  ?  9:28 AM  ?In your present state of health, do you have any difficulty performing the following activities:  ?Hearing? 0  ?Vision? 0  ?Difficulty concentrating or making decisions? 0  ?Walking or climbing stairs? 0  ?Dressing or bathing? 0  ?Doing errands, shopping? 0  ?Preparing Food and eating ? N  ?Using the Toilet? N  ?In the past six months, have you accidently leaked urine? N  ?Do you have problems with loss of bowel control? N  ?Managing your Medications? N  ?Managing your Finances? N  ?Housekeeping or managing your Housekeeping? N  ? ? ?Patient Care Team: ?Horald Pollen, MD as PCP -  General (Internal Medicine) ?Marica Otter, OD as Consulting Physician (Optometry) ? ?Indicate any recent Medical Services you may have received from other than [facility] providers in the past year (date may be approximate). ? ?   ?Assessment:  ? This is a routine wellness examination for Jerret. ? ?Hearing/Vision screen ?Hearing Screening - Comments:: Patient denied any hearing difficulty.   ?No hearing aids. ? ?Vision Screening - Comments:: Patient does wear corrective lenses/contacts.  ?Eye exam done by: [facility] (October 2022) ? ? ?Dietary issues and exercise activities discussed: ?Current Exercise Habits: Home exercise routine, Type of exercise: walking (Goal: 10,000 steps per day), Time (Minutes): 30, Frequency (Times/Week): 7, Weekly Exercise (Minutes/Week): 210, Intensity: Moderate, Exercise limited by: None identified ? ? Goals Addressed   ? ?  ?  ?  ?  ? This Visit's Progress  ?  My goal is to lose weight by staying physically active and to get off some of these diabetic medications/injections.     ?  Weight down 14 pounds. ?HgA1C down to 5-6.0 goal  ?  ? ?  ?Depression Screen ? ?  01/01/2022  ?  9:27 AM 10/23/2021  ?  2:05 PM 11/05/2019  ? 12:46 PM 10/25/2019  ? 10:37  AM 09/27/2019  ?  9:29 AM 09/19/2018  ? 11:27 AM 09/12/2017  ?  2:50 PM  ?PHQ 2/9 Scores  ?PHQ - 2 Score 0 0 0 0 0 0 0  ?  ?Fall Risk ? ?  01/01/2022  ?  9:26 AM 10/23/2021  ?  2:23 PM 10/23/2021  ?  2:05 PM 11/05/2019

## 2022-01-17 ENCOUNTER — Encounter: Payer: Self-pay | Admitting: Gastroenterology

## 2022-02-08 ENCOUNTER — Ambulatory Visit (AMBULATORY_SURGERY_CENTER): Payer: Self-pay | Admitting: *Deleted

## 2022-02-08 ENCOUNTER — Other Ambulatory Visit: Payer: Self-pay | Admitting: Internal Medicine

## 2022-02-08 VITALS — Ht 67.0 in | Wt 242.0 lb

## 2022-02-08 DIAGNOSIS — Z1211 Encounter for screening for malignant neoplasm of colon: Secondary | ICD-10-CM

## 2022-02-08 MED ORDER — NA SULFATE-K SULFATE-MG SULF 17.5-3.13-1.6 GM/177ML PO SOLN: 1.0000 | Freq: Once | ORAL | 0 refills | Status: AC

## 2022-02-15 ENCOUNTER — Other Ambulatory Visit: Payer: Self-pay | Admitting: Internal Medicine

## 2022-03-14 ENCOUNTER — Encounter: Payer: Self-pay | Admitting: Internal Medicine

## 2022-03-14 ENCOUNTER — Ambulatory Visit: Payer: Medicare Other | Admitting: Internal Medicine

## 2022-03-14 VITALS — BP 128/80 | HR 59 | Ht 67.0 in | Wt 242.6 lb

## 2022-03-14 DIAGNOSIS — E785 Hyperlipidemia, unspecified: Secondary | ICD-10-CM

## 2022-03-14 DIAGNOSIS — E1122 Type 2 diabetes mellitus with diabetic chronic kidney disease: Secondary | ICD-10-CM

## 2022-03-14 DIAGNOSIS — N189 Chronic kidney disease, unspecified: Secondary | ICD-10-CM

## 2022-03-14 LAB — POCT GLYCOSYLATED HEMOGLOBIN (HGB A1C): Hemoglobin A1C: 6.1 % — AB (ref 4.0–5.6)

## 2022-03-14 NOTE — Patient Instructions (Addendum)
Please continue: - Metformin 1000 mg at dinnertime - Glipizide ER 5 mg before b'fast  - Glipizide ER 5 mg before dinner (2 halves of a tablet) - Tresiba U200 18 units daily   Please return in 3-4 months with your sugar log.

## 2022-03-14 NOTE — Progress Notes (Signed)
Patient ID: Rodney Turner., male   DOB: Mar 06, 1951, 71 y.o.   MRN: [id number]   HPI: Rodney Turner. is a 71 y.o.-year-old male, initially referred by his PCP, Dr. Everlene Farrier, presenting for f/u for DM2, dx in 03/2016, insulin-dependent, uncontrolled, with complications (mild CKD). His wife, Rodney Turner, is also my pt. Last visit 4 months ago.  Interim history: No increased urination, nausea, chest pain.   He is walking outside - less now 2/2 heat. He just returned from [place] - was there for 3 weeks.  Reviewed history: In 01/2016, he started to feel very fatigued, frequent urination, nocturia, weight loss - saw PCP >> HbA1c very high (>14%). Prev. HbA1c levels were in the 6-7% range.  Reviewed HbA1c levels: Lab Results  Component Value Date   HGBA1C 11.1 (A) 09/21/2021   HGBA1C 7.9 (A) 12/25/2020   HGBA1C 7.3 (A) 12/31/2019   HGBA1C 5.9 (A) 05/19/2018   HGBA1C 5.9 11/17/2017   HGBA1C 5.5 05/19/2017   HGBA1C 5.9 01/15/2017   HGBA1C 5.6 10/15/2016   HGBA1C 5.7 07/24/2016   HGBA1C >14.0 04/16/2016   HGBA1C 6.6 01/20/2015   HGBA1C 6.3 05/24/2014   Pt is on a regimen of: - Metformin 1000 mg at dinnertime - Glipizide ER 5 mg before b'fast  - Glipizide ER 5 mg before dinner (2 halves of a tablet) - Tresiba U200 16 >> 20 >> 14-18 units daily - started 09/2021 He contacted me in 07/2019 with high blood sugars.  I suggested Ozempic but he did not start this as he did not want to use injectables.  He also refused to start [place].  Pt checks his sugars 1-2 times a day: - am:  190, 200-300 >> 116-165, 188 >> 123-154, 178, 190 - 2h after b'fast: n/c - before lunch: 100-131 >> 115-137, 171 >> <150 >> n/c - 2h after lunch: 230 (double cheeseburger and fries) >> n/c  - before dinner: 100-150 >> 180s-200s >> 81-120 >> 89-150 - 2h after dinner: n/c >> 120-159 >> n/c - bedtime: 149 >> <180 >> 175, 180 >> n/c - nighttime: n/c Lowest sugar was100 >> 180 >> 81 >> 89; he has hypoglycemia  awareness in the 70s. Highest sugar was 300s >> 200s >> 190.  Pt's meals are: - Breakfast: McD[employer] >> burrito - Lunch: grilled chicken sandwich - Dinner: [employer]fil[employer]a or [employer]'s salad or meat + veggies + starch - Snacks: 1: chips, popcorn, fruit cups, fresh fruit >> cough drops, pork rinds He was previously going to the gym.  He has a treadmill.  -+ Mild CKD. Last BUN/creatinine:  Lab Results  Component Value Date   BUN 13 10/23/2021   BUN 17 12/31/2019   CREATININE 0.91 10/23/2021   CREATININE 0.98 12/31/2019   Lab Results  Component Value Date   MICRALBCREAT 1.5 10/23/2021   MICRALBCREAT 0.8 12/31/2019   MICRALBCREAT 0.6 05/19/2017   -+ HL; last set of lipids: Lab Results  Component Value Date   CHOL 116 10/23/2021   HDL 36.30 (L) 10/23/2021   LDLCALC 47 10/23/2021   TRIG 167.0 (H) 10/23/2021   CHOLHDL 3 10/23/2021  On Lipitor 10.  - last eye exam was in 05/2021: No DR. + mild cataracts. Dr. Sabra Heck.  -No numbness and tingling in his feet.  Last foot exam 11/05/2021.  He also has OSA. He fell and fractured his left humerus on 11/26/2020.  He was very active before last visit but stopped after his fracture after this episode and sugars  increased significantly.   He retired 05/2017.  ROS: + see HPI  I reviewed pt's medications, allergies, PMH, social hx, family hx, and changes were documented in the history of present illness. Otherwise, unchanged from my initial visit note.  Past Medical History:  Diagnosis Date   Allergic rhinitis    Allergy    Cataract    forming   Diabetes mellitus ([place]) 05/24/2014   History of kidney stones 04/19/1989   Hyperlipidemia    Morbid obesity ([place]) 03/17/2014   OBSTRUCTIVE SLEEP APNEA 02/22/2008   HST 2013:  AHI 51/hr.  Optimal pressure 12cm on autotitration.     OSA on CPAP    Sleep apnea    on cpap   Past Surgical History:  Procedure Laterality Date   COLONOSCOPY  2009   KNEE SURGERY     left   Social History    Social History   Marital status: Married    Spouse name: N/A   Number of children: 2   Occupational History   sales    Social History Main Topics   Smoking status: Never Smoker   Smokeless tobacco: Never Used   Alcohol use No   Drug use: No   Current Outpatient Medications on File Prior to Visit  Medication Sig Dispense Refill   acetaminophen (TYLENOL) 650 MG CR tablet Take 650 mg by mouth every 8 (eight) hours as needed for pain.     aspirin 81 MG tablet Take 81 mg by mouth daily.     atorvastatin (LIPITOR) 10 MG tablet TAKE 1 TABLET EVERY DAY AT 6PM 90 tablet 3   Blood Glucose Monitoring Suppl (ONE TOUCH ULTRA 2) w/Device KIT Use to check sugars daily. 1 each 0   fluticasone (FLONASE) 50 MCG/ACT nasal spray Place 1 spray into both nostrils daily. (Patient not taking: Reported on 02/08/2022)     glipiZIDE (GLUCOTROL XL) 5 MG 24 hr tablet TAKE 1 TABLET BEFORE BREAKFAST AND 1 TABLET BEFORE SUPPER 180 tablet 3   glucose blood (ONETOUCH ULTRA) test strip 1 each by Other route daily. E11.9 100 each 6   guaiFENesin (MUCINEX) 600 MG 12 hr tablet Take 1,200 mg by mouth 2 (two) times daily as needed for cough or to loosen phlegm.      ibuprofen (ADVIL,MOTRIN) 100 MG tablet Take 100 mg by mouth every 6 (six) hours as needed for pain.      insulin degludec (TRESIBA FLEXTOUCH) 200 UNIT/ML FlexTouch Pen Inject 16 Units into the skin daily. (Patient taking differently: Inject 16 Units into the skin daily. Patient stated he is taking 14 units for the last 3 weeks.) 9 mL 3   Insulin Pen Needle 32G X 4 MM MISC Use 1x a day 100 each 3   Lancets (ONETOUCH ULTRASOFT) lancets Use as instructed check sugar one time daily. 100 each 6   metFORMIN (GLUCOPHAGE) 500 MG tablet TAKE 1 TABLET BY MOUTH 2 TIMES DAILY WITH A MEAL. 180 tablet 3   triamcinolone cream (KENALOG) 0.1 % APPLY  CREAM EXTERNALLY TO AFFECTED AREA TWICE DAILY 45 g 0   No current facility-administered medications on file prior to visit.    Allergies  Allergen Reactions   Codeine Other (See Comments)    REACTION: nausea   Family History  Problem Relation Age of Onset   Heart disease Mother    Gallstones Mother    Snoring Father    Esophageal varices Father    Diabetes Paternal Grandmother    Colon cancer Neg  Hx    Colon polyps Neg Hx    Esophageal cancer Neg Hx    Rectal cancer Neg Hx    Stomach cancer Neg Hx    PE: BP 128/80 (BP Location: Left Arm, Patient Position: Sitting, Cuff Size: Normal)   Pulse (!) 59   Ht _0  (1.702 m)   Wt 242 lb 9.6 oz (110 kg)   SpO2 96%   BMI 38.00 kg/m  Wt Readings from Last 3 Encounters:  03/14/22 242 lb 9.6 oz (110 kg)  02/08/22 242 lb (109.8 kg)  11/05/21 240 lb 11.2 oz (109.2 kg)   Constitutional: overweight, in NAD Eyes: EOMI, no exophthalmos ENT: moist mucous membranes, no thyromegaly, no cervical lymphadenopathy Cardiovascular: RRR, No MRG Respiratory: CTA B Musculoskeletal: no deformities Skin: moist, warm, + rash (NLD?) on medial side of left lower leg Neurological: no tremor with outstretched hands   ASSESSMENT: 1. DM2, insulin-dependent, uncontrolled, with complications -Mild CKD  2. HL  3. Obesity class 3 BMI Classification: < 18.5 underweight  18.5-24.9 normal weight  25.0-29.9 overweight  30.0-34.9 class I obesity  35.0-39.9 class II obesity  ? 40.0 class III obesity   PLAN:  1. Patient with previously fairly well-controlled type 2 diabetes on metformin and sulfonylurea only, but with deteriorating blood sugar control when he returned after long absence in 09/2021.  HbA1c was 11.1%, much higher.  We added long-acting insulin.  At last visit sugars are much improved, at or above target in the morning and entirely at target before dinner.  He was working on reducing larger dinners.  He was also more active, building up to 10,000 steps a day on the treadmill and outside.  We did not change his regimen at that time.  -At today's visit, his blood  sugars appear to be well controlled later in the day, but they are still at or slightly above target in the morning.  This is possibly related to his recent trip to [place] but also to the fact that he was reducing the dose of Tresiba, taking 14 units, then 16 units, and, for the last week, 18 units.  He has the blood sugars on 18 units so for now we will continue this dose. -He has been less active recently due to the heat, but we discussed about using the treadmill. -No other changes in his regimen. - I suggested to:  Patient Instructions  Please continue: - Metformin 1000 mg at dinnertime - Glipizide ER 5 mg before b'fast  - Glipizide ER 5 mg before dinner (2 halves of a tablet) - Tresiba U200 20 units daily   Please return in 3 months with your sugar log.  - HbA1c today is spectacularly improved: 6.1%. - advised to check sugars at different times of the day - 2x a day, rotating check times - advised for yearly eye exams >> he is UTD - return to clinic in 3-4 months  2. HL -Reviewed latest lipid panel from 10/2021: LDL at goal, triglycerides slightly high, HDL slightly low: Lab Results  Component Value Date   CHOL 116 10/23/2021   HDL 36.30 (L) 10/23/2021   LDLCALC 47 10/23/2021   TRIG 167.0 (H) 10/23/2021   CHOLHDL 3 10/23/2021  -He continues on Lipitor 10 mg daily without side effects  3.  Obesity class 3 -She refused SGLT2 inhibitor and GLP-1 receptor agonists.  These would have helped with weight loss, also -he lost 17 pounds before our visit from 09/2021, most likely due to glucotoxicity -  He lost 3 more pounds before last visit and gained 2 pounds since then  Rodney Kingdom, MD PhD [facility] End[facility]

## 2022-03-18 ENCOUNTER — Encounter: Payer: Self-pay | Admitting: Gastroenterology

## 2022-03-21 ENCOUNTER — Ambulatory Visit (AMBULATORY_SURGERY_CENTER): Payer: Medicare Other | Admitting: Gastroenterology

## 2022-03-21 ENCOUNTER — Encounter: Payer: Self-pay | Admitting: Gastroenterology

## 2022-03-21 VITALS — BP 124/59 | HR 58 | Temp 96.8°F | Resp 17 | Ht 67.0 in | Wt 242.0 lb

## 2022-03-21 DIAGNOSIS — D122 Benign neoplasm of ascending colon: Secondary | ICD-10-CM

## 2022-03-21 DIAGNOSIS — Z09 Encounter for follow-up examination after completed treatment for conditions other than malignant neoplasm: Secondary | ICD-10-CM | POA: Diagnosis not present

## 2022-03-21 DIAGNOSIS — G4733 Obstructive sleep apnea (adult) (pediatric): Secondary | ICD-10-CM | POA: Diagnosis not present

## 2022-03-21 DIAGNOSIS — Z86010 Personal history of colonic polyps: Secondary | ICD-10-CM

## 2022-03-21 DIAGNOSIS — K635 Polyp of colon: Secondary | ICD-10-CM

## 2022-03-21 DIAGNOSIS — Z1211 Encounter for screening for malignant neoplasm of colon: Secondary | ICD-10-CM

## 2022-03-21 DIAGNOSIS — D123 Benign neoplasm of transverse colon: Secondary | ICD-10-CM

## 2022-03-21 MED ORDER — SODIUM CHLORIDE 0.9 % IV SOLN: 500.0000 mL | Freq: Once | INTRAVENOUS | Status: DC

## 2022-03-21 NOTE — Progress Notes (Signed)
Called to room to assist during endoscopic procedure.  Patient ID and intended procedure confirmed with present staff. Received instructions for my participation in the procedure from the performing physician.  

## 2022-03-21 NOTE — Op Note (Signed)
[place] Patient Name: Rodney Turner Procedure Date: 03/21/2022 2:22 PM MRN: [id number] Endoscopist: Mallie Mussel L. Loletha Carrow , MD Age: 71 Referring MD:  Date of Birth: 05/21/1951 Gender: Male Account #: [id number] Procedure:                Colonoscopy Indications:              Screening for colorectal malignant neoplasm                           no TA or SSP on last colonoscopy July 2009 Medicines:                Monitored Anesthesia Care Procedure:                Pre-Anesthesia Assessment:                           - Prior to the procedure, a History and Physical                            was performed, and patient medications and                            allergies were reviewed. The patient's tolerance of                            previous anesthesia was also reviewed. The risks                            and benefits of the procedure and the sedation                            options and risks were discussed with the patient.                            All questions were answered, and informed consent                            was obtained. Prior Anticoagulants: The patient has                            taken no previous anticoagulant or antiplatelet                            agents. ASA Grade Assessment: III - A patient with                            severe systemic disease. After reviewing the risks                            and benefits, the patient was deemed in                            satisfactory condition to undergo the procedure.  After obtaining informed consent, the colonoscope                            was passed under direct vision. Throughout the                            procedure, the patient's blood pressure, pulse, and                            oxygen saturations were monitored continuously. The                            CF [id number] #[id number] was introduced through the anus                            and advanced to the  the cecum, identified by                            appendiceal orifice and ileocecal valve. The                            colonoscopy was performed without difficulty. The                            patient tolerated the procedure well. The quality                            of the bowel preparation was excellent. The                            ileocecal valve, appendiceal orifice, and rectum                            were photographed. Scope In: 2:44:00 PM Scope Out: 2:59:25 PM Scope Withdrawal Time: 0 hours 11 minutes 15 seconds  Total Procedure Duration: 0 hours 15 minutes 25 seconds  Findings:                 The perianal and digital rectal examinations were                            normal.                           Repeat examination of right colon under NBI                            performed.                           Two sessile polyps were found in the transverse                            colon and ascending colon. The polyps were  diminutive in size. These polyps were removed with                            a cold snare. Resection and retrieval were complete.                           Multiple diverticula were found in the left colon.                           The exam was otherwise without abnormality on                            direct and retroflexion views. Complications:            No immediate complications. Estimated Blood Loss:     Estimated blood loss was minimal. Impression:               - Two diminutive polyps in the transverse colon and                            in the ascending colon, removed with a cold snare.                            Resected and retrieved.                           - Diverticulosis in the left colon.                           - The examination was otherwise normal on direct                            and retroflexion views. Recommendation:           - Patient has a contact number available for                             emergencies. The signs and symptoms of potential                            delayed complications were discussed with the                            patient. Return to normal activities tomorrow.                            Written discharge instructions were provided to the                            patient.                           - Resume previous diet.                           - Continue present medications.                           -  Await pathology results.                           - Repeat colonoscopy is recommended for                            surveillance (if either polyp TA or SSP). The                            colonoscopy date will be determined after pathology                            results from today's exam become available for                            review. Haset [place] L. Loletha Carrow, MD 03/21/2022 3:05:49 PM This report has been signed electronically.

## 2022-03-21 NOTE — Progress Notes (Signed)
Pt's states no medical or surgical changes since previsit or office visit. 

## 2022-03-21 NOTE — Progress Notes (Signed)
History and Physical:  This patient presents for endoscopic testing for: Encounter Diagnosis  Name Primary?   Special screening for malignant neoplasms, colon Yes    71 year old man here for screening colonoscopy.  No adenomatous or serrated polyps found on last screening colonoscopy in July 2009. Patient denies chronic abdominal pain, rectal bleeding, constipation or diarrhea.   Patient is otherwise without complaints or active issues today.   Past Medical History: Past Medical History:  Diagnosis Date   Allergic rhinitis    Allergy    Cataract    forming   Diabetes mellitus ([place]) 05/24/2014   History of kidney stones 04/19/1989   Hyperlipidemia    Morbid obesity ([place]) 03/17/2014   OBSTRUCTIVE SLEEP APNEA 02/22/2008   HST 2013:  AHI 51/hr.  Optimal pressure 12cm on autotitration.     OSA on CPAP    Sleep apnea    on cpap     Past Surgical History: Past Surgical History:  Procedure Laterality Date   COLONOSCOPY  2009   KNEE SURGERY     left    Allergies: Allergies  Allergen Reactions   Codeine Other (See Comments)    REACTION: nausea    Outpatient Meds: Current Outpatient Medications  Medication Sig Dispense Refill   aspirin 81 MG tablet Take 81 mg by mouth daily.     atorvastatin (LIPITOR) 10 MG tablet TAKE 1 TABLET EVERY DAY AT 6PM 90 tablet 3   Blood Glucose Monitoring Suppl (ONE TOUCH ULTRA 2) w/Device KIT Use to check sugars daily. 1 each 0   glipiZIDE (GLUCOTROL XL) 5 MG 24 hr tablet TAKE 1 TABLET BEFORE BREAKFAST AND 1 TABLET BEFORE SUPPER 180 tablet 3   glucose blood (ONETOUCH ULTRA) test strip 1 each by Other route daily. E11.9 100 each 6   insulin degludec (TRESIBA FLEXTOUCH) 200 UNIT/ML FlexTouch Pen Inject 16 Units into the skin daily. (Patient taking differently: Inject 16 Units into the skin daily. Patient stated he is taking 14 units for the last 3 weeks.) 9 mL 3   Insulin Pen Needle 32G X 4 MM MISC Use 1x a day 100 each 3   Lancets (ONETOUCH  ULTRASOFT) lancets Use as instructed check sugar one time daily. 100 each 6   metFORMIN (GLUCOPHAGE) 500 MG tablet TAKE 1 TABLET BY MOUTH 2 TIMES DAILY WITH A MEAL. 180 tablet 3   triamcinolone cream (KENALOG) 0.1 % APPLY  CREAM EXTERNALLY TO AFFECTED AREA TWICE DAILY 45 g 0   acetaminophen (TYLENOL) 650 MG CR tablet Take 650 mg by mouth every 8 (eight) hours as needed for pain.     fluticasone (FLONASE) 50 MCG/ACT nasal spray Place 1 spray into both nostrils daily.     guaiFENesin (MUCINEX) 600 MG 12 hr tablet Take 1,200 mg by mouth 2 (two) times daily as needed for cough or to loosen phlegm.      ibuprofen (ADVIL,MOTRIN) 100 MG tablet Take 100 mg by mouth every 6 (six) hours as needed for pain.      Current Facility-Administered Medications  Medication Dose Route Frequency Provider Last Rate Last Admin   0.9 %  sodium chloride infusion  500 mL Intravenous Once Nelida Meuse III, MD          ___________________________________________________________________ Objective   Exam:  BP (!) 123/56   Pulse 65   Temp (!) 96.8 F (36 C)   Ht 5' 7"  (1.702 m)   Wt 242 lb (109.8 kg)   SpO2 98%   BMI  37.90 kg/m   CV: RRR without murmur, S1/S2 Resp: clear to auscultation bilaterally, normal RR and effort noted GI: soft, no tenderness, with active bowel sounds.   Assessment: Encounter Diagnosis  Name Primary?   Special screening for malignant neoplasms, colon Yes     Plan: Colonoscopy  The benefits and risks of the planned procedure were described in detail with the patient or (when appropriate) their health care proxy.  Risks were outlined as including, but not limited to, bleeding, infection, perforation, adverse medication reaction leading to cardiac or pulmonary decompensation, pancreatitis (if ERCP).  The limitation of incomplete mucosal visualization was also discussed.  No guarantees or warranties were given.    The patient is appropriate for an endoscopic procedure in the  ambulatory setting.   - Wilfrid Lund, MD

## 2022-03-21 NOTE — Progress Notes (Signed)
Sedate, gd SR, tolerated procedure well, VSS, report to RN 

## 2022-03-21 NOTE — Patient Instructions (Signed)
Discharge instructions given. Handouts on polyps and Diverticulosis. Resume previous medications. YOU HAD AN ENDOSCOPIC PROCEDURE TODAY AT [facility]:   Refer to the procedure report that was given to you for any specific questions about what was found during the examination.  If the procedure report does not answer your questions, please call your gastroenterologist to clarify.  If you requested that your care partner not be given the details of your procedure findings, then the procedure report has been included in a sealed envelope for you to review at your convenience later.  YOU SHOULD EXPECT: Some feelings of bloating in the abdomen. Passage of more gas than usual.  Walking can help get rid of the air that was put into your GI tract during the procedure and reduce the bloating. If you had a lower endoscopy (such as a colonoscopy or flexible sigmoidoscopy) you may notice spotting of blood in your stool or on the toilet paper. If you underwent a bowel prep for your procedure, you may not have a normal bowel movement for a few days.  Please Note:  You might notice some irritation and congestion in your nose or some drainage.  This is from the oxygen used during your procedure.  There is no need for concern and it should clear up in a day or so.  SYMPTOMS TO REPORT IMMEDIATELY:  Following lower endoscopy (colonoscopy or flexible sigmoidoscopy):  Excessive amounts of blood in the stool  Significant tenderness or worsening of abdominal pains  Swelling of the abdomen that is new, acute  Fever of 100F or higher   For urgent or emergent issues, a gastroenterologist can be reached at any hour by calling [phone]. Do not use MyChart messaging for urgent concerns.    DIET:  We do recommend a small meal at first, but then you may proceed to your regular diet.  Drink plenty of fluids but you should avoid alcoholic beverages for 24 hours.  ACTIVITY:  You should plan to take it  easy for the rest of today and you should NOT DRIVE or use heavy machinery until tomorrow (because of the sedation medicines used during the test).    FOLLOW UP: Our staff will call the number listed on your records the next business day following your procedure.  We will call around 7:15- 8:00 am to check on you and address any questions or concerns that you may have regarding the information given to you following your procedure. If we do not reach you, we will leave a message.  If you develop any symptoms (ie: fever, flu-like symptoms, shortness of breath, cough etc.) before then, please call [phone].  If you test positive for Covid 19 in the 2 weeks post procedure, please call and report this information to [place].    If any biopsies were taken you will be contacted by phone or by letter within the next 1-3 weeks.  Please call us at [phone] if you have not heard about the biopsies in 3 weeks.    SIGNATURES/CONFIDENTIALITY: You and/or your care partner have signed paperwork which will be entered into your electronic medical record.  These signatures attest to the fact that that the information above on your After Visit Summary has been reviewed and is understood.  Full responsibility of the confidentiality of this discharge information lies with you and/or your care-partner.

## 2022-03-22 ENCOUNTER — Telehealth: Payer: Self-pay

## 2022-03-22 NOTE — Telephone Encounter (Signed)
  Follow up Call-     03/21/2022    1:38 PM  Call back number  Post procedure Call Back phone  # [phone]  Permission to leave phone message Yes     Patient questions:  Do you have a fever, pain , or abdominal swelling? No. Pain Score  0 *  Have you tolerated food without any problems? Yes.    Have you been able to return to your normal activities? Yes.    Do you have any questions about your discharge instructions: Diet   No. Medications  No. Follow up visit  No.  Do you have questions or concerns about your Care? No.  Actions: * If pain score is 4 or above: No action needed, pain <4.

## 2022-03-26 ENCOUNTER — Encounter: Payer: Self-pay | Admitting: Gastroenterology

## 2022-06-29 ENCOUNTER — Other Ambulatory Visit: Payer: Self-pay | Admitting: Internal Medicine

## 2022-06-29 DIAGNOSIS — E119 Type 2 diabetes mellitus without complications: Secondary | ICD-10-CM

## 2022-07-18 ENCOUNTER — Ambulatory Visit: Payer: Medicare Other | Admitting: Internal Medicine

## 2022-07-18 ENCOUNTER — Encounter: Payer: Self-pay | Admitting: Internal Medicine

## 2022-07-18 VITALS — BP 138/80 | HR 55 | Ht 67.0 in | Wt 243.6 lb

## 2022-07-18 DIAGNOSIS — E785 Hyperlipidemia, unspecified: Secondary | ICD-10-CM

## 2022-07-18 DIAGNOSIS — E1122 Type 2 diabetes mellitus with diabetic chronic kidney disease: Secondary | ICD-10-CM | POA: Diagnosis not present

## 2022-07-18 LAB — POCT GLYCOSYLATED HEMOGLOBIN (HGB A1C): Hemoglobin A1C: 6.5 % — AB (ref 4.0–5.6)

## 2022-07-18 MED ORDER — TRESIBA FLEXTOUCH 200 UNIT/ML ~~LOC~~ SOPN: 20.0000 [IU] | PEN_INJECTOR | Freq: Every day | SUBCUTANEOUS | 3 refills | Status: DC

## 2022-07-18 NOTE — Patient Instructions (Addendum)
Please continue: - Metformin 1000 mg at dinnertime - Glipizide ER 5 mg before b'fast  - Glipizide ER 5 mg before dinner (2 halves of a tablet) - Tresiba U200 18-20 units daily  Check some sugars after dinner - if these are at goal (<140), then you can try to increase Tresiba by 2-4 units. If they are high, you can take 3 halves of glipizide before dinner.   Please return in 4 months with your sugar log.

## 2022-07-18 NOTE — Progress Notes (Signed)
Patient ID: Rodney Turner., male   DOB: 03/28/1951, 71 y.o.   MRN: [id number]   HPI: Rodney Turner. is a 71 y.o.-year-old male, initially referred by his PCP, Dr. Everlene Farrier, presenting for f/u for DM2, dx in 03/2016, insulin-dependent, uncontrolled, with complications (mild CKD). His wife, Rodney Turner, is also my pt. Last visit 4 months ago.  Interim history: No increased urination, nausea, chest pain.   He continues to walk for exercise.  Reviewed history: In 01/2016, he started to feel very fatigued, frequent urination, nocturia, weight loss - saw PCP >> HbA1c very high (>14%). Prev. HbA1c levels were in the 6-7% range.  Reviewed HbA1c levels: Lab Results  Component Value Date   HGBA1C 6.1 (A) 03/14/2022   HGBA1C 11.1 (A) 09/21/2021   HGBA1C 7.9 (A) 12/25/2020   HGBA1C 7.3 (A) 12/31/2019   HGBA1C 5.9 (A) 05/19/2018   HGBA1C 5.9 11/17/2017   HGBA1C 5.5 05/19/2017   HGBA1C 5.9 01/15/2017   HGBA1C 5.6 10/15/2016   HGBA1C 5.7 07/24/2016   HGBA1C >14.0 04/16/2016   HGBA1C 6.6 01/20/2015   HGBA1C 6.3 05/24/2014   Pt is on a regimen of: - Metformin 1000 mg at dinnertime - Glipizide ER 5 mg before b'fast  - Glipizide ER 5 mg before dinner (2 halves of a tablet) - Tresiba U200 16 >> 20 >> 14-18 units daily - started 09/2021 >> 24 >> 18-20 units daily He contacted me in 07/2019 with high blood sugars.  I suggested Ozempic but he did not start this as he did not want to use injectables.  He also refused to start [place].  Pt checks his sugars 1-2 times a day: - am:  116-165, 188 >> 123-154, 178, 190 >> 117-171, 182 - 2h after b'fast: n/c - before lunch: 100-131 >> 115-137, 171 >> <150 >> n/c - 2h after lunch: 230 (double cheeseburger and fries) >> n/c  - before dinner: 180s-200s >> 81-120 >> 89-150 >> 77-130, 150 - 2h after dinner: n/c >> 120-159 >> n/c - bedtime: 149 >> <180 >> 175, 180 >> n/c - nighttime: n/c Lowest sugar was100 >> 180 >> 81 >> 89 >> 77; he has  hypoglycemia awareness in the 70s. Highest sugar was 300s >> 200s >> 190 >> 182.  Pt's meals are: - Breakfast: McDonal[employer] >> burrito - Lunch: grilled chicken sandwich - Dinner: Chick[employer]fil[employer]a or [employer]'s salad or meat + veggies + starch - Snacks: 1: chips, popcorn, fruit cups, fresh fruit >> cough drops, pork rinds He was previously going to the gym.  He has a treadmill.  -+ Mild CKD. Last BUN/creatinine:  Lab Results  Component Value Date   BUN 13 10/23/2021   BUN 17 12/31/2019   CREATININE 0.91 10/23/2021   CREATININE 0.98 12/31/2019   Lab Results  Component Value Date   MICRALBCREAT 1.5 10/23/2021   MICRALBCREAT 0.8 12/31/2019   MICRALBCREAT 0.6 05/19/2017   -+ HL; last set of lipids: Lab Results  Component Value Date   CHOL 116 10/23/2021   HDL 36.30 (L) 10/23/2021   LDLCALC 47 10/23/2021   TRIG 167.0 (H) 10/23/2021   CHOLHDL 3 10/23/2021  On Lipitor 10.  - last eye exam was in 05/2021: No DR. + mild cataracts. Dr. Sabra Heck.  -No numbness and tingling in his feet.  Last foot exam 11/05/2021.  He also has OSA - on CPAP.Rodney Kitchen He fell and fractured his left humerus on 11/26/2020.  He retired 05/2017.  ROS: + see HPI  I reviewed  pt's medications, allergies, PMH, social hx, family hx, and changes were documented in the history of present illness. Otherwise, unchanged from my initial visit note.  Past Medical History:  Diagnosis Date   Allergic rhinitis    Allergy    Cataract    forming   Diabetes mellitus ([place]) 05/24/2014   History of kidney stones 04/19/1989   Hyperlipidemia    Morbid obesity ([place]) 03/17/2014   OBSTRUCTIVE SLEEP APNEA 02/22/2008   HST 2013:  AHI 51/hr.  Optimal pressure 12cm on autotitration.     OSA on CPAP    Sleep apnea    on cpap   Past Surgical History:  Procedure Laterality Date   COLONOSCOPY  2009   KNEE SURGERY     left   Social History   Social History   Marital status: Married    Spouse name: N/A   Number of children: 2    Occupational History   sales    Social History Main Topics   Smoking status: Never Smoker   Smokeless tobacco: Never Used   Alcohol use No   Drug use: No   Current Outpatient Medications on File Prior to Visit  Medication Sig Dispense Refill   acetaminophen (TYLENOL) 650 MG CR tablet Take 650 mg by mouth every 8 (eight) hours as needed for pain.     aspirin 81 MG tablet Take 81 mg by mouth daily.     atorvastatin (LIPITOR) 10 MG tablet TAKE 1 TABLET EVERY DAY AT 6PM 90 tablet 3   Blood Glucose Monitoring Suppl (ONE TOUCH ULTRA 2) w/Device KIT Use to check sugars daily. 1 each 0   fluticasone (FLONASE) 50 MCG/ACT nasal spray Place 1 spray into both nostrils daily.     glipiZIDE (GLUCOTROL XL) 5 MG 24 hr tablet TAKE 1 TABLET BEFORE BREAKFAST AND 1 TABLET BEFORE SUPPER 180 tablet 3   glucose blood (ONETOUCH ULTRA) test strip 1 each by Other route daily. E11.9 100 each 6   guaiFENesin (MUCINEX) 600 MG 12 hr tablet Take 1,200 mg by mouth 2 (two) times daily as needed for cough or to loosen phlegm.      ibuprofen (ADVIL,MOTRIN) 100 MG tablet Take 100 mg by mouth every 6 (six) hours as needed for pain.      insulin degludec (TRESIBA FLEXTOUCH) 200 UNIT/ML FlexTouch Pen Inject 16 Units into the skin daily. (Patient taking differently: Inject 16 Units into the skin daily. Patient stated he is taking 14 units for the last 3 weeks.) 9 mL 3   Insulin Pen Needle 32G X 4 MM MISC Use 1x a day 100 each 3   Lancets (ONETOUCH ULTRASOFT) lancets Use as instructed check sugar one time daily. 100 each 6   metFORMIN (GLUCOPHAGE) 500 MG tablet TAKE 1 TABLET BY MOUTH 2 TIMES DAILY WITH A MEAL. 180 tablet 3   triamcinolone cream (KENALOG) 0.1 % APPLY  CREAM EXTERNALLY TO AFFECTED AREA TWICE DAILY 45 g 0   No current facility-administered medications on file prior to visit.   Allergies  Allergen Reactions   Codeine Other (See Comments)    REACTION: nausea   Family History  Problem Relation Age of Onset    Heart disease Mother    Gallstones Mother    Snoring Father    Esophageal varices Father    Diabetes Paternal Grandmother    Colon cancer Neg Hx    Colon polyps Neg Hx    Esophageal cancer Neg Hx    Rectal cancer Neg  Hx    Stomach cancer Neg Hx    PE: BP 138/80 (BP Location: Left Arm, Patient Position: Sitting, Cuff Size: Normal)   Pulse (!) 55   Ht _0  (1.702 m)   Wt 243 lb 9.6 oz (110.5 kg)   SpO2 95%   BMI 38.15 kg/m  Wt Readings from Last 3 Encounters:  07/18/22 243 lb 9.6 oz (110.5 kg)  03/21/22 242 lb (109.8 kg)  03/14/22 242 lb 9.6 oz (110 kg)   Constitutional: overweight, in NAD Eyes: EOMI, no exophthalmos ENT: no thyromegaly, no cervical lymphadenopathy Cardiovascular: RRR, No MRG Respiratory: CTA B Musculoskeletal: no deformities Skin: + rash (NLD?) on medial side of left lower leg Neurological: no tremor with outstretched hands  ASSESSMENT: 1. DM2, insulin-dependent, uncontrolled, with complications -Mild CKD  2. HL  3. Obesity class 3 BMI Classification: < 18.5 underweight  18.5-24.9 normal weight  25.0-29.9 overweight  30.0-34.9 class I obesity  35.0-39.9 class II obesity  ? 40.0 class III obesity   PLAN:  1. Patient with previously fairly well-controlled type 2 diabetes, on metformin and sulfonylurea only, but with deteriorating blood sugar control when he returned after a long absence in 09/2021.  HbA1c was 11.1%, much higher.  We added long-acting insulin.  At last visit, sugars were spectacularly improved, with an HbA1c of 6.1%.  We did not change his regimen. -At last visit, he was less active due to heat and I advised him about using a treadmill.  He did start to walk more since then. -At today's visit, sugars appear to be at goal before dinner but they are slightly higher in the morning.  We discussed that this may be a sign of not enough [place] or insufficient dinnertime coverage.  I recommended that he check blood sugars 2 hours after  dinner or at bedtime, to clarify.  If sugars are higher after dinner, we discussed that after some dinners, he may need to take 3 halves of a glipizide tablet (7.5 mg) but his sugars are at goal after dinner, to increase [place] by 2 to 4 units.  He agrees with the plan. - I suggested to:  Patient Instructions  Please continue: - Metformin 1000 mg at dinnertime - Glipizide ER 5 mg before b'fast  - Glipizide ER 5 mg before dinner (2 halves of a tablet) - Tresiba U200 18-20 units daily  Check some sugars after dinner - if these are at goal (<140), then you can try to increase Tresiba by 2-4 units. If they are high, you can take 3 halves of glipizide before dinner.   Please return in 4 months with your sugar log.  - we checked his HbA1c: 6.5% (slightly higher) - advised to check sugars at different times of the day - 4x a day, rotating check times - advised for yearly eye exams >> he is not UTD - return to clinic in 4 months  2. HL -Reviewed latest lipid panel from 10/2021: LDL at goal, trace right slightly high, HDL slightly low: Lab Results  Component Value Date   CHOL 116 10/23/2021   HDL 36.30 (L) 10/23/2021   LDLCALC 47 10/23/2021   TRIG 167.0 (H) 10/23/2021   CHOLHDL 3 10/23/2021  -He continues on Lipitor 10 mg daily without side effects  3.  Obesity class 3 -He refused SGLT2 inhibitor and GLP-1 receptor agonist.  These would have helped with weight loss.   -He gained 2 pounds since last visit -Weight is stable since then  [place]  Cruzita Lederer, MD PhD [facility]

## 2022-07-22 LAB — HM DIABETES EYE EXAM

## 2022-07-26 ENCOUNTER — Encounter: Payer: Self-pay | Admitting: Internal Medicine

## 2022-08-01 ENCOUNTER — Telehealth (INDEPENDENT_AMBULATORY_CARE_PROVIDER_SITE_OTHER): Payer: Medicare Other | Admitting: Nurse Practitioner

## 2022-08-01 DIAGNOSIS — J069 Acute upper respiratory infection, unspecified: Secondary | ICD-10-CM

## 2022-08-01 MED ORDER — AZITHROMYCIN 250 MG PO TABS: ORAL_TABLET | ORAL | 0 refills | Status: AC

## 2022-08-01 NOTE — Assessment & Plan Note (Signed)
Acute, discussed with patient that more than likely this is a viral illness that will have to run its course and he should treat his symptoms with over-the-counter antihistamine, Mucinex, and Delsym if coughing occurs.  He was educated on the expected course of viral illness and that symptoms should resolve within 10 to 14 days however if symptoms persist at that point we may consider antibiotic therapy.  Due to him going out of town I will send in [place] that he can take if symptoms persist past 10 to 14 days, patient reports that he is agreeable to waiting to take the antibiotic.  He was encouraged to notify us if symptoms persist or worsen, he was also encouraged to take another COVID-19 test and if positive to call our office back.  Patient reports understanding.

## 2022-08-01 NOTE — Progress Notes (Signed)
Established Patient Office Visit  An audio-only tele-health visit was completed today for this patient. I connected with  Rodney Mcalpine. on 08/01/22 utilizing audio-only technology and verified that I am speaking with the correct person using two identifiers. The patient was located at a public area but gave consent to proceed, and I was located at the office of [facility] at [facility] during the encounter. I discussed the limitations of evaluation and management by telemedicine. The patient expressed understanding and agreed to proceed.     Subjective   Patient ID: Rodney Filippini., male    DOB: 1951-03-11  Age: 71 y.o. MRN: [id number]  Chief Complaint  Patient presents with   Nasal Congestion    Patient arrives for virtual visit for the above.  Reports having been exposed to multiple sick contacts, and approximately 2 days ago started experiencing postnasal drip and congestion.  He coughs here and there but not very often.  He is not experiencing shortness of breath, has been checking his temperature and has not been running a fever.  He reports he is getting ready to travel this upcoming weekend and is concerned that he may need treatment to prevent serious infection.  Reports that one of the sick contacts was his wife who tested negative for COVID.  He himself has not yet tested for COVID.    Review of Systems  Constitutional:  Negative for chills.  HENT:  Positive for congestion (slightly colored nasal discharge).        (+) PND  Respiratory:  Negative for cough and shortness of breath.   Cardiovascular:  Negative for chest pain.  Musculoskeletal:  Negative for myalgias.  Neurological:  Negative for headaches.      Objective:     There were no vitals taken for this visit.   Physical Exam Comprehensive physical exam not completed today as office visit was conducted remotely.  Patient sounded well over the phone, he is able to speak in complete  sentences without having to stop to breathe.  Patient was alert and oriented, and appeared to have appropriate judgment.   No results found for any visits on 08/01/22.    The ASCVD Risk score (Arnett DK, et al., 2019) failed to calculate for the following reasons:   The valid total cholesterol range is 130 to 320 mg/dL    Assessment & Plan:   Problem List Items Addressed This Visit       Respiratory   Upper respiratory tract infection - Primary    Acute, discussed with patient that more than likely this is a viral illness that will have to run its course and he should treat his symptoms with over-the-counter antihistamine, Mucinex, and Delsym if coughing occurs.  He was educated on the expected course of viral illness and that symptoms should resolve within 10 to 14 days however if symptoms persist at that point we may consider antibiotic therapy.  Due to him going out of town I will send in [place] that he can take if symptoms persist past 10 to 14 days, patient reports that he is agreeable to waiting to take the antibiotic.  He was encouraged to notify us if symptoms persist or worsen, he was also encouraged to take another COVID-19 test and if positive to call our office back.  Patient reports understanding.      Relevant Medications   azithromycin (ZITHROMAX) 250 MG tablet    No follow-ups on file.  Total time  spent with telephone was 11 minutes and 37 seconds.   Ailene Ards, NP

## 2022-09-13 DIAGNOSIS — H18413 Arcus senilis, bilateral: Secondary | ICD-10-CM | POA: Diagnosis not present

## 2022-09-13 DIAGNOSIS — H2513 Age-related nuclear cataract, bilateral: Secondary | ICD-10-CM | POA: Diagnosis not present

## 2022-09-13 DIAGNOSIS — H40013 Open angle with borderline findings, low risk, bilateral: Secondary | ICD-10-CM | POA: Diagnosis not present

## 2022-09-13 DIAGNOSIS — H35371 Puckering of macula, right eye: Secondary | ICD-10-CM | POA: Diagnosis not present

## 2022-09-13 DIAGNOSIS — H2511 Age-related nuclear cataract, right eye: Secondary | ICD-10-CM | POA: Diagnosis not present

## 2022-09-24 ENCOUNTER — Encounter: Payer: Self-pay | Admitting: Emergency Medicine

## 2022-09-24 ENCOUNTER — Other Ambulatory Visit: Payer: Self-pay | Admitting: Internal Medicine

## 2022-09-24 DIAGNOSIS — R21 Rash and other nonspecific skin eruption: Secondary | ICD-10-CM

## 2022-09-25 MED ORDER — TRIAMCINOLONE ACETONIDE 0.1 % EX CREA: TOPICAL_CREAM | CUTANEOUS | 0 refills | Status: DC

## 2022-09-29 ENCOUNTER — Other Ambulatory Visit: Payer: Self-pay | Admitting: Internal Medicine

## 2022-11-19 ENCOUNTER — Ambulatory Visit: Payer: Medicare Other | Admitting: Internal Medicine

## 2022-12-02 DIAGNOSIS — H52202 Unspecified astigmatism, left eye: Secondary | ICD-10-CM | POA: Diagnosis not present

## 2022-12-02 DIAGNOSIS — H2512 Age-related nuclear cataract, left eye: Secondary | ICD-10-CM | POA: Diagnosis not present

## 2022-12-03 DIAGNOSIS — H2511 Age-related nuclear cataract, right eye: Secondary | ICD-10-CM | POA: Diagnosis not present

## 2022-12-03 DIAGNOSIS — H25041 Posterior subcapsular polar age-related cataract, right eye: Secondary | ICD-10-CM | POA: Diagnosis not present

## 2022-12-03 DIAGNOSIS — H25011 Cortical age-related cataract, right eye: Secondary | ICD-10-CM | POA: Diagnosis not present

## 2022-12-17 ENCOUNTER — Telehealth: Payer: Self-pay

## 2022-12-17 NOTE — Telephone Encounter (Signed)
Called patient to reschedule Medicare Annual Wellness Visit (AWV). Left message for patient to call back and schedule Medicare Annual Wellness Visit (AWV).    Please reschedule 01/03/23 appointment at any time On Annual Wellness Visit Schedule.

## 2022-12-18 ENCOUNTER — Telehealth: Payer: Self-pay

## 2022-12-18 NOTE — Telephone Encounter (Signed)
Contacted [name]. to schedule their annual wellness visit. Appointment made for 01/07/23.

## 2022-12-20 IMAGING — CT CT HEAD W/O CM
3 series · 15 of 47 positions shown, 18 images · non-contrast
Comparison: None.

CLINICAL DATA: Pain following fall

EXAM:
CT HEAD WITHOUT CONTRAST
TECHNIQUE: Contiguous axial images were obtained from the base of the skull
through the vertex without intravenous contrast.

[Series 2: head wo · axial · 0.42mm/px · z∈[-211,-81]mm · 9 of 32 slices shown, 12 images]
[im 3/32  brain]
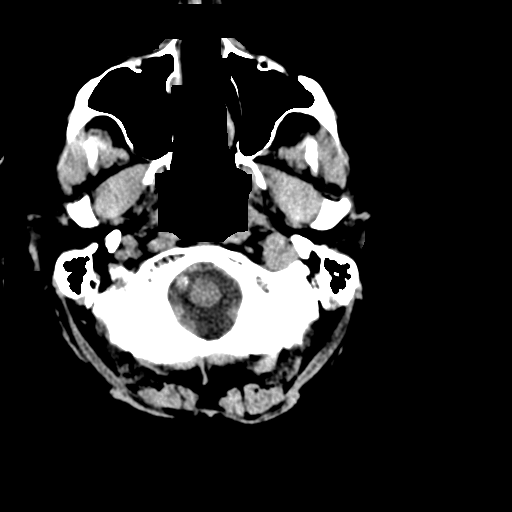
[im 3/32  bone]
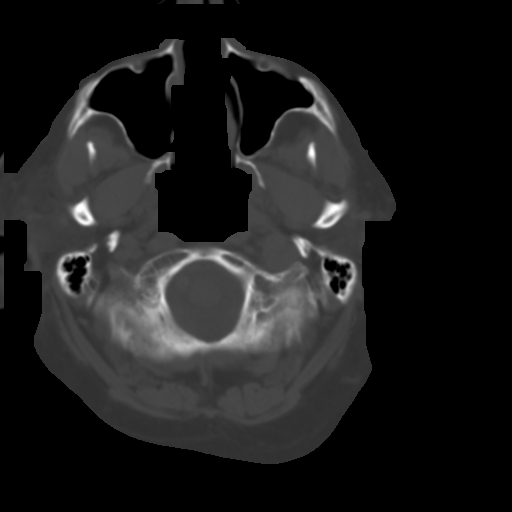
[im 6/32  brain]
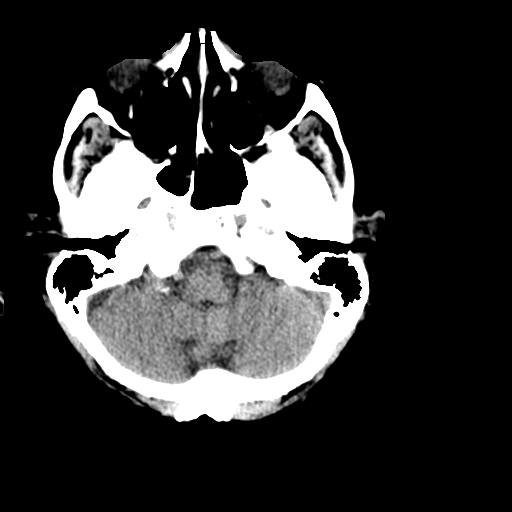
[im 9/32  brain]
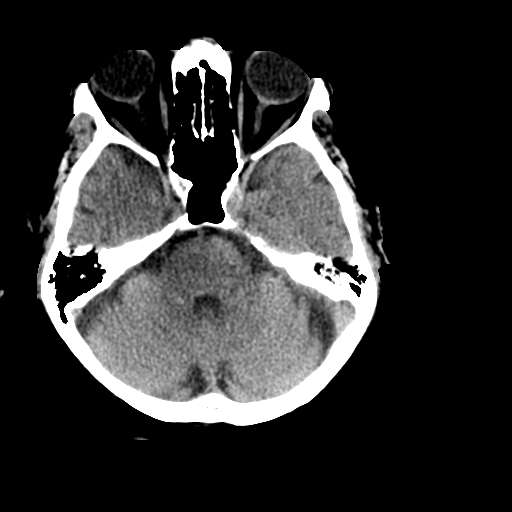
[im 12/32  brain]
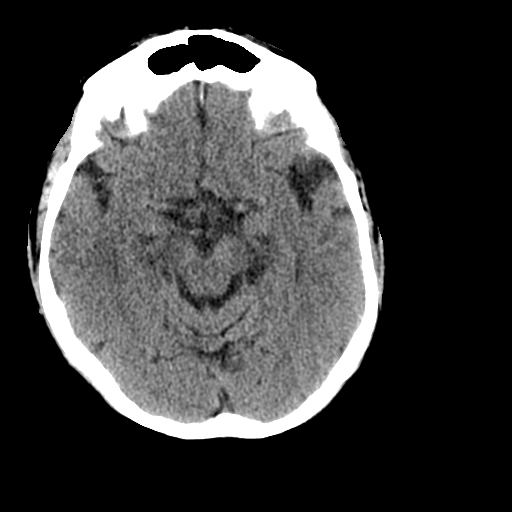
[im 17/32  brain]
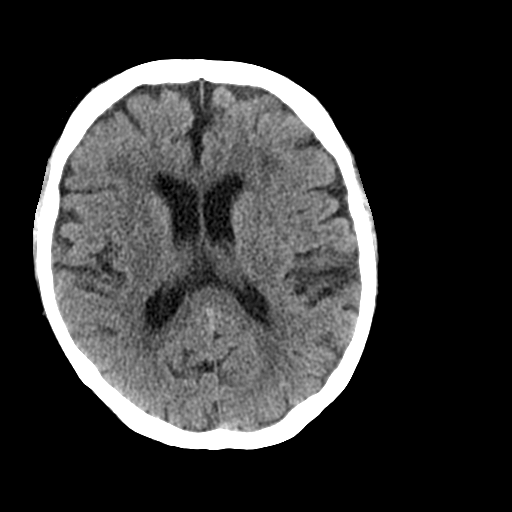
[im 17/32  bone]
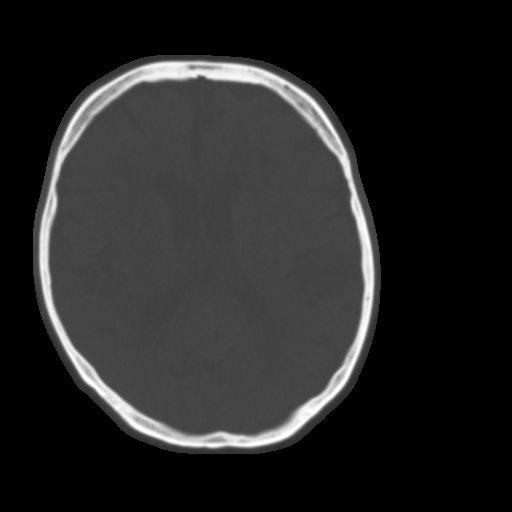
[im 20/32  brain]
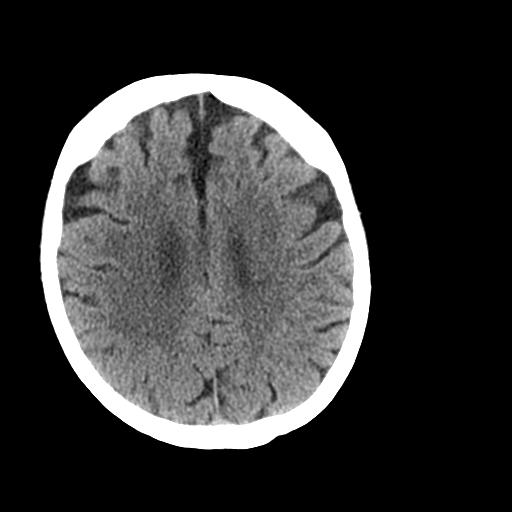
[im 23/32  brain]
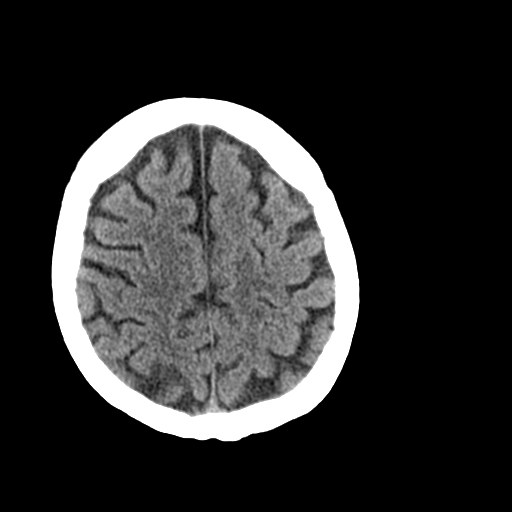
[im 26/32  brain]
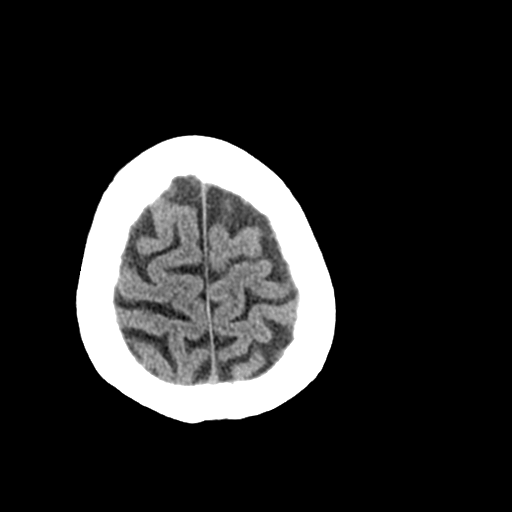
[im 29/32  brain]
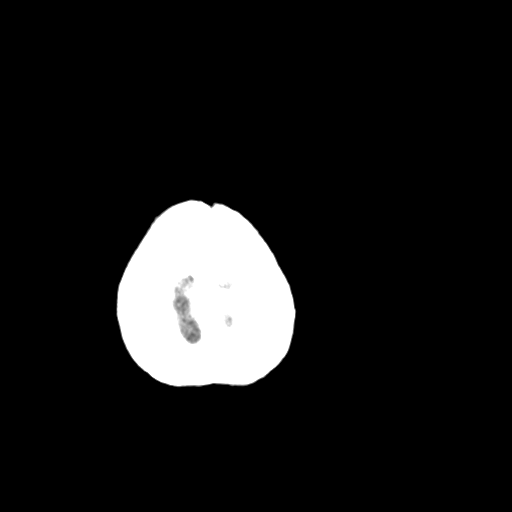
[im 29/32  bone]
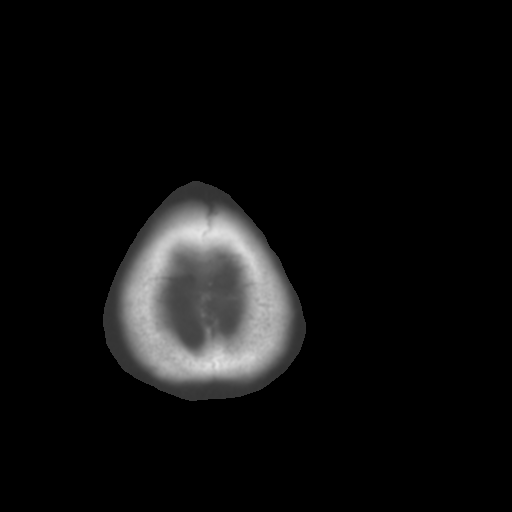

[Series 4: coronal soft tissue · coronal · 0.33mm/px · 3 of 67 slices shown]
[im 23/67  brain]
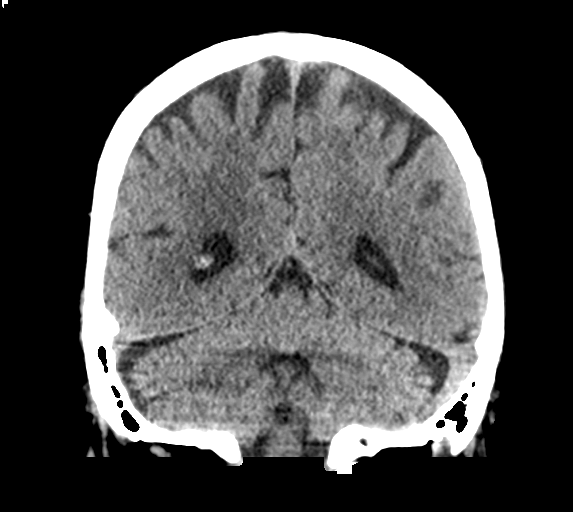
[im 30/67  brain]
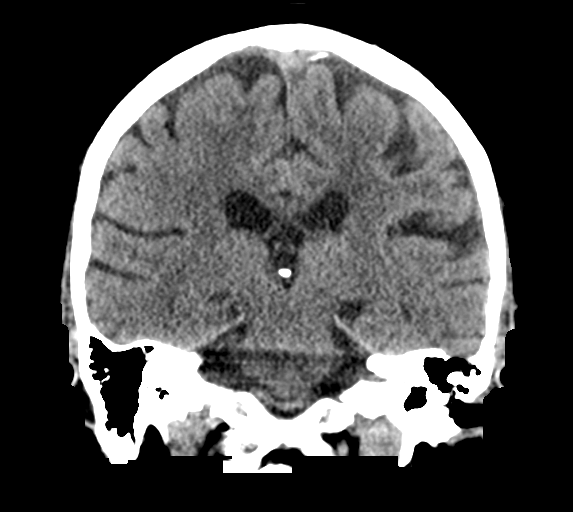
[im 37/67  brain]
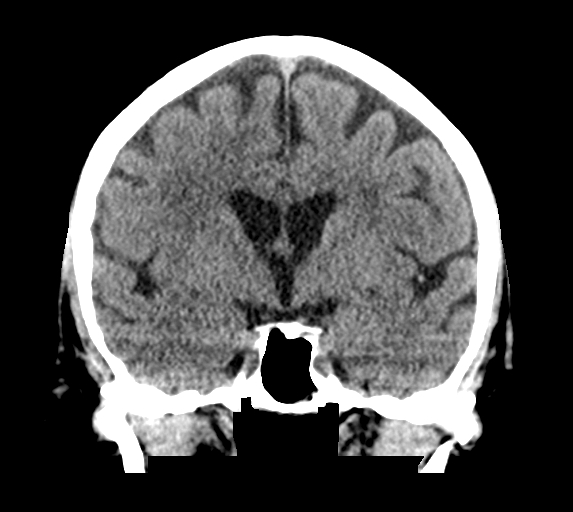

[Series 5: sagittal soft tissue · sagittal · 0.31mm/px · 3 of 62 slices shown]
[im 21/62  brain]
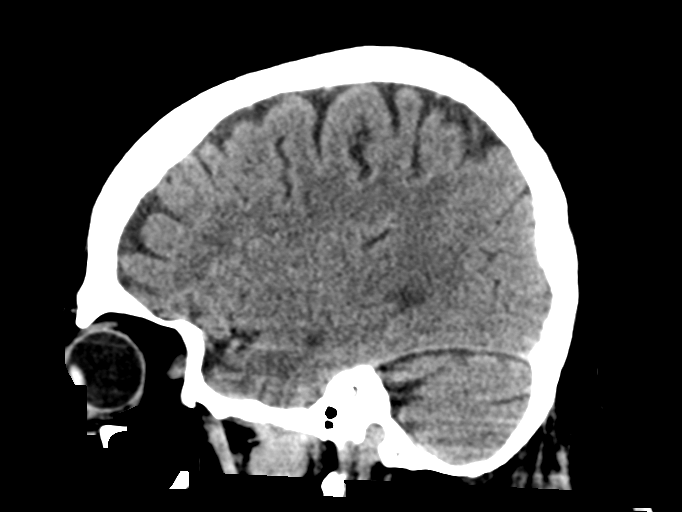
[im 31/62  brain]
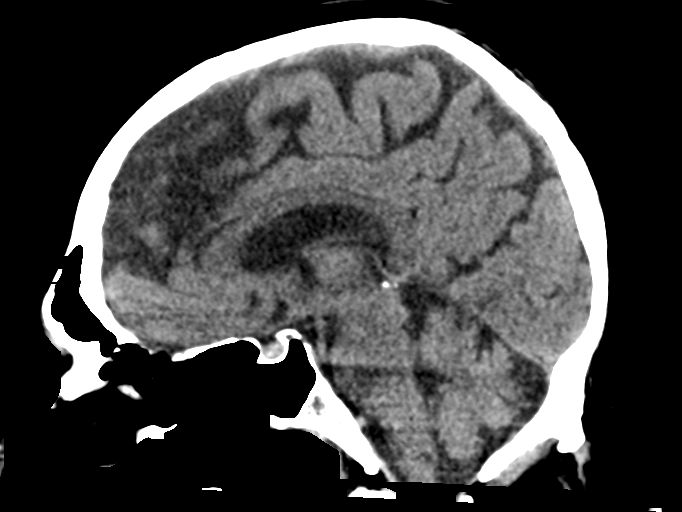
[im 41/62  brain]
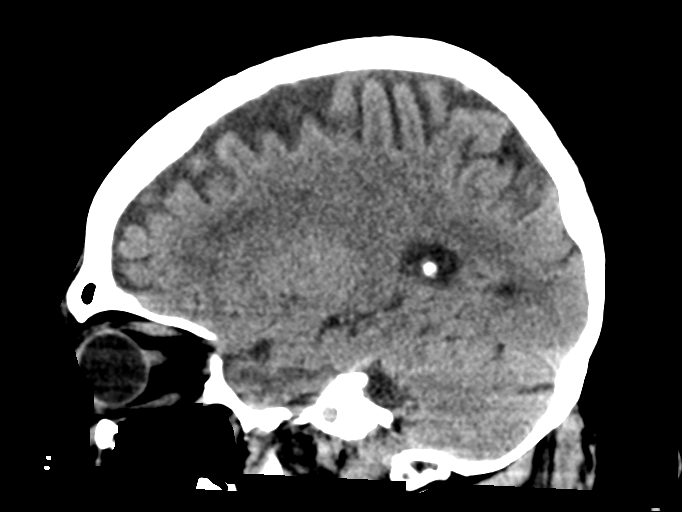

[15 of 47 positions shown; findings below may reference images not displayed]

FINDINGS: Brain: There is mild diffuse atrophy. There is no intracranial mass,
hemorrhage, extra-axial fluid collection, or midline shift. There is
mild patchy small vessel disease in the centra semiovale
bilaterally. Elsewhere brain parenchyma appears unremarkable. No
evident acute infarct.

Vascular: No hyperdense vessel. No appreciable vascular
calcification.

Skull: The bony calvarium appears intact.

Sinuses/Orbits: Mucosal thickening noted in several ethmoid air
cells. Other visualized paranasal sinuses are clear. Orbits appear
symmetric bilaterally.

Other: Mastoid air cells are clear.
IMPRESSION: Mild atrophy with mild periventricular small vessel disease. No
acute infarct. No mass or hemorrhage.

Mucosal thickening noted in several ethmoid air cells.

## 2022-12-23 DIAGNOSIS — Z961 Presence of intraocular lens: Secondary | ICD-10-CM | POA: Diagnosis not present

## 2022-12-23 DIAGNOSIS — H2511 Age-related nuclear cataract, right eye: Secondary | ICD-10-CM | POA: Diagnosis not present

## 2022-12-23 DIAGNOSIS — H52201 Unspecified astigmatism, right eye: Secondary | ICD-10-CM | POA: Diagnosis not present

## 2022-12-25 ENCOUNTER — Other Ambulatory Visit: Payer: Self-pay | Admitting: Internal Medicine

## 2023-01-07 ENCOUNTER — Ambulatory Visit (INDEPENDENT_AMBULATORY_CARE_PROVIDER_SITE_OTHER): Payer: Medicare Other

## 2023-01-07 VITALS — Ht 67.0 in

## 2023-01-07 DIAGNOSIS — Z0000 Encounter for general adult medical examination without abnormal findings: Secondary | ICD-10-CM

## 2023-01-07 NOTE — Progress Notes (Signed)
I connected with  Rodney Turner. on 01/07/23 by a audio enabled telemedicine application and verified that I am speaking with the correct person using two identifiers.  Patient Location: Home  Provider Location: Office/Clinic  I discussed the limitations of evaluation and management by telemedicine. The patient expressed understanding and agreed to proceed.  Patient Medicare AWV questionnaire was completed by the patient on 01/03/2023; I have confirmed that all information answered by patient is correct and no changes since this date.    Subjective:   Rodney Turner. is a 72 y.o. male who presents for Medicare Annual/Subsequent preventive examination.  Review of Systems     Cardiac Risk Factors include: advanced age (>43men, >3 women);diabetes mellitus;dyslipidemia;family history of premature cardiovascular disease;male gender;obesity (BMI >30kg/m2)     Objective:    Today's Vitals   01/07/23 0954  Height: 5\' 7"  (1.702 m)  PainSc: 0-No pain   Body mass index is 38.15 kg/m.     01/07/2023    9:59 AM 01/01/2022    9:24 AM 10/25/2019   10:34 AM  Advanced Directives  Does Patient Have a Medical Advance Directive? No No No  Would patient like information on creating a medical advance directive? No - Patient declined No - Patient declined Yes (ED - Information included in AVS)    Current Medications (verified) Outpatient Encounter Medications as of 01/07/2023  Medication Sig   gatifloxacin (ZYMAXID) 0.5 % SOLN Place 1 drop into the left eye 4 (four) times daily.   ketorolac (ACULAR) 0.5 % ophthalmic solution Place 1 drop into the right eye 4 (four) times daily.   prednisoLONE acetate (PRED FORTE) 1 % ophthalmic suspension Place 1 drop into the left eye 4 (four) times daily.   acetaminophen (TYLENOL) 650 MG CR tablet Take 650 mg by mouth every 8 (eight) hours as needed for pain.   aspirin 81 MG tablet Take 81 mg by mouth daily.   atorvastatin (LIPITOR) 10 MG tablet TAKE  1 TABLET EVERY DAY AT 6PM   Blood Glucose Monitoring Suppl (ONE TOUCH ULTRA 2) w/Device KIT Use to check sugars daily.   fluticasone (FLONASE) 50 MCG/ACT nasal spray Place 1 spray into both nostrils daily.   glipiZIDE (GLUCOTROL XL) 5 MG 24 hr tablet TAKE 1 TABLET BEFORE BREAKFAST AND 1 TABLET BEFORE SUPPER   glucose blood (ONETOUCH ULTRA) test strip 1 each by Other route daily. E11.9   guaiFENesin (MUCINEX) 600 MG 12 hr tablet Take 1,200 mg by mouth 2 (two) times daily as needed for cough or to loosen phlegm.    ibuprofen (ADVIL,MOTRIN) 100 MG tablet Take 100 mg by mouth every 6 (six) hours as needed for pain.    insulin degludec (TRESIBA FLEXTOUCH) 200 UNIT/ML FlexTouch Pen Inject 18-20 Units into the skin daily.   Insulin Pen Needle (BD PEN NEEDLE NANO 2ND GEN) 32G X 4 MM MISC USE EVERY DAY AS DIRECTED   Lancets (ONETOUCH ULTRASOFT) lancets Use as instructed check sugar one time daily.   metFORMIN (GLUCOPHAGE) 500 MG tablet TAKE 1 TABLET BY MOUTH 2 TIMES DAILY WITH A MEAL. (Patient taking differently: Take 1,000 mg by mouth daily.)   triamcinolone cream (KENALOG) 0.1 % APPLY  CREAM EXTERNALLY TO AFFECTED AREA TWICE DAILY   No facility-administered encounter medications on file as of 01/07/2023.    Allergies (verified) Codeine   History: Past Medical History:  Diagnosis Date   Allergic rhinitis    Allergy    Cataract    forming  Diabetes mellitus (HCC) 05/24/2014   History of kidney stones 04/19/1989   Hyperlipidemia    Morbid obesity (HCC) 03/17/2014   OBSTRUCTIVE SLEEP APNEA 02/22/2008   HST 2013:  AHI 51/hr.  Optimal pressure 12cm on autotitration.     OSA on CPAP    Sleep apnea    on cpap   Past Surgical History:  Procedure Laterality Date   COLONOSCOPY  2009   KNEE SURGERY     left   Family History  Problem Relation Age of Onset   Heart disease Mother    Gallstones Mother    Snoring Father    Esophageal varices Father    Diabetes Paternal Grandmother    Colon  cancer Neg Hx    Colon polyps Neg Hx    Esophageal cancer Neg Hx    Rectal cancer Neg Hx    Stomach cancer Neg Hx    Social History   Socioeconomic History   Marital status: Married    Spouse name: Not on file   Number of children: Not on file   Years of education: Not on file   Highest education level: Not on file  Occupational History   Occupation: sales  Tobacco Use   Smoking status: Never   Smokeless tobacco: Never  Substance and Sexual Activity   Alcohol use: No   Drug use: No   Sexual activity: Yes    Birth control/protection: None  Other Topics Concern   Not on file  Social History Narrative   Not on file   Social Determinants of Health   Financial Resource Strain: Low Risk  (01/07/2023)   Overall Financial Resource Strain (CARDIA)    Difficulty of Paying Living Expenses: Not hard at all  Food Insecurity: No Food Insecurity (01/07/2023)   Hunger Vital Sign    Worried About Running Out of Food in the Last Year: Never true    Ran Out of Food in the Last Year: Never true  Transportation Needs: No Transportation Needs (01/07/2023)   PRAPARE - Administrator, Civil Service (Medical): No    Lack of Transportation (Non-Medical): No  Physical Activity: Sufficiently Active (01/07/2023)   Exercise Vital Sign    Days of Exercise per Week: 5 days    Minutes of Exercise per Session: 40 min  Stress: No Stress Concern Present (01/07/2023)   [employer] of Occupational Health - Occupational Stress Questionnaire    Feeling of Stress : Not at all  Social Connections: Unknown (01/07/2023)   Social Connection and Isolation Panel [NHANES]    Frequency of Communication with Friends and Family: More than three times a week    Frequency of Social Gatherings with Friends and Family: Three times a week    Attends Religious Services: Not on file    Active Member of Clubs or Organizations: Yes    Attends Banker Meetings: More than 4 times per year     Marital Status: Married    Tobacco Counseling Counseling given: Not Answered   Clinical Intake:  Pre-visit preparation completed: Yes  Pain : No/denies pain Pain Score: 0-No pain     BMI - recorded: 38.15 (07/18/2022) Nutritional Status: BMI > 30  Obese Nutritional Risks: None Diabetes: Yes CBG done?: No Did pt. bring in CBG monitor from home?: No  How often do you need to have someone help you when you read instructions, pamphlets, or other written materials from your doctor or pharmacy?: 1 - Never What is the last  grade level you completed in school?: Retired from [employer]  Nutrition Risk Assessment:  Has the patient had any N/V/D within the last 2 months?  No  Does the patient have any non-healing wounds?  No  Has the patient had any unintentional weight loss or weight gain?  No   Diabetes:  Is the patient diabetic?  Yes  If diabetic, was a CBG obtained today?  No  Did the patient bring in their glucometer from home?  No  How often do you monitor your CBG's? Daily.   Financial Strains and Diabetes Management:  Are you having any financial strains with the device, your supplies or your medication? No .  Does the patient want to be seen by Chronic Care Management for management of their diabetes?  No  Would the patient like to be referred to a Nutritionist or for Diabetic Management?  No   Diabetic Exams:  Diabetic Eye Exam: Completed 07/22/2022 Diabetic Foot Exam: Completed 11/05/2021; patient is overdue.   Interpreter Needed?: No  Information entered by :: Elinor Kleine N. Opha Mcghee, LPN.   Activities of Daily Living    01/07/2023   10:02 AM 01/03/2023   12:55 PM  In your present state of health, do you have any difficulty performing the following activities:  Hearing? 0 0  Vision? 0 0  Difficulty concentrating or making decisions? 0 0  Walking or climbing stairs? 0 0  Dressing or bathing? 0 0  Doing errands, shopping? 0 0  Preparing Food and eating ? N N   Using the Toilet? N N  In the past six months, have you accidently leaked urine? N N  Do you have problems with loss of bowel control? N N  Managing your Medications? N N  Managing your Finances? N N  Housekeeping or managing your Housekeeping? N N    Patient Care Team: Georgina Quint, MD as PCP - General (Internal Medicine) Blima Ledger, OD as Consulting Physician (Optometry) Mia Creek, MD as Consulting Physician (Ophthalmology)  Indicate any recent Medical Services you may have received from other than [facility] providers in the past year (date may be approximate).     Assessment:   This is a routine wellness examination for Jacorius.  Hearing/Vision screen Hearing Screening - Comments:: Denies hearing difficulties.  No hearing aids.   Vision Screening - Comments:: Cataracts removed 12/02/2022 (left eye) and 12/23/2022 (right eye) by Dr. Mia Creek.  Patient is up to date with routine eye exams with Dr. Jimmye Norman.   Dietary issues and exercise activities discussed: Current Exercise Habits: Home exercise routine;Structured exercise class, Type of exercise: walking;treadmill;stretching;strength training/weights (belongs to gym), Time (Minutes): 40, Frequency (Times/Week): 5, Weekly Exercise (Minutes/Week): 200, Intensity: Moderate, Exercise limited by: None identified   Goals Addressed             This Visit's Progress    Weight Goal: <200 lbs.       My goal for 2024 is to increase my physical activity by walking more, going to the gym and losing weight.      Depression Screen    01/07/2023    9:58 AM 01/01/2022    9:27 AM 10/23/2021    2:05 PM 11/05/2019   12:46 PM 10/25/2019   10:37 AM 09/27/2019    9:29 AM 09/19/2018   11:27 AM  PHQ 2/9 Scores  PHQ - 2 Score 0 0 0 0 0 0 0  PHQ- 9 Score 0  Fall Risk    01/07/2023   10:01 AM 01/03/2023   12:55 PM 01/01/2022    9:26 AM 10/23/2021    2:23 PM 10/23/2021    2:05 PM  Fall Risk   Falls in the past year? 0 0  0 1 0  Number falls in past yr: 0  0 0 0  Injury with Fall? 0  0 1 0  Comment    shoulder injury   Risk for fall due to : No Fall Risks  No Fall Risks    Follow up Falls prevention discussed  Falls evaluation completed      FALL RISK PREVENTION PERTAINING TO THE HOME:  Any stairs in or around the home? No  If so, are there any without handrails? No  Home free of loose throw rugs in walkways, pet beds, electrical cords, etc? Yes  Adequate lighting in your home to reduce risk of falls? Yes   ASSISTIVE DEVICES UTILIZED TO PREVENT FALLS:  Life alert? Yes  Security alarms in home-Yes Use of a cane, walker or w/c? No  Grab bars in the bathroom? Yes  Shower chair or bench in shower? Yes  Elevated toilet seat or a handicapped toilet? No   TIMED UP AND GO:  Was the test performed? No . Telephonic Visit  Cognitive Function:        01/07/2023   10:03 AM 01/01/2022    9:35 AM 10/25/2019   10:35 AM  6CIT Screen  What Year? 0 points 0 points 0 points  What month? 0 points 0 points 0 points  What time? 0 points 0 points 0 points  Count back from 20 0 points 0 points 0 points  Months in reverse 0 points 0 points 0 points  Repeat phrase 0 points 0 points 0 points  Total Score 0 points 0 points 0 points    Immunizations Immunization History  Administered Date(s) Administered   Influenza Split 06/23/2012   Influenza Whole 05/20/2011   Influenza, High Dose Seasonal PF 05/19/2018   Influenza,inj,Quad PF,6+ Mos 07/07/2013, 05/24/2014   Influenza-Unspecified 08/29/2017, 08/03/2019   PFIZER(Purple Top)SARS-COV-2 Vaccination 10/10/2019, 12/21/2019   PNEUMOCOCCAL CONJUGATE-20 10/23/2021   Pfizer Covid-19 Vaccine Bivalent Booster 41yrs & up 06/26/2021   Tdap 01/18/2008    TDAP status: Due, Education has been provided regarding the importance of this vaccine. Advised may receive this vaccine at local pharmacy or Health Dept. Aware to provide a copy of the vaccination record if obtained  from local pharmacy or Health Dept. Verbalized acceptance and understanding.  Flu Vaccine status: Up to date  Pneumococcal vaccine status: Up to date  Covid-19 vaccine status: Completed vaccines  Qualifies for Shingles Vaccine? Yes   Zostavax completed No   Shingrix Completed?: No.    Education has been provided regarding the importance of this vaccine. Patient has been advised to call insurance company to determine out of pocket expense if they have not yet received this vaccine. Advised may also receive vaccine at local pharmacy or Health Dept. Verbalized acceptance and understanding.  Screening Tests Health Maintenance  Topic Date Due   Zoster Vaccines- Shingrix (1 of 2) Never done   DTaP/Tdap/Td (2 - Td or Tdap) 01/17/2018   COVID-19 Vaccine (4 - 2023-24 season) 04/19/2022   Diabetic kidney evaluation - eGFR measurement  10/24/2022   Diabetic kidney evaluation - Urine ACR  10/24/2022   FOOT EXAM  11/06/2022   HEMOGLOBIN A1C  01/16/2023   INFLUENZA VACCINE  03/20/2023   OPHTHALMOLOGY EXAM  07/23/2023   Medicare Annual Wellness (AWV)  01/07/2024   COLONOSCOPY (Pts 45-56yrs Insurance coverage will need to be confirmed)  03/22/2027   Pneumonia Vaccine 73+ Years old  Completed   Hepatitis C Screening  Completed   HPV VACCINES  Aged Out    Health Maintenance  Health Maintenance Due  Topic Date Due   Zoster Vaccines- Shingrix (1 of 2) Never done   DTaP/Tdap/Td (2 - Td or Tdap) 01/17/2018   COVID-19 Vaccine (4 - 2023-24 season) 04/19/2022   Diabetic kidney evaluation - eGFR measurement  10/24/2022   Diabetic kidney evaluation - Urine ACR  10/24/2022   FOOT EXAM  11/06/2022    Colorectal cancer screening: Type of screening: Colonoscopy. Completed 03/21/2022. Repeat every 5 years  Lung Cancer Screening: (Low Dose CT Chest recommended if Age 21-80 years, 30 pack-year currently smoking OR have quit w/in 15years.) does not qualify.   Lung Cancer Screening Referral:  no  Additional Screening:  Hepatitis C Screening: does qualify; Completed 04/16/2016  Vision Screening: Recommended annual ophthalmology exams for early detection of glaucoma and other disorders of the eye. Is the patient up to date with their annual eye exam?  Yes  Who is the provider or what is the name of the office in which the patient attends annual eye exams? [facility] If pt is not established with a provider, would they like to be referred to a provider to establish care? No .   Dental Screening: Recommended annual dental exams for proper oral hygiene  Community Resource Referral / Chronic Care Management: CRR required this visit?  No   CCM required this visit?  No      Plan:     I have personally reviewed and noted the following in the patient's chart:   Medical and social history Use of alcohol, tobacco or illicit drugs  Current medications and supplements including opioid prescriptions. Patient is not currently taking opioid prescriptions. Functional ability and status Nutritional status Physical activity Advanced directives List of other physicians Hospitalizations, surgeries, and ER visits in previous 12 months Vitals Screenings to include cognitive, depression, and falls Referrals and appointments  In addition, I have reviewed and discussed with patient certain preventive protocols, quality metrics, and best practice recommendations. A written personalized care plan for preventive services as well as general preventive health recommendations were provided to patient.     Mickeal Needy, LPN   01/21/5408   Nurse Notes:  Normal cognitive status assessed by direct observation telephone conversation by this Nurse Health Advisor. No abnormalities found.

## 2023-01-07 NOTE — Patient Instructions (Addendum)
Rodney Turner , Thank you for taking time to come for your Medicare Wellness Visit. I appreciate your ongoing commitment to your health goals. Please review the following plan we discussed and let me know if I can assist you in the future.   These are the goals we discussed:  Goals      Weight Goal: <200 lbs.     My goal for 2024 is to increase my physical activity by walking more, going to the gym and losing weight.        This is a list of the screening recommended for you and due dates:  Health Maintenance  Topic Date Due   Zoster (Shingles) Vaccine (1 of 2) Never done   DTaP/Tdap/Td vaccine (2 - Td or Tdap) 01/17/2018   COVID-19 Vaccine (4 - 2023-24 season) 04/19/2022   Yearly kidney function blood test for diabetes  10/24/2022   Yearly kidney health urinalysis for diabetes  10/24/2022   Complete foot exam   11/06/2022   Hemoglobin A1C  01/16/2023   Flu Shot  03/20/2023   Eye exam for diabetics  07/23/2023   Medicare Annual Wellness Visit  01/07/2024   Colon Cancer Screening  03/22/2027   Pneumonia Vaccine  Completed   Hepatitis C Screening: USPSTF Recommendation to screen - Ages 18-79 yo.  Completed   HPV Vaccine  Aged Out    Advanced directives: No; Advance directive discussed with you today. Even though you declined this today please call our office should you change your mind and we can give you the proper paperwork for you to fill out.  Conditions/risks identified: Yes; Type II Diabetes  Next appointment: Follow up in one year for your annual wellness visit.   Preventive Care 67 Years and Older, Male  Preventive care refers to lifestyle choices and visits with your health care provider that can promote health and wellness. What does preventive care include? A yearly physical exam. This is also called an annual well check. Dental exams once or twice a year. Routine eye exams. Ask your health care provider how often you should have your eyes checked. Personal  lifestyle choices, including: Daily care of your teeth and gums. Regular physical activity. Eating a healthy diet. Avoiding tobacco and drug use. Limiting alcohol use. Practicing safe sex. Taking low doses of aspirin every day. Taking vitamin and mineral supplements as recommended by your health care provider. What happens during an annual well check? The services and screenings done by your health care provider during your annual well check will depend on your age, overall health, lifestyle risk factors, and family history of disease. Counseling  Your health care provider may ask you questions about your: Alcohol use. Tobacco use. Drug use. Emotional well-being. Home and relationship well-being. Sexual activity. Eating habits. History of falls. Memory and ability to understand (cognition). Work and work Astronomer. Screening  You may have the following tests or measurements: Height, weight, and BMI. Blood pressure. Lipid and cholesterol levels. These may be checked every 5 years, or more frequently if you are over 32 years old. Skin check. Lung cancer screening. You may have this screening every year starting at age 72 if you have a 30-pack-year history of smoking and currently smoke or have quit within the past 15 years. Fecal occult blood test (FOBT) of the stool. You may have this test every year starting at age 25. Flexible sigmoidoscopy or colonoscopy. You may have a sigmoidoscopy every 5 years or a colonoscopy every 10 years starting  at age 20. Prostate cancer screening. Recommendations will vary depending on your family history and other risks. Hepatitis C blood test. Hepatitis B blood test. Sexually transmitted disease (STD) testing. Diabetes screening. This is done by checking your blood sugar (glucose) after you have not eaten for a while (fasting). You may have this done every 1-3 years. Abdominal aortic aneurysm (AAA) screening. You may need this if you are a  current or former smoker. Osteoporosis. You may be screened starting at age 80 if you are at high risk. Talk with your health care provider about your test results, treatment options, and if necessary, the need for more tests. Vaccines  Your health care provider may recommend certain vaccines, such as: Influenza vaccine. This is recommended every year. Tetanus, diphtheria, and acellular pertussis (Tdap, Td) vaccine. You may need a Td booster every 10 years. Zoster vaccine. You may need this after age 58. Pneumococcal 13-valent conjugate (PCV13) vaccine. One dose is recommended after age 53. Pneumococcal polysaccharide (PPSV23) vaccine. One dose is recommended after age 62. Talk to your health care provider about which screenings and vaccines you need and how often you need them. This information is not intended to replace advice given to you by your health care provider. Make sure you discuss any questions you have with your health care provider. Document Released: 09/01/2015 Document Revised: 04/24/2016 Document Reviewed: 06/06/2015 Elsevier Interactive Patient Education  2017 [employer].  Fall Prevention in the Home Falls can cause injuries. They can happen to people of all ages. There are many things you can do to make your home safe and to help prevent falls. What can I do on the outside of my home? Regularly fix the edges of walkways and driveways and fix any cracks. Remove anything that might make you trip as you walk through a door, such as a raised step or threshold. Trim any bushes or trees on the path to your home. Use bright outdoor lighting. Clear any walking paths of anything that might make someone trip, such as rocks or tools. Regularly check to see if handrails are loose or broken. Make sure that both sides of any steps have handrails. Any raised decks and porches should have guardrails on the edges. Have any leaves, snow, or ice cleared regularly. Use sand or salt on  walking paths during winter. Clean up any spills in your garage right away. This includes oil or grease spills. What can I do in the bathroom? Use night lights. Install grab bars by the toilet and in the tub and shower. Do not use towel bars as grab bars. Use non-skid mats or decals in the tub or shower. If you need to sit down in the shower, use a plastic, non-slip stool. Keep the floor dry. Clean up any water that spills on the floor as soon as it happens. Remove soap buildup in the tub or shower regularly. Attach bath mats securely with double-sided non-slip rug tape. Do not have throw rugs and other things on the floor that can make you trip. What can I do in the bedroom? Use night lights. Make sure that you have a light by your bed that is easy to reach. Do not use any sheets or blankets that are too big for your bed. They should not hang down onto the floor. Have a firm chair that has side arms. You can use this for support while you get dressed. Do not have throw rugs and other things on the floor that can  make you trip. What can I do in the kitchen? Clean up any spills right away. Avoid walking on wet floors. Keep items that you use a lot in easy-to-reach places. If you need to reach something above you, use a strong step stool that has a grab bar. Keep electrical cords out of the way. Do not use floor polish or wax that makes floors slippery. If you must use wax, use non-skid floor wax. Do not have throw rugs and other things on the floor that can make you trip. What can I do with my stairs? Do not leave any items on the stairs. Make sure that there are handrails on both sides of the stairs and use them. Fix handrails that are broken or loose. Make sure that handrails are as long as the stairways. Check any carpeting to make sure that it is firmly attached to the stairs. Fix any carpet that is loose or worn. Avoid having throw rugs at the top or bottom of the stairs. If you do  have throw rugs, attach them to the floor with carpet tape. Make sure that you have a light switch at the top of the stairs and the bottom of the stairs. If you do not have them, ask someone to add them for you. What else can I do to help prevent falls? Wear shoes that: Do not have high heels. Have rubber bottoms. Are comfortable and fit you well. Are closed at the toe. Do not wear sandals. If you use a stepladder: Make sure that it is fully opened. Do not climb a closed stepladder. Make sure that both sides of the stepladder are locked into place. Ask someone to hold it for you, if possible. Clearly mark and make sure that you can see: Any grab bars or handrails. First and last steps. Where the edge of each step is. Use tools that help you move around (mobility aids) if they are needed. These include: Canes. Walkers. Scooters. Crutches. Turn on the lights when you go into a dark area. Replace any light bulbs as soon as they burn out. Set up your furniture so you have a clear path. Avoid moving your furniture around. If any of your floors are uneven, fix them. If there are any pets around you, be aware of where they are. Review your medicines with your doctor. Some medicines can make you feel dizzy. This can increase your chance of falling. Ask your doctor what other things that you can do to help prevent falls. This information is not intended to replace advice given to you by your health care provider. Make sure you discuss any questions you have with your health care provider. Document Released: 06/01/2009 Document Revised: 01/11/2016 Document Reviewed: 09/09/2014 Elsevier Interactive Patient Education  2017 [employer].

## 2023-01-09 ENCOUNTER — Ambulatory Visit: Payer: Medicare Other | Admitting: Internal Medicine

## 2023-01-24 ENCOUNTER — Other Ambulatory Visit: Payer: Self-pay | Admitting: Internal Medicine

## 2023-02-15 ENCOUNTER — Other Ambulatory Visit: Payer: Self-pay | Admitting: Internal Medicine

## 2023-02-16 ENCOUNTER — Other Ambulatory Visit: Payer: Self-pay | Admitting: Internal Medicine

## 2023-02-16 DIAGNOSIS — E1122 Type 2 diabetes mellitus with diabetic chronic kidney disease: Secondary | ICD-10-CM

## 2023-05-06 ENCOUNTER — Ambulatory Visit: Payer: Medicare Other | Admitting: Internal Medicine

## 2023-05-08 ENCOUNTER — Encounter: Payer: Self-pay | Admitting: Internal Medicine

## 2023-05-08 ENCOUNTER — Ambulatory Visit: Payer: Medicare Other | Admitting: Internal Medicine

## 2023-05-08 VITALS — BP 124/80 | HR 78 | Ht 67.0 in | Wt 247.6 lb

## 2023-05-08 DIAGNOSIS — E1122 Type 2 diabetes mellitus with diabetic chronic kidney disease: Secondary | ICD-10-CM | POA: Diagnosis not present

## 2023-05-08 DIAGNOSIS — Z794 Long term (current) use of insulin: Secondary | ICD-10-CM | POA: Diagnosis not present

## 2023-05-08 DIAGNOSIS — Z7984 Long term (current) use of oral hypoglycemic drugs: Secondary | ICD-10-CM

## 2023-05-08 DIAGNOSIS — E785 Hyperlipidemia, unspecified: Secondary | ICD-10-CM

## 2023-05-08 LAB — POCT GLYCOSYLATED HEMOGLOBIN (HGB A1C): Hemoglobin A1C: 10 % — AB (ref 4.0–5.6)

## 2023-05-08 NOTE — Patient Instructions (Addendum)
Please continue: - Metformin 1000 mg at dinnertime - Glipizide ER 5 mg before b'fast  - Glipizide ER 5 mg before dinner (3 halves of a tablet)  Increase: - Tresiba U200 30 units daily (may need to increase to 34 units in 4 days)   Please return in 2 months with your sugar log, but let me know about the sugars in 1 month.

## 2023-05-08 NOTE — Progress Notes (Signed)
Today patient ID: Rodney Dubin., male   DOB: [birth year], 72 y.o.   MRN: [id number]   HPI: Rodney Botz. is a 72 y.o.-year-old male, initially referred by his PCP, Dr. Cleta Alberts, presenting for f/u for DM2, dx in 03/2016, insulin-dependent, uncontrolled, with complications (mild CKD). His wife, Rodney Turner, is also my pt. Last visit 10 months ago.  Interim history: No increased urination, nausea, chest pain.   He was walking  for exercise, but stopped after his cataract sx. 04-12/2022. Sugars have been higher, mostly in the 200s.  Reviewed history: In 01/2016, he started to feel very fatigued, frequent urination, nocturia, weight loss - saw PCP >> HbA1c very high (>14%). Prev. HbA1c levels were in the 6-7% range.  Reviewed HbA1c levels: Lab Results  Component Value Date   HGBA1C 6.5 (A) 07/18/2022   HGBA1C 6.1 (A) 03/14/2022   HGBA1C 11.1 (A) 09/21/2021   HGBA1C 7.9 (A) 12/25/2020   HGBA1C 7.3 (A) 12/31/2019   HGBA1C 5.9 (A) 05/19/2018   HGBA1C 5.9 11/17/2017   HGBA1C 5.5 05/19/2017   HGBA1C 5.9 01/15/2017   HGBA1C 5.6 10/15/2016   HGBA1C 5.7 07/24/2016   HGBA1C >14.0 04/16/2016   HGBA1C 6.6 01/20/2015   HGBA1C 6.3 05/24/2014   Pt is on a regimen of: - Metformin 1000 mg at dinnertime - Glipizide ER 5 mg before b'fast  - Glipizide ER 5 mg before dinner (3 halves of a tablet) - Tresiba U200 16 >> 20 >> 14-18 >> 24 >> 18-20 >> 24 units daily He contacted me in 07/2019 with high blood sugars.  I suggested Ozempic but he did not start this as he did not want to use injectables.  He also refused to start [place].  Pt checks his sugars 1-2 times a day: - am:  116-165, 188 >> 123-154, 178, 190 >> 117-171, 182 >> 200-260 - 2h after b'fast: n/c - before lunch: 100-131 >> 115-137, 171 >> <150 >> n/c - 2h after lunch: 230 (double cheeseburger and fries) >> n/c  - before dinner: 81-120 >> 89-150 >> 77-130, 150 >> 190 - 2h after dinner: n/c >> 120-159 >> n/c - bedtime: 149 >>  <180 >> 175, 180 >> n/c - nighttime: n/c Lowest sugar was 81 >> 89 >> 77 >> 140; he has hypoglycemia awareness in the 70s. Highest sugar was 300s >> .Marland KitchenMarland Kitchen182 >> 260.  Pt's meals are: - Breakfast: McD[employer]ds >> burrito - Lunch: grilled chicken sandwich - Dinner: Chick[employer]fil[employer]a or [employer]'s salad or meat + veggies + starch - Snacks: 1: chips, popcorn, fruit cups, fresh fruit >> cough drops, pork rinds He was previously going to the gym.  He has a treadmill.  -+ Mild CKD. Last BUN/creatinine:  Lab Results  Component Value Date   BUN 13 10/23/2021   BUN 17 12/31/2019   CREATININE 0.91 10/23/2021   CREATININE 0.98 12/31/2019   Lab Results  Component Value Date   MICRALBCREAT 1.5 10/23/2021   MICRALBCREAT 0.8 12/31/2019   MICRALBCREAT 0.6 05/19/2017   -+ HL; last set of lipids: Lab Results  Component Value Date   CHOL 116 10/23/2021   HDL 36.30 (L) 10/23/2021   LDLCALC 47 10/23/2021   TRIG 167.0 (H) 10/23/2021   CHOLHDL 3 10/23/2021  On Lipitor 10.  - last eye exam was 2024: No DR. + mild cataracts. Dr. Hyacinth Meeker. Had cataract sx in 04 and 12/2022.  -No numbness and tingling in his feet.  Last foot exam 11/05/2021.  He also has OSA -  on CPAP. He fell and fractured his left humerus on 11/26/2020.  He retired 05/2017.  ROS: + see HPI  I reviewed pt's medications, allergies, PMH, social hx, family hx, and changes were documented in the history of present illness. Otherwise, unchanged from my initial visit note.  Past Medical History:  Diagnosis Date   Allergic rhinitis    Allergy    Cataract    forming   Diabetes mellitus (HCC) 05/24/2014   History of kidney stones 04/19/1989   Hyperlipidemia    Morbid obesity (HCC) 03/17/2014   OBSTRUCTIVE SLEEP APNEA 02/22/2008   HST 2013:  AHI 51/hr.  Optimal pressure 12cm on autotitration.     OSA on CPAP    Sleep apnea    on cpap   Past Surgical History:  Procedure Laterality Date   COLONOSCOPY  2009   KNEE SURGERY     left    Social History   Social History   Marital status: Married    Spouse name: N/A   Number of children: 2   Occupational History   sales    Social History Main Topics   Smoking status: Never Smoker   Smokeless tobacco: Never Used   Alcohol use No   Drug use: No   Current Outpatient Medications on File Prior to Visit  Medication Sig Dispense Refill   acetaminophen (TYLENOL) 650 MG CR tablet Take 650 mg by mouth every 8 (eight) hours as needed for pain.     aspirin 81 MG tablet Take 81 mg by mouth daily.     atorvastatin (LIPITOR) 10 MG tablet TAKE 1 TABLET EVERY DAY AT 6PM 90 tablet 3   Blood Glucose Monitoring Suppl (ONE TOUCH ULTRA 2) w/Device KIT Use to check sugars daily. 1 each 0   fluticasone (FLONASE) 50 MCG/ACT nasal spray Place 1 spray into both nostrils daily.     gatifloxacin (ZYMAXID) 0.5 % SOLN Place 1 drop into the left eye 4 (four) times daily.     glipiZIDE (GLUCOTROL XL) 5 MG 24 hr tablet TAKE 1 TABLET BEFORE BREAKFAST AND 1 TABLET BEFORE SUPPER 180 tablet 3   guaiFENesin (MUCINEX) 600 MG 12 hr tablet Take 1,200 mg by mouth 2 (two) times daily as needed for cough or to loosen phlegm.      ibuprofen (ADVIL,MOTRIN) 100 MG tablet Take 100 mg by mouth every 6 (six) hours as needed for pain.      insulin degludec (TRESIBA FLEXTOUCH) 200 UNIT/ML FlexTouch Pen Inject 18-20 Units into the skin daily. 9 mL 3   Insulin Pen Needle (BD PEN NEEDLE NANO 2ND GEN) 32G X 4 MM MISC USE EVERY DAY AS DIRECTED 100 each 3   ketorolac (ACULAR) 0.5 % ophthalmic solution Place 1 drop into the right eye 4 (four) times daily.     Lancets (ONETOUCH ULTRASOFT) lancets Use as instructed check sugar one time daily. 100 each 6   metFORMIN (GLUCOPHAGE) 500 MG tablet TAKE 1 TABLET BY MOUTH TWICE A DAY WITH FOOD 180 tablet 3   ONETOUCH ULTRA test strip USE AS DIRECTED ONCE DAILY (E11.9) 100 strip 6   prednisoLONE acetate (PRED FORTE) 1 % ophthalmic suspension Place 1 drop into the left eye 4 (four)  times daily.     triamcinolone cream (KENALOG) 0.1 % APPLY  CREAM EXTERNALLY TO AFFECTED AREA TWICE DAILY 45 g 0   No current facility-administered medications on file prior to visit.   Allergies  Allergen Reactions   Codeine Other (See Comments)  REACTION: nausea   Family History  Problem Relation Age of Onset   Heart disease Mother    Gallstones Mother    Snoring Father    Esophageal varices Father    Diabetes Paternal Grandmother    Colon cancer Neg Hx    Colon polyps Neg Hx    Esophageal cancer Neg Hx    Rectal cancer Neg Hx    Stomach cancer Neg Hx    PE: BP 124/80   Pulse 78   Ht 5\' 7"  (1.702 m)   Wt 247 lb 9.6 oz (112.3 kg)   SpO2 95%   BMI 38.78 kg/m  Wt Readings from Last 3 Encounters:  05/08/23 247 lb 9.6 oz (112.3 kg)  07/18/22 243 lb 9.6 oz (110.5 kg)  03/21/22 242 lb (109.8 kg)   Constitutional: overweight, in NAD Eyes: EOMI, no exophthalmos ENT: no thyromegaly, no cervical lymphadenopathy Cardiovascular: RRR, No MRG Respiratory: CTA B Musculoskeletal: no deformities Skin: + rash (NLD?) on medial side of left lower leg Neurological: no tremor with outstretched hands Diabetic Foot Exam - Simple   Simple Foot Form Diabetic Foot exam was performed with the following findings: Yes 05/08/2023  2:32 PM  Visual Inspection No deformities, no ulcerations, no other skin breakdown bilaterally: Yes Sensation Testing Intact to touch and monofilament testing bilaterally: Yes Pulse Check Posterior Tibialis and Dorsalis pulse intact bilaterally: Yes Comments    ASSESSMENT: 1. DM2, insulin-dependent, uncontrolled, with complications -Mild CKD  2. HL  3. Obesity class 3  PLAN:  1. Patient with previously fairly well-controlled type 2 diabetes, on metformin and sulfonylurea only, but with deteriorating blood sugar control when he returns after long absence in 09/2021.  At that time, HbA1c was very high, at 11.1%.  We added long-acting insulin at that time.   Sugars improved afterwards and we did not change the regimen at last visit.  At that time, sugars appeared to be at goal before dinner but they were slightly higher in the morning.  I advised him to check some blood sugars at bedtime and to increase the dose of Tresiba if they were at goal also takes 3 tabs of glipizide before dinner if they were higher.  At that time, HbA1c was slightly higher, at 6.5%. -He now returns after a longer absence of 10 months.  His sugars are very high, after he stopped exercising around the time of his cataract surgeries 4 and 5 months ago.  They are in the 200s in the morning and slightly better later in the day, as he is occasionally walking, but still above target. -At today's visit we discussed about possible directions for treatment.  We could add a GLP-1 receptor agonist or increase his Tresiba dose for now, since the sugars are highest in the morning.  He opted to increase the [place] dose for now and continue to work on his diet and exercise. -I strongly recommended a CGM but he would like to hold off for now.  He does agree to discuss with his insurance to see which ones are covered. - I suggested to:  Patient Instructions  Please continue: - Metformin 1000 mg at dinnertime - Glipizide ER 5 mg before b'fast  - Glipizide ER 5 mg before dinner (3 halves of a tablet)  Increase: - Tresiba U200 30 units daily (may need to increase to 34 units in 4 days)   Please return in 2 months with your sugar log, but let me know about the sugars in 1  month.  - we checked his HbA1c: 10% (much higher) - advised to check sugars at different times of the day - 4x a day, rotating check times - advised for yearly eye exams >> he is UTD - will check annual labs today - return to clinic in 2 months  2. HL -Latest lipid panel from 10/2021: LDL at goal, HDL low, triglycerides slightly elevated: Lab Results  Component Value Date   CHOL 116 10/23/2021   HDL 36.30 (L) 10/23/2021    LDLCALC 47 10/23/2021   TRIG 167.0 (H) 10/23/2021   CHOLHDL 3 10/23/2021  -He continues on Lipitor 10 mg daily without side effects -will check a lipid panel today  3.  Obesity class 3 -He refused SGLT2 inhibitors and GLP-1 receptor agonists.  These would not help with weight loss -Weight was stable at last visit -At today's visit, he gained 4 pounds  Component     Latest Ref Rng 05/08/2023  Hemoglobin A1C     4.0 - 5.6 % 10.0 !   Microalb, Ur     0.0 - 1.9 mg/dL <9.5   Creatinine,U     mg/dL 621.3   MICROALB/CREAT RATIO     0.0 - 30.0 mg/g 0.7   Sodium     135 - 145 mEq/L 135   Potassium     3.5 - 5.1 mEq/L 4.5   Chloride     96 - 112 mEq/L 99   CO2     19 - 32 mEq/L 26   Glucose     70 - 99 mg/dL 086 (H)   BUN     6 - 23 mg/dL 22   Creatinine     5.78 - 1.50 mg/dL 4.69   Total Bilirubin     0.2 - 1.2 mg/dL 0.6   Alkaline Phosphatase     39 - 117 U/L 59   AST     0 - 37 U/L 18   ALT     0 - 53 U/L 20   Total Protein     6.0 - 8.3 g/dL 7.2   Albumin     3.5 - 5.2 g/dL 4.3   GFR     >62.95 mL/min 64.08   Calcium     8.4 - 10.5 mg/dL 9.4   Cholesterol     0 - 200 mg/dL 284   Triglycerides     0.0 - 149.0 mg/dL 132.4 (H)   HDL Cholesterol     >39.00 mg/dL 40.10 (L)   VLDL     0.0 - 40.0 mg/dL 27.2 (H)   LDL (calc)     0 - 99 mg/dL 39   Total CHOL/HDL Ratio 4   NonHDL 81.48   Triglycerides elevated, HDL low, LDL at goal.  Improving diet and restarting consistent exercise should help with this. Otherwise, labs are at goal with the exception of the high glucose and HbA1c.  Carlus Pavlov, MD PhD [facility] Endocrinology

## 2023-05-09 DIAGNOSIS — H20013 Primary iridocyclitis, bilateral: Secondary | ICD-10-CM | POA: Diagnosis not present

## 2023-05-09 LAB — COMPREHENSIVE METABOLIC PANEL
ALT: 20 U/L (ref 0–53)
AST: 18 U/L (ref 0–37)
Albumin: 4.3 g/dL (ref 3.5–5.2)
Alkaline Phosphatase: 59 U/L (ref 39–117)
BUN: 22 mg/dL (ref 6–23)
CO2: 26 mEq/L (ref 19–32)
Calcium: 9.4 mg/dL (ref 8.4–10.5)
Chloride: 99 mEq/L (ref 96–112)
Creatinine, Ser: 1.14 mg/dL (ref 0.40–1.50)
GFR: 64.08 mL/min (ref >60.00)
Glucose, Bld: 210 mg/dL — ABNORMAL HIGH (ref 70–99)
Potassium: 4.5 mEq/L (ref 3.5–5.1)
Sodium: 135 mEq/L (ref 135–145)
Total Bilirubin: 0.6 mg/dL (ref 0.2–1.2)
Total Protein: 7.2 g/dL (ref 6.0–8.3)

## 2023-05-09 LAB — LIPID PANEL
Cholesterol: 112 mg/dL (ref 0–200)
HDL: 30.2 mg/dL — ABNORMAL LOW (ref >39.00)
LDL Cholesterol: 39 mg/dL (ref 0–99)
NonHDL: 81.48
Total CHOL/HDL Ratio: 4
Triglycerides: 214 mg/dL — ABNORMAL HIGH (ref 0.0–149.0)
VLDL: 42.8 mg/dL — ABNORMAL HIGH (ref 0.0–40.0)

## 2023-05-09 LAB — MICROALBUMIN / CREATININE URINE RATIO
Creatinine,U: 101 mg/dL
Microalb Creat Ratio: 0.7 mg/g (ref 0.0–30.0)
Microalb, Ur: <0.7 mg/dL (ref 0.0–1.9)

## 2023-06-06 ENCOUNTER — Other Ambulatory Visit: Payer: Self-pay | Admitting: Internal Medicine

## 2023-06-06 DIAGNOSIS — E119 Type 2 diabetes mellitus without complications: Secondary | ICD-10-CM

## 2023-07-08 ENCOUNTER — Ambulatory Visit: Payer: Medicare Other | Admitting: Internal Medicine

## 2023-07-08 ENCOUNTER — Encounter: Payer: Self-pay | Admitting: Internal Medicine

## 2023-07-08 VITALS — BP 130/70 | HR 58 | Ht 67.0 in | Wt 248.4 lb

## 2023-07-08 DIAGNOSIS — E785 Hyperlipidemia, unspecified: Secondary | ICD-10-CM | POA: Diagnosis not present

## 2023-07-08 DIAGNOSIS — E1122 Type 2 diabetes mellitus with diabetic chronic kidney disease: Secondary | ICD-10-CM

## 2023-07-08 DIAGNOSIS — Z7984 Long term (current) use of oral hypoglycemic drugs: Secondary | ICD-10-CM | POA: Diagnosis not present

## 2023-07-08 LAB — POCT GLYCOSYLATED HEMOGLOBIN (HGB A1C): Hemoglobin A1C: 7.8 % — AB (ref 4.0–5.6)

## 2023-07-08 NOTE — Patient Instructions (Addendum)
Please continue: - Metformin 1000 mg at dinnertime - Glipizide ER 5 mg before b'fast  - Glipizide ER 5 mg before dinner (3 halves of a tablet)  Try to increase: - Tresiba U200 34 units daily    Please return in 3 months.

## 2023-07-08 NOTE — Progress Notes (Signed)
Today patient ID: Rodney Dubin., male   DOB: May 29, 1951, 72 y.o.   MRN: [id number]   HPI: Rodney Castorina. is a 72 y.o.-year-old male, initially referred by his PCP, Dr. Cleta Alberts, presenting for f/u for DM2, dx in 03/2016, insulin-dependent, uncontrolled, with complications (mild CKD). His wife, Rodney Turner, is also my pt. Last visit 2 months ago.  Interim history: No increased urination, nausea, chest pain.  His blurry vision completely resolved after cataract surgery in spring 2024.  Reviewed history: In 01/2016, he started to feel very fatigued, frequent urination, nocturia, weight loss - saw PCP >> HbA1c very high (>14%). Prev. HbA1c levels were in the 6-7% range.  Reviewed HbA1c levels: Lab Results  Component Value Date   HGBA1C 10.0 (A) 05/08/2023   HGBA1C 6.5 (A) 07/18/2022   HGBA1C 6.1 (A) 03/14/2022   HGBA1C 11.1 (A) 09/21/2021   HGBA1C 7.9 (A) 12/25/2020   HGBA1C 7.3 (A) 12/31/2019   HGBA1C 5.9 (A) 05/19/2018   HGBA1C 5.9 11/17/2017   HGBA1C 5.5 05/19/2017   HGBA1C 5.9 01/15/2017   HGBA1C 5.6 10/15/2016   HGBA1C 5.7 07/24/2016   HGBA1C >14.0 04/16/2016   HGBA1C 6.6 01/20/2015   HGBA1C 6.3 05/24/2014   Pt is on a regimen of: - Metformin 1000 mg at dinnertime - Glipizide ER 5 mg before b'fast  - Glipizide ER 5 mg before dinner (3 halves of a tablet) - Tresiba U200 16 >> 20 >> 14-18 >> 24 >> 18-20 >> 24 >> 30 units daily He contacted me in 07/2019 with high blood sugars.  I suggested Ozempic but he did not start this as he did not want to use injectables.  He also refused to start [place].  Pt checks his sugars 1-2 times a day: - am: 123-154, 178, 190 >> 117-171, 182 >> 200-260 >> 132-200, 224 - 2h after b'fast: n/c - before lunch: 100-131 >> 115-137, 171 >> <150 >> n/c >> 121-180 - 2h after lunch: 230 (double cheeseburger and fries) >> n/c  - before dinner: 81-120 >> 89-150 >> 77-130, 150 >> 190 >> 117-133, 155 - 2h after dinner: n/c >> 120-159 >> n/c -  bedtime: 149 >> <180 >> 175, 180 >> n/c >> 94, 124-170, 180 - nighttime: n/c Lowest sugar was 77 >> 140 >> 94; he has hypoglycemia awareness in the 70s. Highest sugar was 300s >> .Marland KitchenMarland Kitchen182 >> 260 >> 224  Pt's meals are: - Breakfast: McD[employer] >> burrito - Lunch: grilled chicken sandwich - Dinner: [employer] or [employer]'s salad or meat + veggies + starch - Snacks: 1: chips, popcorn, fruit cups, fresh fruit >> cough drops, pork rinds He was previously going to the gym.  He has a treadmill.  -+ Mild CKD. Last BUN/creatinine:  Lab Results  Component Value Date   BUN 22 05/08/2023   BUN 13 10/23/2021   CREATININE 1.14 05/08/2023   CREATININE 0.91 10/23/2021   Lab Results  Component Value Date   MICRALBCREAT 0.7 05/08/2023   MICRALBCREAT 1.5 10/23/2021   MICRALBCREAT 0.8 12/31/2019   MICRALBCREAT 0.6 05/19/2017   -+ HL; last set of lipids: Lab Results  Component Value Date   CHOL 112 05/08/2023   HDL 30.20 (L) 05/08/2023   LDLCALC 39 05/08/2023   TRIG 214.0 (H) 05/08/2023   CHOLHDL 4 05/08/2023  On Lipitor 10.  - last eye exam was 04/2023: No DR. + mild cataracts. Dr. Hyacinth Meeker. Had cataract sx in 04 and 12/2022.  -No numbness and tingling in his feet.  Last foot exam 05/08/2023.  He also has OSA - on CPAP. He fell and fractured his left humerus on 11/26/2020.  He retired 05/2017.  ROS: + see HPI  I reviewed pt's medications, allergies, PMH, social hx, family hx, and changes were documented in the history of present illness. Otherwise, unchanged from my initial visit note.  Past Medical History:  Diagnosis Date   Allergic rhinitis    Allergy    Cataract    forming   Diabetes mellitus (HCC) 05/24/2014   History of kidney stones 04/19/1989   Hyperlipidemia    Morbid obesity (HCC) 03/17/2014   OBSTRUCTIVE SLEEP APNEA 02/22/2008   HST 2013:  AHI 51/hr.  Optimal pressure 12cm on autotitration.     OSA on CPAP    Sleep apnea    on cpap   Past Surgical History:  Procedure  Laterality Date   COLONOSCOPY  2009   KNEE SURGERY     left   Social History   Social History   Marital status: Married    Spouse name: N/A   Number of children: 2   Occupational History   sales    Social History Main Topics   Smoking status: Never Smoker   Smokeless tobacco: Never Used   Alcohol use No   Drug use: No   Current Outpatient Medications on File Prior to Visit  Medication Sig Dispense Refill   acetaminophen (TYLENOL) 650 MG CR tablet Take 650 mg by mouth every 8 (eight) hours as needed for pain.     aspirin 81 MG tablet Take 81 mg by mouth daily.     atorvastatin (LIPITOR) 10 MG tablet TAKE 1 TABLET EVERY DAY AT 6PM 90 tablet 3   Blood Glucose Monitoring Suppl (ONE TOUCH ULTRA 2) w/Device KIT Use to check sugars daily. 1 each 0   fluticasone (FLONASE) 50 MCG/ACT nasal spray Place 1 spray into both nostrils daily.     gatifloxacin (ZYMAXID) 0.5 % SOLN Place 1 drop into the left eye 4 (four) times daily.     glipiZIDE (GLUCOTROL XL) 5 MG 24 hr tablet TAKE 1 TABLET BEFORE BREAKFAST AND 1 TABLET BEFORE SUPPER 180 tablet 3   guaiFENesin (MUCINEX) 600 MG 12 hr tablet Take 1,200 mg by mouth 2 (two) times daily as needed for cough or to loosen phlegm.      ibuprofen (ADVIL,MOTRIN) 100 MG tablet Take 100 mg by mouth every 6 (six) hours as needed for pain.      insulin degludec (TRESIBA FLEXTOUCH) 200 UNIT/ML FlexTouch Pen Inject 18-20 Units into the skin daily. (Patient taking differently: Inject 18-20 Units into the skin daily. Inject 24 units) 9 mL 3   Insulin Pen Needle (BD PEN NEEDLE NANO 2ND GEN) 32G X 4 MM MISC USE EVERY DAY AS DIRECTED 100 each 3   ketorolac (ACULAR) 0.5 % ophthalmic solution Place 1 drop into the right eye 4 (four) times daily.     Lancets (ONETOUCH ULTRASOFT) lancets Use as instructed check sugar one time daily. 100 each 6   metFORMIN (GLUCOPHAGE) 500 MG tablet TAKE 1 TABLET BY MOUTH TWICE A DAY WITH FOOD 180 tablet 3   ONETOUCH ULTRA test strip USE  AS DIRECTED ONCE DAILY (E11.9) 100 strip 6   prednisoLONE acetate (PRED FORTE) 1 % ophthalmic suspension Place 1 drop into the left eye 4 (four) times daily.     triamcinolone cream (KENALOG) 0.1 % APPLY  CREAM EXTERNALLY TO AFFECTED AREA TWICE DAILY 45 g 0  No current facility-administered medications on file prior to visit.   Allergies  Allergen Reactions   Codeine Other (See Comments)    REACTION: nausea   Family History  Problem Relation Age of Onset   Heart disease Mother    Gallstones Mother    Snoring Father    Esophageal varices Father    Diabetes Paternal Grandmother    Colon cancer Neg Hx    Colon polyps Neg Hx    Esophageal cancer Neg Hx    Rectal cancer Neg Hx    Stomach cancer Neg Hx    PE: BP 130/70   Pulse (!) 58   Ht 5\' 7"  (1.702 m)   Wt 248 lb 6.4 oz (112.7 kg)   SpO2 94%   BMI 38.90 kg/m  Wt Readings from Last 3 Encounters:  07/08/23 248 lb 6.4 oz (112.7 kg)  05/08/23 247 lb 9.6 oz (112.3 kg)  07/18/22 243 lb 9.6 oz (110.5 kg)   Constitutional: overweight, in NAD Eyes: EOMI, no exophthalmos ENT: no thyromegaly, no cervical lymphadenopathy Cardiovascular: RRR, No MRG Respiratory: CTA B Musculoskeletal: no deformities Skin: + rash (NLD?) on medial side of left lower leg Neurological: no tremor with outstretched hands  ASSESSMENT: 1. DM2, insulin-dependent, uncontrolled, with complications -Mild CKD  2. HL  3. Obesity class 3  PLAN:  1. Patient with previously well-controlled type 2 diabetes, on metformin and sulfonylurea after refusing GLP-1 receptor agonist and SGLT2 inhibitors, who returns at last visit after a longer absence, now 10 months.  Sugars are very high after stopping exercise around the time of his cataract surgeries, 4 to 5 months prior.  HbA1c was also much higher, at 10%.  At that time, we increased the dose of [place] and I strongly recommended exercise.  He declined a GLP receptor agonist at that time.  I recommended a CGM.  He  wanted to discuss with insurance to see which one was covered. -At today's visit, sugars are better, still increasing in the morning above target, but lower than before.  The sugars improve as the day goes by.  We discussed that this is usually a sign of not enough basal insulin.  I suggested to increase Tresiba dose but continued the rest of the regimen.  He tells me that he was not able to obtain the CGM but he decided that he would hold off for now. - I suggested to:  Patient Instructions  Please continue: - Metformin 1000 mg at dinnertime - Glipizide ER 5 mg before b'fast  - Glipizide ER 5 mg before dinner (3 halves of a tablet)  Try to increase: - Tresiba U200 34 units daily    Please return in 3 months.  - we checked his HbA1c: 7.8% (much better) - advised to check sugars at different times of the day - 1x a day, rotating check times - advised for yearly eye exams >> he is UTD - return to clinic in 3 months  2. HL -Reviewed latest lipid panel from last visit: LDL at goal, HDL low, triglycerides high: Lab Results  Component Value Date   CHOL 112 05/08/2023   HDL 30.20 (L) 05/08/2023   LDLCALC 39 05/08/2023   TRIG 214.0 (H) 05/08/2023   CHOLHDL 4 05/08/2023  -Continues Lipitor 10 mg daily without side effects  3.  Obesity class 3 -He previously refused SGLT2 inhibitors and GLP-1 receptor agonist.  These would have helped with weight loss. -At last visit, he gained 4 pounds and gained 1  lb since then  Carlus Pavlov, MD PhD [facility] Endocrinology

## 2023-08-06 ENCOUNTER — Other Ambulatory Visit: Payer: Self-pay | Admitting: Internal Medicine

## 2023-09-24 ENCOUNTER — Encounter (HOSPITAL_BASED_OUTPATIENT_CLINIC_OR_DEPARTMENT_OTHER): Payer: Self-pay | Admitting: Pulmonary Disease

## 2023-09-24 ENCOUNTER — Ambulatory Visit (HOSPITAL_BASED_OUTPATIENT_CLINIC_OR_DEPARTMENT_OTHER): Payer: Medicare Other | Admitting: Pulmonary Disease

## 2023-09-24 VITALS — BP 138/74 | HR 85 | Ht 67.0 in | Wt 252.0 lb

## 2023-09-24 DIAGNOSIS — G4733 Obstructive sleep apnea (adult) (pediatric): Secondary | ICD-10-CM

## 2023-09-24 DIAGNOSIS — E119 Type 2 diabetes mellitus without complications: Secondary | ICD-10-CM

## 2023-09-24 NOTE — Patient Instructions (Signed)
 Rx for replacement CPAP 12 cm CPAP supplies AirFit P10 nasal pillows

## 2023-09-24 NOTE — Addendum Note (Signed)
 Addended by: Jaquayla Hege L on: 09/24/2023 03:12 PM   Modules accepted: Orders

## 2023-09-24 NOTE — Progress Notes (Signed)
 Epworth Sleepiness Scale  Use the following scale to choose the most appropriate number for each situation. 0 Would never nod off 1  Slight  chance of nodding off 2 Moderate chance of nodding off 3 High chance of nodding off  Sitting and reading: 0 Watching TV: 1 Sitting, inactive, in a public place (e.g., in a meeting, theater, or dinner event): 0 As a passenger in a car for an hour or more without stopping for a break: 0 Lying down to rest when circumstances permit:3 Sitting and talking to someone: 0 Sitting quietly after a meal without alcohol: 1 In a car, while stopped for a few  minutes in traffic or at a light: 0  TOTOAL: 5

## 2023-09-24 NOTE — Progress Notes (Signed)
 Subjective:    Patient ID: Rodney Turner., male    DOB: [birth year], 73 y.o.   MRN: [id number]  HPI 73 year old diabetic presents to establish care for OSA  Last seen 08/2019 >> virtual visit  PMH : DM-2  Discussed the use of AI scribe software for clinical note transcription with the patient, who gave verbal consent to proceed.  History of Present Illness   The patient, with a history of sleep apnea and diabetes, presents for a follow-up visit. The patient's CPAP machine, which was prescribed in 2013, recently stopped working. The patient has been managing his sleep apnea with this machine for nearly a decade and reports feeling rested upon waking when using the machine. The patient typically sleeps from around midnight to 8 am, waking once to use the bathroom.  In addition to sleep apnea, the patient also has diabetes, which is managed with Tresiba  and Metformin . The patient reports fluctuations in blood sugar levels, attributing these changes to dietary habits and lack of exercise. A couple of years ago, the patient experienced a significant weight loss of about 18 pounds when his blood sugar was high. Despite regaining some of the weight, the patient has managed to maintain a lower weight than before the loss.     Epworth sleep score is 5. Bedtime is around midnight, sleep latency is minimal, he is out of bed at 8 AM feeling rested without dryness of mouth or headaches.  He has settled down with nasal pillows. CPAP download was reviewed which shows excellent control of events on 12 cm with large leak and excellent usage more than 7 hours per night without a single missed night  Significant tests/ events reviewed   HST 2013:  AHI 51/hr.  Optimal pressure 12cm on autotitration.   Past Medical History:  Diagnosis Date   Allergic rhinitis    Allergy    Cataract    forming   Diabetes mellitus (HCC) 05/24/2014   History of kidney stones 04/19/1989   Hyperlipidemia    Morbid  obesity (HCC) 03/17/2014   OBSTRUCTIVE SLEEP APNEA 02/22/2008   HST 2013:  AHI 51/hr.  Optimal pressure 12cm on autotitration.     OSA on CPAP    Sleep apnea    on cpap    Past Surgical History:  Procedure Laterality Date   COLONOSCOPY  2009   KNEE SURGERY     left    Allergies  Allergen Reactions   Codeine Other (See Comments)    REACTION: nausea    Social History   Socioeconomic History   Marital status: Married    Spouse name: Not on file   Number of children: Not on file   Years of education: Not on file   Highest education level: Not on file  Occupational History   Occupation: sales  Tobacco Use   Smoking status: Never   Smokeless tobacco: Never  Substance and Sexual Activity   Alcohol use: No   Drug use: No   Sexual activity: Yes    Birth control/protection: None  Other Topics Concern   Not on file  Social History Narrative   Not on file   Social Drivers of Health   Financial Resource Strain: Low Risk  (01/07/2023)   Overall Financial Resource Strain (CARDIA)    Difficulty of Paying Living Expenses: Not hard at all  Food Insecurity: No Food Insecurity (01/07/2023)   Hunger Vital Sign    Worried About Running Out of Food in the Last  Year: Never true    Ran Out of Food in the Last Year: Never true  Transportation Needs: No Transportation Needs (01/07/2023)   PRAPARE - Administrator, Civil Service (Medical): No    Lack of Transportation (Non-Medical): No  Physical Activity: Sufficiently Active (01/07/2023)   Exercise Vital Sign    Days of Exercise per Week: 5 days    Minutes of Exercise per Session: 40 min  Stress: No Stress Concern Present (01/07/2023)   [employer] of Occupational Health - Occupational Stress Questionnaire    Feeling of Stress : Not at all  Social Connections: Unknown (01/07/2023)   Social Connection and Isolation Panel [NHANES]    Frequency of Communication with Friends and Family: More than three times a week     Frequency of Social Gatherings with Friends and Family: Three times a week    Attends Religious Services: Not on file    Active Member of Clubs or Organizations: Yes    Attends Club or Organization Meetings: More than 4 times per year    Marital Status: Married  Intimate Partner Violence: Not At Risk (01/07/2023)   Humiliation, Afraid, Rape, and Kick questionnaire    Fear of Current or Ex-Partner: No    Emotionally Abused: No    Physically Abused: No    Sexually Abused: No    Family History  Problem Relation Age of Onset   Heart disease Mother    Gallstones Mother    Snoring Father    Esophageal varices Father    Diabetes Paternal Grandmother    Colon cancer Neg Hx    Colon polyps Neg Hx    Esophageal cancer Neg Hx    Rectal cancer Neg Hx    Stomach cancer Neg Hx      Review of Systems Constitutional: negative for anorexia, fevers and sweats  Eyes: negative for irritation, redness and visual disturbance  Ears, nose, mouth, throat, and face: negative for earaches, epistaxis, nasal congestion and sore throat  Respiratory: negative for cough, dyspnea on exertion, sputum and wheezing  Cardiovascular: negative for chest pain, dyspnea, lower extremity edema, orthopnea, palpitations and syncope  Gastrointestinal: negative for abdominal pain, constipation, diarrhea, melena, nausea and vomiting  Genitourinary:negative for dysuria, frequency and hematuria  Hematologic/lymphatic: negative for bleeding, easy bruising and lymphadenopathy  Musculoskeletal:negative for arthralgias, muscle weakness and stiff joints  Neurological: negative for coordination problems, gait problems, headaches and weakness  Endocrine: negative for diabetic symptoms including polydipsia, polyuria and weight loss     Objective:   Physical Exam  Gen. Pleasant, obese, in no distress, normal affect ENT - no pallor,icterus, no post nasal drip, class 2-3 airway Neck: No JVD, no thyromegaly, no carotid  bruits Lungs: no use of accessory muscles, no dullness to percussion, decreased without rales or rhonchi  Cardiovascular: Rhythm regular, heart sounds  normal, no murmurs or gallops, no peripheral edema Abdomen: soft and non-tender, no hepatosplenomegaly, BS normal. Musculoskeletal: No deformities, no cyanosis or clubbing Neuro:  alert, non focal, no tremors       Assessment & Plan:    Assessment and Plan    Obstructive Sleep Apnea Diagnosed in 2013. Last visit in 2021. Current CPAP machine malfunctioned. Reports good compliance with CPAP therapy until machine failure. Feels rested with CPAP. Minor leakage with nasal cannula. Events per hour are 1.6, within acceptable range. Discussed switching to nasal pillows (P10) for better fit and comfort. Prefers nasal pillows. - Send prescription for new CPAP machine with nasal pillows (  P10) - Follow up in one month to ensure proper function and efficacy of the new CPAP machine  Type 2 Diabetes Mellitus Good control with Tresiba  pen (34 units daily). HbA1c improved from 11 to 7.3. Discussed newer medications like Ozempic  and Mounjaro for weight loss and glycemic control. Prefers to avoid new medications but open to discussing with primary care physician. - Discuss potential switch to Ozempic  with primary care physician - Continue current diabetes management and lifestyle modifications    Follow-up - Follow up with primary care physician to discuss Ozempic  and potential medication adjustments - Annual follow-up with pulmonology for sleep apnea management.

## 2023-10-01 ENCOUNTER — Other Ambulatory Visit: Payer: Self-pay | Admitting: Emergency Medicine

## 2023-10-01 DIAGNOSIS — R21 Rash and other nonspecific skin eruption: Secondary | ICD-10-CM

## 2023-10-06 ENCOUNTER — Ambulatory Visit: Payer: Medicare Other | Admitting: Internal Medicine

## 2023-11-08 ENCOUNTER — Other Ambulatory Visit: Payer: Self-pay | Admitting: Internal Medicine

## 2023-11-25 ENCOUNTER — Ambulatory Visit: Payer: Medicare Other | Admitting: Internal Medicine

## 2023-11-28 ENCOUNTER — Encounter: Payer: Self-pay | Admitting: Emergency Medicine

## 2023-12-04 NOTE — Telephone Encounter (Signed)
 Only with a  blood test.

## 2023-12-18 ENCOUNTER — Other Ambulatory Visit: Payer: Self-pay | Admitting: Emergency Medicine

## 2023-12-18 DIAGNOSIS — R21 Rash and other nonspecific skin eruption: Secondary | ICD-10-CM

## 2023-12-18 MED ORDER — TRIAMCINOLONE ACETONIDE 0.1 % EX CREA
TOPICAL_CREAM | CUTANEOUS | 3 refills | Status: AC
Start: 2023-12-18 — End: ?

## 2024-01-08 ENCOUNTER — Ambulatory Visit (INDEPENDENT_AMBULATORY_CARE_PROVIDER_SITE_OTHER): Payer: Medicare Other

## 2024-01-08 VITALS — Ht 67.0 in | Wt 246.0 lb

## 2024-01-08 DIAGNOSIS — Z0000 Encounter for general adult medical examination without abnormal findings: Secondary | ICD-10-CM | POA: Diagnosis not present

## 2024-01-08 NOTE — Progress Notes (Signed)
 Subjective:   Rodney Turner. is a 73 y.o. who presents for a Medicare Wellness preventive visit.  As a reminder, Annual Wellness Visits don't include a physical exam, and some assessments may be limited, especially if this visit is performed virtually. We may recommend an in-person follow-up visit with your provider if needed.  Visit Complete: Virtual I connected with  Keghan E Dart Jr. on 01/08/24 by a audio enabled telemedicine application and verified that I am speaking with the correct person using two identifiers.  Patient Location: Home  Provider Location: Office/Clinic  I discussed the limitations of evaluation and management by telemedicine. The patient expressed understanding and agreed to proceed.  Vital Signs: Because this visit was a virtual/telehealth visit, some criteria may be missing or patient reported. Any vitals not documented were not able to be obtained and vitals that have been documented are patient reported.  VideoDeclined- This patient declined Librarian, academic. Therefore the visit was completed with audio only.  Persons Participating in Visit: Patient.  AWV Questionnaire: Yes: Patient Medicare AWV questionnaire was completed by the patient on 01/07/2024; I have confirmed that all information answered by patient is correct and no changes since this date.  Cardiac Risk Factors include: advanced age (>74men, >53 women);diabetes mellitus;male gender;dyslipidemia;obesity (BMI >30kg/m2);Other (see comment), Risk factor comments: OSA     Objective:     Today's Vitals   01/08/24 1010  Weight: 246 lb (111.6 kg)  Height: 5\' 7"  (1.702 m)   Body mass index is 38.53 kg/m.     01/08/2024   10:23 AM 01/07/2023    9:59 AM 01/01/2022    9:24 AM 10/25/2019   10:34 AM  Advanced Directives  Does Patient Have a Medical Advance Directive? No No No No  Would patient like information on creating a medical advance directive?  No -  Patient declined No - Patient declined Yes (ED - Information included in AVS)    Current Medications (verified) Outpatient Encounter Medications as of 01/08/2024  Medication Sig   acetaminophen  (TYLENOL ) 650 MG CR tablet Take 650 mg by mouth every 8 (eight) hours as needed for pain.   aspirin 81 MG tablet Take 81 mg by mouth daily.   atorvastatin  (LIPITOR) 10 MG tablet TAKE 1 TABLET EVERY DAY AT 6PM   Blood Glucose Monitoring Suppl (ONE TOUCH ULTRA 2) w/Device KIT Use to check sugars daily.   fluticasone  (FLONASE ) 50 MCG/ACT nasal spray Place 1 spray into both nostrils daily.   glipiZIDE  (GLUCOTROL  XL) 5 MG 24 hr tablet TAKE 1 TABLET BEFORE BREAKFAST AND 1 TABLET BEFORE SUPPER   guaiFENesin (MUCINEX) 600 MG 12 hr tablet Take 1,200 mg by mouth 2 (two) times daily as needed for cough or to loosen phlegm.    ibuprofen (ADVIL,MOTRIN) 100 MG tablet Take 100 mg by mouth every 6 (six) hours as needed for pain.    insulin  degludec (TRESIBA  FLEXTOUCH) 200 UNIT/ML FlexTouch Pen Inject 30-34 Units into the skin daily. Inject 30 units daily   Insulin  Pen Needle (BD PEN NEEDLE NANO 2ND GEN) 32G X 4 MM MISC USE EVERY DAY AS DIRECTED   Lancets (ONETOUCH ULTRASOFT) lancets Use as instructed check sugar one time daily.   metFORMIN  (GLUCOPHAGE ) 500 MG tablet TAKE 1 TABLET BY MOUTH TWICE A DAY WITH FOOD   ONETOUCH ULTRA test strip USE AS DIRECTED ONCE DAILY (E11.9)   triamcinolone  cream (KENALOG ) 0.1 % APPLY  CREAM EXTERNALLY TO AFFECTED AREA TWICE DAILY   No  facility-administered encounter medications on file as of 01/08/2024.    Allergies (verified) Codeine   History: Past Medical History:  Diagnosis Date   Allergic rhinitis    Allergy    Cataract    forming   Diabetes mellitus (HCC) 05/24/2014   History of kidney stones 04/19/1989   Hyperlipidemia    Morbid obesity (HCC) 03/17/2014   OBSTRUCTIVE SLEEP APNEA 02/22/2008   HST 2013:  AHI 51/hr.  Optimal pressure 12cm on autotitration.     OSA on  CPAP    Sleep apnea    on cpap   Past Surgical History:  Procedure Laterality Date   COLONOSCOPY  2009   KNEE SURGERY     left   Family History  Problem Relation Age of Onset   Heart disease Mother    Gallstones Mother    Snoring Father    Esophageal varices Father    Diabetes Paternal Grandmother    Colon cancer Neg Hx    Colon polyps Neg Hx    Esophageal cancer Neg Hx    Rectal cancer Neg Hx    Stomach cancer Neg Hx    Social History   Socioeconomic History   Marital status: Married    Spouse name: Ursula Gardner   Number of children: 2   Years of education: Not on file   Highest education level: Bachelor's degree (e.g., BA, AB, BS)  Occupational History   Occupation: Airline pilot   Occupation: RETIRED  Tobacco Use   Smoking status: Never   Smokeless tobacco: Never  Vaping Use   Vaping status: Never Used  Substance and Sexual Activity   Alcohol use: No   Drug use: No   Sexual activity: Yes    Birth control/protection: None  Other Topics Concern   Not on file  Social History Narrative   Lives with wife and 1 dog/2025   Social Drivers of Health   Financial Resource Strain: Low Risk  (01/07/2024)   Overall Financial Resource Strain (CARDIA)    Difficulty of Paying Living Expenses: Not hard at all  Food Insecurity: No Food Insecurity (01/07/2024)   Hunger Vital Sign    Worried About Running Out of Food in the Last Year: Never true    Ran Out of Food in the Last Year: Never true  Transportation Needs: No Transportation Needs (01/07/2024)   PRAPARE - Administrator, Civil Service (Medical): No    Lack of Transportation (Non-Medical): No  Physical Activity: Sufficiently Active (01/07/2024)   Exercise Vital Sign    Days of Exercise per Week: 5 days    Minutes of Exercise per Session: 30 min  Stress: No Stress Concern Present (01/07/2024)   [employer] of Occupational Health - Occupational Stress Questionnaire    Feeling of Stress : Not at all  Social  Connections: Socially Integrated (01/07/2024)   Social Connection and Isolation Panel [NHANES]    Frequency of Communication with Friends and Family: More than three times a week    Frequency of Social Gatherings with Friends and Family: Three times a week    Attends Religious Services: More than 4 times per year    Active Member of Clubs or Organizations: Yes    Attends Engineer, structural: More than 4 times per year    Marital Status: Married    Tobacco Counseling Counseling given: Not Answered    Clinical Intake:  Pre-visit preparation completed: Yes  Pain : No/denies pain     BMI - recorded: 38.53  Nutritional Status: BMI > 30  Obese Nutritional Risks: None Diabetes: Yes CBG done?: Yes (per pt-161-fasting) CBG resulted in Enter/ Edit results?: No Did pt. bring in CBG monitor from home?: No  Lab Results  Component Value Date   HGBA1C 7.8 (A) 07/08/2023   HGBA1C 10.0 (A) 05/08/2023   HGBA1C 6.5 (A) 07/18/2022     How often do you need to have someone help you when you read instructions, pamphlets, or other written materials from your doctor or pharmacy?: 1 - Never  Interpreter Needed?: No  Information entered by :: Tavari Loadholt, RMA   Activities of Daily Living     01/07/2024    1:41 PM  In your present state of health, do you have any difficulty performing the following activities:  Hearing? 0  Vision? 0  Difficulty concentrating or making decisions? 0  Walking or climbing stairs? 0  Dressing or bathing? 0  Doing errands, shopping? 0  Preparing Food and eating ? N  Using the Toilet? N  In the past six months, have you accidently leaked urine? N  Do you have problems with loss of bowel control? N  Managing your Medications? N  Managing your Finances? N  Housekeeping or managing your Housekeeping? N    Patient Care Team: Elvira Hammersmith, MD as PCP - General (Internal Medicine) Princella Brooklyn, OD as Consulting Physician  (Optometry) Ronni Colace, MD as Consulting Physician (Ophthalmology)  Indicate any recent Medical Services you may have received from other than [facility] providers in the past year (date may be approximate).     Assessment:    This is a routine wellness examination for Kemper.  Hearing/Vision screen Hearing Screening - Comments:: Denies hearing difficulties   Vision Screening - Comments:: Denies vision issues./Cataract surgery/Dr. Frosty Jews    Goals Addressed             This Visit's Progress    Weight Goal: <200 lbs.   On track    My goal for 2024 is to increase my physical activity by walking more, going to the gym and losing weight.       Depression Screen     01/08/2024   10:25 AM 01/07/2023    9:58 AM 01/01/2022    9:27 AM 10/23/2021    2:05 PM 11/05/2019   12:46 PM 10/25/2019   10:37 AM 09/27/2019    9:29 AM  PHQ 2/9 Scores  PHQ - 2 Score 0 0 0 0 0 0 0  PHQ- 9 Score 0 0         Fall Risk     01/07/2024    1:41 PM 01/07/2023   10:01 AM 01/03/2023   12:55 PM 01/01/2022    9:26 AM 10/23/2021    2:23 PM  Fall Risk   Falls in the past year? 0 0 0 0 1  Number falls in past yr: 0 0  0 0  Injury with Fall? 0 0  0 1  Comment     shoulder injury  Risk for fall due to :  No Fall Risks  No Fall Risks   Follow up Falls evaluation completed;Falls prevention discussed Falls prevention discussed  Falls evaluation completed     MEDICARE RISK AT HOME:  Medicare Risk at Home Any stairs in or around the home?: (Patient-Rptd) Yes If so, are there any without handrails?: (Patient-Rptd) No Home free of loose throw rugs in walkways, pet beds, electrical cords, etc?: (Patient-Rptd) Yes Adequate lighting in your home  to reduce risk of falls?: (Patient-Rptd) Yes Life alert?: (Patient-Rptd) No Use of a cane, walker or w/c?: (Patient-Rptd) No Grab bars in the bathroom?: (Patient-Rptd) Yes Shower chair or bench in shower?: (Patient-Rptd) Yes Elevated toilet seat or a handicapped  toilet?: (Patient-Rptd) No  TIMED UP AND GO:  Was the test performed?  No  Cognitive Function: Declined/Normal: No cognitive concerns noted by patient or family. Patient alert, oriented, able to answer questions appropriately and recall recent events. No signs of memory loss or confusion.        01/07/2023   10:03 AM 01/01/2022    9:35 AM 10/25/2019   10:35 AM  6CIT Screen  What Year? 0 points 0 points 0 points  What month? 0 points 0 points 0 points  What time? 0 points 0 points 0 points  Count back from 20 0 points 0 points 0 points  Months in reverse 0 points 0 points 0 points  Repeat phrase 0 points 0 points 0 points  Total Score 0 points 0 points 0 points    Immunizations Immunization History  Administered Date(s) Administered   Fluad Quad(high Dose 65+) 06/24/2022   Influenza Inj Mdck Quad Pf 08/29/2017   Influenza Split 06/23/2012   Influenza Whole 05/20/2011   Influenza, High Dose Seasonal PF 05/19/2018, 06/26/2021   Influenza,inj,Quad PF,6+ Mos 07/07/2013, 05/24/2014   Influenza-Unspecified 08/29/2017, 08/03/2019   Moderna Covid-19 Fall Seasonal Vaccine 36yrs & older 06/24/2022   PFIZER(Purple Top)SARS-COV-2 Vaccination 10/10/2019, 12/21/2019   PNEUMOCOCCAL CONJUGATE-20 10/23/2021   Pfizer Covid-19 Vaccine Bivalent Booster 60yrs & up 06/26/2021   Tdap 01/18/2008    Screening Tests Health Maintenance  Topic Date Due   Zoster Vaccines- Shingrix (1 of 2) Never done   DTaP/Tdap/Td (2 - Td or Tdap) 01/17/2018   COVID-19 Vaccine (5 - 2024-25 season) 04/20/2023   OPHTHALMOLOGY EXAM  07/23/2023   HEMOGLOBIN A1C  01/05/2024   INFLUENZA VACCINE  03/19/2024   Diabetic kidney evaluation - eGFR measurement  05/07/2024   Diabetic kidney evaluation - Urine ACR  05/07/2024   FOOT EXAM  05/07/2024   Medicare Annual Wellness (AWV)  01/07/2025   Colonoscopy  03/22/2027   Pneumonia Vaccine 31+ Years old  Completed   Hepatitis C Screening  Completed   HPV VACCINES  Aged Out    Meningococcal B Vaccine  Aged Out    Health Maintenance  Health Maintenance Due  Topic Date Due   Zoster Vaccines- Shingrix (1 of 2) Never done   DTaP/Tdap/Td (2 - Td or Tdap) 01/17/2018   COVID-19 Vaccine (5 - 2024-25 season) 04/20/2023   OPHTHALMOLOGY EXAM  07/23/2023   HEMOGLOBIN A1C  01/05/2024   Health Maintenance Items Addressed: A1C, See Nurse Notes  Additional Screening:  Vision Screening: Recommended annual ophthalmology exams for early detection of glaucoma and other disorders of the eye.  Dental Screening: Recommended annual dental exams for proper oral hygiene  Community Resource Referral / Chronic Care Management: CRR required this visit?  No   CCM required this visit?  No   Plan:    I have personally reviewed and noted the following in the patient's chart:   Medical and social history Use of alcohol, tobacco or illicit drugs  Current medications and supplements including opioid prescriptions. Patient is not currently taking opioid prescriptions. Functional ability and status Nutritional status Physical activity Advanced directives List of other physicians Hospitalizations, surgeries, and ER visits in previous 12 months Vitals Screenings to include cognitive, depression, and falls Referrals and appointments  In addition, I have reviewed and discussed with patient certain preventive protocols, quality metrics, and best practice recommendations. A written personalized care plan for preventive services as well as general preventive health recommendations were provided to patient.   Davionna Blacksher L Mykal Batiz, CMA   01/08/2024   After Visit Summary: (MyChart) Due to this being a telephonic visit, the after visit summary with patients personalized plan was offered to patient via MyChart   Notes: Please refer to Routing Comments.

## 2024-01-08 NOTE — Patient Instructions (Signed)
 Mr. Slowey , Thank you for taking time out of your busy schedule to complete your Annual Wellness Visit with me. I enjoyed our conversation and look forward to speaking with you again next year. I, as well as your care team,  appreciate your ongoing commitment to your health goals. Please review the following plan we discussed and let me know if I can assist you in the future. Your Game plan/ To Do List   Follow up Visits: Next Medicare AWV with our clinical staff: 01/13/2025.   Have you seen your provider in the last 6 months (3 months if uncontrolled diabetes)? Yes Next Office Visit with your provider: Patient stated that he will call office back after he is finished with his vacation.   Clinician Recommendations:  Aim for 30 minutes of exercise or brisk walking, 6-8 glasses of water, and 5 servings of fruits and vegetables each day. You are due for a Shingles vaccine, a tetanus vaccine and an A1C check. Remember to call Dr. Annabell Key and schedule a diabetic eye exam, soon.      This is a list of the screening recommended for you and due dates:  Health Maintenance  Topic Date Due   Zoster (Shingles) Vaccine (1 of 2) Never done   DTaP/Tdap/Td vaccine (2 - Td or Tdap) 01/17/2018   COVID-19 Vaccine (6 - 2024-25 season) 04/20/2023   Eye exam for diabetics  07/23/2023   Hemoglobin A1C  01/05/2024   Flu Shot  03/19/2024   Yearly kidney function blood test for diabetes  05/07/2024   Yearly kidney health urinalysis for diabetes  05/07/2024   Complete foot exam   05/07/2024   Medicare Annual Wellness Visit  01/07/2025   Colon Cancer Screening  03/22/2027   Pneumonia Vaccine  Completed   Hepatitis C Screening  Completed   HPV Vaccine  Aged Out   Meningitis B Vaccine  Aged Out    Advanced directives: (Declined) Advance directive discussed with you today. Even though you declined this today, please call our office should you change your mind, and we can give you the proper paperwork for you to  fill out. Advance Care Planning is important because it:  [x]  Makes sure you receive the medical care that is consistent with your values, goals, and preferences  [x]  It provides guidance to your family and loved ones and reduces their decisional burden about whether or not they are making the right decisions based on your wishes.  Follow the link provided in your after visit summary or read over the paperwork we have mailed to you to help you started getting your Advance Directives in place. If you need assistance in completing these, please reach out to us  so that we can help you!  See attachments for Preventive Care and Fall Prevention Tips.

## 2024-01-14 ENCOUNTER — Ambulatory Visit (INDEPENDENT_AMBULATORY_CARE_PROVIDER_SITE_OTHER)

## 2024-01-14 ENCOUNTER — Ambulatory Visit (INDEPENDENT_AMBULATORY_CARE_PROVIDER_SITE_OTHER): Admitting: Emergency Medicine

## 2024-01-14 ENCOUNTER — Ambulatory Visit: Payer: Self-pay | Admitting: Emergency Medicine

## 2024-01-14 ENCOUNTER — Encounter: Payer: Self-pay | Admitting: Emergency Medicine

## 2024-01-14 VITALS — BP 132/78 | HR 66 | Temp 97.9°F | Ht 67.0 in | Wt 248.0 lb

## 2024-01-14 DIAGNOSIS — M79671 Pain in right foot: Secondary | ICD-10-CM | POA: Insufficient documentation

## 2024-01-14 DIAGNOSIS — R600 Localized edema: Secondary | ICD-10-CM | POA: Diagnosis not present

## 2024-01-14 NOTE — Patient Instructions (Signed)
 Foot Pain Many things can cause foot pain. Common causes include injuries to the foot. The injuries include sprains or broken bones, or injuries that affect the nerves in the feet. Other causes of foot pain include arthritis, blisters, and bunions. To know what causes your foot pain, your health care provider will take a detailed history of your symptoms. They will also do a physical exam as well as imaging tests, such as X-ray or MRI. Follow these instructions at home: Managing pain, stiffness, and swelling  If told, put ice on the painful area. Put ice in a plastic bag. Place a towel between your skin and the bag. Leave the ice on for 20 minutes, 2-3 times a day. If your skin turns bright red, remove the ice right away to prevent skin damage. The risk of damage is higher if you cannot feel pain, heat, or cold. Activity Do not stand or walk for long periods. Do stretches to relieve foot pain and stiffness as told by your provider. Do not lift anything that is heavier than 10 lb (4.5 kg), or the limit that you are told, until your provider says that it is safe. Lifting a lot of weight can put added pressure on your feet. Return to your normal activities as told by your provider. Ask your provider what activities are safe for you. Lifestyle Wear comfortable, supportive shoes that fit you well. Do not wear high heels. Keep your feet clean and dry. General instructions Take over-the-counter and prescription medicines only as told by your provider. Rub your foot gently. Pay attention to any changes in your symptoms. Let your provider know if symptoms become worse. Keep all follow-up visits. Your provider will want to monitor your progress. Contact a health care provider if: Your pain does not get better after a few days of treatment at home. Your pain gets worse. You cannot stand on your foot. Your foot or toes are swollen. Your foot is numb or tingling. Get help right away if: Your foot  or toes turn white or blue. You have warmth and redness along your foot. This information is not intended to replace advice given to you by your health care provider. Make sure you discuss any questions you have with your health care provider. Document Revised: 08/29/2022 Document Reviewed: 05/07/2022 Elsevier Patient Education  2024 [employer].

## 2024-01-14 NOTE — Assessment & Plan Note (Signed)
 Mild tenderness and erythema on physical examination Recommend x-ray today.  Will review images when available Pain management discussed Clinically stable.  No signs of gout. Differential diagnosis discussed. No red flag signs or symptoms.  No concerns.

## 2024-01-14 NOTE — Progress Notes (Signed)
 Shane Darling. 73 y.o.   Chief Complaint  Patient presents with   Foot Pain    Patient is having Right foot pain for a couple of weeks. He does not remember if he injured it. Patient does have some swelling, he is taking ibuprofen as needed and putting some pain cream. Hurts most when he walks on it     HISTORY OF PRESENT ILLNESS: Acute problem visit today This is a 73 y.o. male complaining of pain to right distal foot for couple weeks.  Denies injury. Denies any other associated symptoms.  Foot Pain Pertinent negatives include no abdominal pain, chest pain, congestion, coughing, fever, headaches, nausea, rash, sore throat or vomiting.     Prior to Admission medications   Medication Sig Start Date End Date Taking? Authorizing Provider  acetaminophen  (TYLENOL ) 650 MG CR tablet Take 650 mg by mouth every 8 (eight) hours as needed for pain.    [provider]  aspirin 81 MG tablet Take 81 mg by mouth daily.    [provider]  atorvastatin  (LIPITOR) 10 MG tablet TAKE 1 TABLET EVERY DAY AT 6PM 06/06/23   Emilie Harden, MD  Blood Glucose Monitoring Suppl (ONE TOUCH ULTRA 2) w/Device KIT Use to check sugars daily. 06/03/16   Emilie Harden, MD  fluticasone  (FLONASE ) 50 MCG/ACT nasal spray Place 1 spray into both nostrils daily.    [provider]  glipiZIDE  (GLUCOTROL  XL) 5 MG 24 hr tablet TAKE 1 TABLET BEFORE BREAKFAST AND 1 TABLET BEFORE SUPPER 11/10/23   Emilie Harden, MD  guaiFENesin (MUCINEX) 600 MG 12 hr tablet Take 1,200 mg by mouth 2 (two) times daily as needed for cough or to loosen phlegm.     [provider]  ibuprofen (ADVIL,MOTRIN) 100 MG tablet Take 100 mg by mouth every 6 (six) hours as needed for pain.     [provider]  insulin  degludec (TRESIBA  FLEXTOUCH) 200 UNIT/ML FlexTouch Pen Inject 30-34 Units into the skin daily. Inject 30 units daily 08/06/23   Emilie Harden, MD  Insulin  Pen Needle (BD PEN NEEDLE  NANO 2ND GEN) 32G X 4 MM MISC USE EVERY DAY AS DIRECTED 12/25/22   Emilie Harden, MD  Lancets [facility] ULTRASOFT) lancets Use as instructed check sugar one time daily. 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  Optimal pressure 12cm on autotitration.     OSA on CPAP  Sleep apnea    on cpap    Past Surgical History:  Procedure Laterality Date   COLONOSCOPY  2009   KNEE SURGERY     left    Social History   Socioeconomic History   Marital status: Married    Spouse name: Ursula Gardner   Number of children: 2   Years of education: Not on file   Highest education level: Bachelor's degree (e.g., BA, AB, BS)  Occupational History   Occupation: Airline pilot   Occupation: RETIRED  Tobacco Use   Smoking status: Never   Smokeless tobacco: Never  Vaping Use   Vaping status: Never Used  Substance and Sexual Activity   Alcohol use: No   Drug use: No   Sexual activity: Yes    Birth control/protection: None  Other Topics Concern   Not  on file  Social History Narrative   Lives with wife and 1 dog/2025   Social Drivers of Health   Financial Resource Strain: Low Risk  (01/07/2024)   Overall Financial Resource Strain (CARDIA)    Difficulty of Paying Living Expenses: Not hard at all  Food Insecurity: No Food Insecurity (01/07/2024)   Hunger Vital Sign    Worried About Running Out of Food in the Last Year: Never true    Ran Out of Food in the Last Year: Never true  Transportation Needs: No Transportation Needs (01/07/2024)   PRAPARE - Administrator, Civil Service (Medical): No    Lack of Transportation (Non-Medical): No  Physical Activity: Sufficiently Active (01/07/2024)   Exercise Vital Sign    Days of Exercise per Week: 5 days    Minutes of Exercise per Session: 30 min  Stress: No Stress Concern Present (01/07/2024)   [employer] of Occupational Health - Occupational Stress Questionnaire    Feeling of Stress : Not at all  Social Connections: Socially Integrated (01/07/2024)   Social Connection and Isolation Panel [NHANES]    Frequency of Communication with Friends and Family: More than three times a week    Frequency of Social Gatherings with Friends and Family: Three times a week    Attends Religious Services: More than 4 times per year    Active Member of Clubs or Organizations: Yes    Attends Banker Meetings: More than 4 times per year    Marital Status: Married  Catering manager Violence: Not At Risk (01/08/2024)   Humiliation, Afraid, Rape, and Kick questionnaire    Fear of Current or Ex-Partner: No    Emotionally Abused: No    Physically Abused: No    Sexually Abused: No    Family History  Problem Relation Age of Onset   Heart disease Mother    Gallstones Mother    Snoring Father    Esophageal varices Father    Diabetes Paternal Grandmother    Colon cancer Neg Hx    Colon polyps Neg Hx    Esophageal cancer Neg Hx    Rectal cancer Neg Hx    Stomach cancer Neg Hx       Review of Systems  Constitutional: Negative.  Negative for fever.  HENT: Negative.  Negative for congestion and sore throat.   Respiratory: Negative.  Negative for cough and shortness of breath.   Cardiovascular: Negative.  Negative for chest pain and palpitations.  Gastrointestinal:  Negative for abdominal pain, nausea and vomiting.  Genitourinary: Negative.  Negative for dysuria and hematuria.  Skin: Negative.  Negative for rash.  Neurological:  Negative  for dizziness and headaches.  All other systems reviewed and are negative.   Vitals:   01/14/24 1418  BP: 132/78  Pulse: 66  Temp: 97.9 F (36.6 C)  SpO2: 95%    Physical Exam Vitals reviewed.  Constitutional:      Appearance: Normal appearance.  HENT:     Head: Normocephalic.  Eyes:     Extraocular Movements: Extraocular movements intact.  Cardiovascular:     Rate and Rhythm: Normal rate.  Pulmonary:     Effort: Pulmonary effort is normal.  Musculoskeletal:     Comments: Right foot: Warm to touch.  Good distal sensation.  Good distal pulses.  Excellent capillary refill.  Mild swelling and mild erythema to the distal 3rd and 4th metatarsal area.  Mild tenderness to touch.  No signs of gout.  Skin:    General: Skin is warm and dry.  Neurological:     Mental Status: He is alert and oriented to person, place, and time.  Psychiatric:        Mood and Affect: Mood normal.        Behavior: Behavior normal.      ASSESSMENT & PLAN: A total of 33 minutes was spent with the patient and counseling/coordination of care regarding preparing for this visit, review of most recent office visit notes, review of chronic medical conditions under management, review of all medications, differential diagnosis of right foot pain, review of x-ray images done today, pain management, prognosis, documentation and need for follow-up if no better or worse during the next several days or weeks.  Problem List Items Addressed This Visit        Other   Right foot pain - Primary   Mild tenderness and erythema on physical examination Recommend x-ray today.  Will review images when available Pain management discussed Clinically stable.  No signs of gout. Differential diagnosis discussed. No red flag signs or symptoms.  No concerns.      Relevant Orders   DG Foot Complete Right   Patient Instructions  Foot Pain Many things can cause foot pain. Common causes include injuries to the foot. The injuries include sprains or broken bones, or injuries that affect the nerves in the feet. Other causes of foot pain include arthritis, blisters, and bunions. To know what causes your foot pain, your health care provider will take a detailed history of your symptoms. They will also do a physical exam as well as imaging tests, such as X-ray or MRI. Follow these instructions at home: Managing pain, stiffness, and swelling  If told, put ice on the painful area. Put ice in a plastic bag. Place a towel between your skin and the bag. Leave the ice on for 20 minutes, 2-3 times a day. If your skin turns bright red, remove the ice right away to prevent skin damage. The risk of damage is higher if you cannot feel pain, heat, or cold. Activity Do not stand or walk for long periods. Do stretches to relieve foot pain and stiffness as told by your provider. Do not lift anything that is heavier than 10 lb (4.5 kg), or the limit that you are told, until your provider says that it is safe. Lifting a lot of weight can put added pressure on your feet. Return to your normal activities as told by your provider. Ask your provider what activities are safe for you. Lifestyle Wear comfortable, supportive shoes that fit you well. Do not wear high heels. Keep your feet clean and  dry. General instructions Take over-the-counter and prescription medicines only as told by your provider. Rub your foot gently. Pay attention to any changes in your symptoms. Let  your provider know if symptoms become worse. Keep all follow-up visits. Your provider will want to monitor your progress. Contact a health care provider if: Your pain does not get better after a few days of treatment at home. Your pain gets worse. You cannot stand on your foot. Your foot or toes are swollen. Your foot is numb or tingling. Get help right away if: Your foot or toes turn white or blue. You have warmth and redness along your foot. This information is not intended to replace advice given to you by your health care provider. Make sure you discuss any questions you have with your health care provider. Document Revised: 08/29/2022 Document Reviewed: 05/07/2022 Elsevier Patient Education  2024 Elsevier Inc.    Maryagnes Small, MD [facility]

## 2024-01-19 ENCOUNTER — Ambulatory Visit: Payer: Self-pay

## 2024-01-19 NOTE — Telephone Encounter (Signed)
  Chief Complaint: insect bite right ankle Symptoms: redness, itching, becoming more red Frequency: x1 Pertinent Negatives: Patient denies swelling Disposition: [] ED /[] Urgent Care (no appt availability in office) / [x] Appointment(In office/virtual)/ []  [facility]/ [] Home Care/ [] Refused Recommended Disposition /[] [facility]/ []  Follow-up with PCP Additional Notes: insect bite, please check for infection, appointment scheduled Copied from CRM [id number]. Topic: Clinical - Red Word Triage >> Jan 19, 2024  4:25 PM Rodney Turner wrote: Red Word that prompted transfer to Nurse Triage: Patient says something bit him on his ankle, right leg. Area is itchy and red, says it is going purple. Reason for Disposition  Bite starts to look bad (e.g., blister, purplish skin, ulcer)  (Exception: There is just minor swelling or small red bump.)  Answer Assessment - Initial Assessment Questions 1. TYPE of INSECT: "What type of insect was it?"      unknown 2. ONSET: "When did you get bitten?"      Approx. 5 days ago 3. LOCATION: "Where is the insect bite located?"      Right ankle 4. REDNESS: "Is the area red or pink?" If Yes, ask: "What size is area of redness?" (inches or cm). "When did the redness start?"     Dark red, about 2 inch x 3/4 inch 5. PAIN: "Is there any pain?" If Yes, ask: "How bad is it?"  (Scale 1-10; or mild, moderate, severe)     no 6. ITCHING: "Does it itch?" If Yes, ask: "How bad is the itch?"    - MILD: doesn't interfere with normal activities   - MODERATE-SEVERE: interferes with work, school, sleep, or other activities      Mild itching has resolved, used caladryl, then switched to neosporin after scratching 7. SWELLING: "How big is the swelling?" (inches, cm, or compare to coins)     no 8. OTHER SYMPTOMS: "Do you have any other symptoms?"  (e.g., difficulty breathing, hives)     no  Protocols used: Insect Bite-A-AH

## 2024-01-21 ENCOUNTER — Ambulatory Visit (INDEPENDENT_AMBULATORY_CARE_PROVIDER_SITE_OTHER): Admitting: Emergency Medicine

## 2024-01-21 ENCOUNTER — Encounter: Payer: Self-pay | Admitting: Emergency Medicine

## 2024-01-21 VITALS — BP 126/78 | HR 62 | Temp 98.0°F | Ht 67.0 in | Wt 253.0 lb

## 2024-01-21 DIAGNOSIS — S80861A Insect bite (nonvenomous), right lower leg, initial encounter: Secondary | ICD-10-CM

## 2024-01-21 DIAGNOSIS — L089 Local infection of the skin and subcutaneous tissue, unspecified: Secondary | ICD-10-CM | POA: Diagnosis not present

## 2024-01-21 DIAGNOSIS — W57XXXA Bitten or stung by nonvenomous insect and other nonvenomous arthropods, initial encounter: Secondary | ICD-10-CM | POA: Diagnosis not present

## 2024-01-21 MED ORDER — CEFADROXIL 500 MG PO CAPS: 500.0000 mg | ORAL_CAPSULE | Freq: Two times a day (BID) | ORAL | 0 refills | Status: AC

## 2024-01-21 NOTE — Progress Notes (Signed)
 Rodney Turner. 73 y.o.   Chief Complaint  Patient presents with   Insect Bite    Patient states he got the bug bite on right ankle happened last Thursday or Friday. Noticed this happening since he found out he was a diabetic. He does states it itchy, has redness, not tender to the touch.      HISTORY OF PRESENT ILLNESS: This is a 73 y.o. male complaining of infected insect bite to right ankle area No other complaints or medical concerns today.  HPI   Prior to Admission medications   Medication Sig Start Date End Date Taking? Authorizing Provider  acetaminophen  (TYLENOL ) 650 MG CR tablet Take 650 mg by mouth every 8 (eight) hours as needed for pain.   Yes [provider]  aspirin 81 MG tablet Take 81 mg by mouth daily.   Yes [provider]  atorvastatin  (LIPITOR) 10 MG tablet TAKE 1 TABLET EVERY DAY AT 6PM 06/06/23  Yes Emilie Harden, MD  Blood Glucose Monitoring Suppl (ONE TOUCH ULTRA 2) w/Device KIT Use to check sugars daily. 06/03/16  Yes Emilie Harden, MD  fluticasone  (FLONASE ) 50 MCG/ACT nasal spray Place 1 spray into both nostrils daily.   Yes [provider]  glipiZIDE  (GLUCOTROL  XL) 5 MG 24 hr tablet TAKE 1 TABLET BEFORE BREAKFAST AND 1 TABLET BEFORE SUPPER 11/10/23  Yes Emilie Harden, MD  guaiFENesin (MUCINEX) 600 MG 12 hr tablet Take 1,200 mg by mouth 2 (two) times daily as needed for cough or to loosen phlegm.    Yes [provider]  ibuprofen (ADVIL,MOTRIN) 100 MG tablet Take 100 mg by mouth every 6 (six) hours as needed for pain.    Yes [provider]  insulin  degludec (TRESIBA  FLEXTOUCH) 200 UNIT/ML FlexTouch Pen Inject 30-34 Units into the skin daily. Inject 30 units daily 08/06/23  Yes Emilie Harden, MD  Insulin  Pen Needle (BD PEN NEEDLE NANO 2ND GEN) 32G X 4 MM MISC USE EVERY DAY AS DIRECTED 12/25/22  Yes Emilie Harden, MD  Lancets [facility] ULTRASOFT) lancets Use as instructed check sugar one time  daily. 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  Optimal pressure 12cm on autotitration.     OSA on CPAP    Sleep apnea    on cpap    Past Surgical History:  Procedure Laterality Date   COLONOSCOPY  2009  KNEE SURGERY     left    Social History   Socioeconomic History   Marital status: Married    Spouse name: Ursula Gardner   Number of children: 2   Years of education: Not on file   Highest education level: Bachelor's degree (e.g., BA, AB, BS)  Occupational History   Occupation: Airline pilot   Occupation: RETIRED  Tobacco Use   Smoking status: Never   Smokeless tobacco: Never  Vaping Use   Vaping status: Never Used  Substance and Sexual Activity   Alcohol use: No   Drug use: No   Sexual activity: Yes    Birth control/protection: None  Other Topics Concern   Not on file  Social History Narrative   Lives with wife and 1 dog/2025   Social Drivers of Health   Financial Resource  Strain: Low Risk  (01/07/2024)   Overall Financial Resource Strain (CARDIA)    Difficulty of Paying Living Expenses: Not hard at all  Food Insecurity: No Food Insecurity (01/07/2024)   Hunger Vital Sign    Worried About Running Out of Food in the Last Year: Never true    Ran Out of Food in the Last Year: Never true  Transportation Needs: No Transportation Needs (01/07/2024)   PRAPARE - Administrator, Civil Service (Medical): No    Lack of Transportation (Non-Medical): No  Physical Activity: Sufficiently Active (01/07/2024)   Exercise Vital Sign    Days of Exercise per Week: 5 days    Minutes of Exercise per Session: 30 min  Stress: No Stress Concern Present (01/07/2024)   [employer] of Occupational Health - Occupational Stress Questionnaire    Feeling of Stress : Not at all  Social Connections: Socially Integrated (01/07/2024)   Social Connection and Isolation Panel [NHANES]    Frequency of Communication with Friends and Family: More than three times a week    Frequency of Social Gatherings with Friends and Family: Three times a week    Attends Religious Services: More than 4 times per year    Active Member of Clubs or Organizations: Yes    Attends Banker Meetings: More than 4 times per year    Marital Status: Married  Catering manager Violence: Not At Risk (01/08/2024)   Humiliation, Afraid, Rape, and Kick questionnaire    Fear of Current or Ex-Partner: No    Emotionally Abused: No    Physically Abused: No    Sexually Abused: No    Family History  Problem Relation Age of Onset   Heart disease Mother    Gallstones Mother    Snoring Father    Esophageal varices Father    Diabetes Paternal Grandmother    Colon cancer Neg Hx    Colon polyps Neg Hx    Esophageal cancer Neg Hx    Rectal cancer Neg Hx    Stomach cancer Neg Hx      Review of Systems  Constitutional: Negative.  Negative for fever.  HENT: Negative.  Negative for congestion and sore  throat.   Respiratory: Negative.  Negative for cough and shortness of breath.   Cardiovascular: Negative.  Negative for chest pain and palpitations.  Gastrointestinal:  Negative for abdominal pain, nausea and vomiting.  Skin:  Positive for rash.  Neurological:  Negative for dizziness and headaches.  All other systems reviewed and are negative.   Vitals:   01/21/24 0853  BP: 126/78  Pulse: 62  Temp: 98 F (36.7 C)  SpO2: 96%  Physical Exam Constitutional:      Appearance: Normal appearance.  Eyes:     Extraocular Movements: Extraocular movements intact.  Cardiovascular:     Rate and Rhythm: Normal rate.  Pulmonary:     Effort: Pulmonary effort is normal.  Skin:    General: Skin is warm and dry.     Comments: Right ankle.:  Medial aspect shows area of erythema and swelling compatible with skin infection.  See picture below.  Neurological:     Mental Status: He is alert and oriented to person, place, and time.  Psychiatric:        Behavior: Behavior normal.      ASSESSMENT & PLAN: A total of 30 minutes was spent with the patient and counseling/coordination of care regarding preparing for this visit, review of most recent office visit notes, review of most recent right foot x-ray report, review of chronic medical conditions under management, review of all medications, diagnosis of skin infection and need for antibiotics, prognosis, documentation, and need for follow-up if no better or worse during the next several days.  Problem List Items Addressed This Visit       Musculoskeletal and Integument   Insect bite of right lower leg with infection - Primary   Clinically stable.  No systemic signs or symptoms Local wound care discussed Recommend to start antibiotic, cefadroxil  500 mg twice a day for 7 days Advised to contact the office if no better or worse during the next several days.      Relevant Medications   cefadroxil  (DURICEF) 500 MG capsule   Patient  Instructions  Insect Bite, Adult An insect bite can make your skin red, itchy, and swollen. Some insects can spread disease to people with a bite. However, most insect bites do not lead to disease, and most are not serious. What are the causes? Insects may bite for many reasons, including: Hunger. To defend themselves. Insects that bite include: Spiders. Mosquitoes. Flies. Ticks and fleas. Ants. Kissing bugs. Chiggers. What are the signs or symptoms? Symptoms often last for 2-4 days. However, itching can last up to 10 days. Symptoms include: Itching or pain in the bite area. Redness and swelling in the bite area. An open wound. In rare cases, a person may have a very bad allergic reaction (anaphylactic reaction) to a bite. Symptoms of an anaphylactic reaction may include: Feeling warm in the face (flushed). Your face may turn red. Itchy, red, swollen areas of skin (hives). Swelling of the eyes, lips, face, mouth, tongue, or throat. Trouble with breathing, talking, or swallowing. High-pitched whistling sounds, most often when breathing out (wheezing). Feeling dizzy or light-headed. Fainting. Pain or cramps in your belly (abdomen). Vomiting. Watery poop (diarrhea). How is this treated? Most insect bites are not serious. Symptoms often go away on their own. When treatment is advised, it may include: Putting ice on the bite area. Putting a cream or lotion, like calamine lotion, on the bite area. This helps with itching. Using medicines called antihistamines. You may also need: A tetanus shot if you are not up to date. An antibiotic cream or medicine. This treatment is needed if the bite area gets infected. Follow these instructions at home: Bite area care  Do not scratch the bite area. It may help to cover the bite area with a bandage or close-fitting clothing. Keep the bite area clean and dry. Check the bite area every day for signs of infection. Check for: More redness,  swelling, or pain. Fluid or  blood. Warmth. Pus or a bad smell. Wash your hands often. Managing pain, itching, and swelling  You may put any of these on the bite area as told by your doctor: A paste made of baking soda and water. Cortisone cream. Calamine lotion. If told, put ice on the bite area. To do this: Put ice in a plastic bag. Place a towel between your skin and the bag. Leave the ice on for 20 minutes, 2-3 times a day. If your skin turns bright red, take off the ice right away to prevent skin damage. The risk of skin damage is higher if you cannot feel pain, heat, or cold. General instructions Apply or take over-the-counter and prescription medicines only as told by your doctor. If you were prescribed antibiotics, take or apply them as told by your doctor. Do not stop using them even if you start to feel better. How is this prevented? To help you have a lower risk of insect bites: When you are outside, wear clothes that cover your arms and legs. Use insect repellent. The best insect repellents contain one of these: DEET. Picaridin. Oil of lemon eucalyptus (OLE). IR3535. Consider spraying your clothing with a pesticide called permethrin. Permethrin helps prevent insect bites. It works for several weeks and for up to 5-6 clothing washes. Do not apply permethrin directly to the skin. If your home windows do not have screens, think about putting some in. If you will be sleeping in an area where there are mosquitoes, consider covering your sleeping area with a mosquito net. Contact a doctor if: You have redness, swelling, or pain in the bite area. You have fluid or blood coming from the bite area. The bite area feels warm to the touch. You have pus or a bad smell coming from the bite area. You have a fever. Get help right away if: You have joint pain. You have a rash. You feel weak or more tired than you normally do. You have neck pain or a headache. You have signs of an  anaphylactic reaction. Signs may include: Swelling of your eyes, lips, face, mouth, tongue, or throat. Feeling warm in the face. Itchy, red, swollen areas of skin. Trouble with breathing, talking, or swallowing. Wheezing. Feeling dizzy or light-headed. Fainting. Pain or cramps in your belly. Vomiting or watery poop. These symptoms may be an emergency. Get help right away. Call 911. Do not wait to see if symptoms will go away. Do not drive yourself to the hospital. Summary An insect bite can make your skin red, itchy, and swollen. Treatment is usually not needed. Symptoms often go away on their own. Do not scratch the bite area. Keep it clean and dry. Use insect repellent to help prevent insect bites. Contact a doctor if you have signs of infection. This information is not intended to replace advice given to you by your health care provider. Make sure you discuss any questions you have with your health care provider. Document Revised: 10/30/2021 Document Reviewed: 10/30/2021 Elsevier Patient Education  2024 Elsevier Inc.      Maryagnes Small, MD [facility]

## 2024-01-21 NOTE — Assessment & Plan Note (Signed)
 Clinically stable.  No systemic signs or symptoms Local wound care discussed Recommend to start antibiotic, cefadroxil  500 mg twice a day for 7 days Advised to contact the office if no better or worse during the next several days.

## 2024-01-21 NOTE — Patient Instructions (Signed)
Insect Bite, Adult An insect bite can make your skin red, itchy, and swollen. Some insects can spread disease to people with a bite. However, most insect bites do not lead to disease, and most are not serious. What are the causes? Insects may bite for many reasons, including: Hunger. To defend themselves. Insects that bite include: Spiders. Mosquitoes. Flies. Ticks and fleas. Ants. Kissing bugs. Chiggers. What are the signs or symptoms? Symptoms often last for 2-4 days. However, itching can last up to 10 days. Symptoms include: Itching or pain in the bite area. Redness and swelling in the bite area. An open wound. In rare cases, a person may have a very bad allergic reaction (anaphylactic reaction) to a bite. Symptoms of an anaphylactic reaction may include: Feeling warm in the face (flushed). Your face may turn red. Itchy, red, swollen areas of skin (hives). Swelling of the eyes, lips, face, mouth, tongue, or throat. Trouble with breathing, talking, or swallowing. High-pitched whistling sounds, most often when breathing out (wheezing). Feeling dizzy or light-headed. Fainting. Pain or cramps in your belly (abdomen). Vomiting. Watery poop (diarrhea). How is this treated? Most insect bites are not serious. Symptoms often go away on their own. When treatment is advised, it may include: Putting ice on the bite area. Putting a cream or lotion, like calamine lotion, on the bite area. This helps with itching. Using medicines called antihistamines. You may also need: A tetanus shot if you are not up to date. An antibiotic cream or medicine. This treatment is needed if the bite area gets infected. Follow these instructions at home: Bite area care  Do not scratch the bite area. It may help to cover the bite area with a bandage or close-fitting clothing. Keep the bite area clean and dry. Check the bite area every day for signs of infection. Check for: More redness, swelling, or  pain. Fluid or blood. Warmth. Pus or a bad smell. Wash your hands often. Managing pain, itching, and swelling  You may put any of these on the bite area as told by your doctor: A paste made of baking soda and water. Cortisone cream. Calamine lotion. If told, put ice on the bite area. To do this: Put ice in a plastic bag. Place a towel between your skin and the bag. Leave the ice on for 20 minutes, 2-3 times a day. If your skin turns bright red, take off the ice right away to prevent skin damage. The risk of skin damage is higher if you cannot feel pain, heat, or cold. General instructions Apply or take over-the-counter and prescription medicines only as told by your doctor. If you were prescribed antibiotics, take or apply them as told by your doctor. Do not stop using them even if you start to feel better. How is this prevented? To help you have a lower risk of insect bites: When you are outside, wear clothes that cover your arms and legs. Use insect repellent. The best insect repellents contain one of these: DEET. Picaridin. Oil of lemon eucalyptus (OLE). IR3535. Consider spraying your clothing with a pesticide called permethrin. Permethrin helps prevent insect bites. It works for several weeks and for up to 5-6 clothing washes. Do not apply permethrin directly to the skin. If your home windows do not have screens, think about putting some in. If you will be sleeping in an area where there are mosquitoes, consider covering your sleeping area with a mosquito net. Contact a doctor if: You have redness, swelling, or pain   in the bite area. You have fluid or blood coming from the bite area. The bite area feels warm to the touch. You have pus or a bad smell coming from the bite area. You have a fever. Get help right away if: You have joint pain. You have a rash. You feel weak or more tired than you normally do. You have neck pain or a headache. You have signs of an anaphylactic  reaction. Signs may include: Swelling of your eyes, lips, face, mouth, tongue, or throat. Feeling warm in the face. Itchy, red, swollen areas of skin. Trouble with breathing, talking, or swallowing. Wheezing. Feeling dizzy or light-headed. Fainting. Pain or cramps in your belly. Vomiting or watery poop. These symptoms may be an emergency. Get help right away. Call 911. Do not wait to see if symptoms will go away. Do not drive yourself to the hospital. Summary An insect bite can make your skin red, itchy, and swollen. Treatment is usually not needed. Symptoms often go away on their own. Do not scratch the bite area. Keep it clean and dry. Use insect repellent to help prevent insect bites. Contact a doctor if you have signs of infection. This information is not intended to replace advice given to you by your health care provider. Make sure you discuss any questions you have with your health care provider. Document Revised: 10/30/2021 Document Reviewed: 10/30/2021 Elsevier Patient Education  2024 [employer].  

## 2024-02-03 ENCOUNTER — Other Ambulatory Visit: Payer: Self-pay | Admitting: Internal Medicine

## 2024-02-24 ENCOUNTER — Ambulatory Visit: Admitting: Internal Medicine

## 2024-02-24 ENCOUNTER — Encounter: Payer: Self-pay | Admitting: Internal Medicine

## 2024-02-24 VITALS — BP 130/70 | HR 71 | Ht 67.0 in | Wt 245.2 lb

## 2024-02-24 DIAGNOSIS — E119 Type 2 diabetes mellitus without complications: Secondary | ICD-10-CM

## 2024-02-24 DIAGNOSIS — Z7985 Long-term (current) use of injectable non-insulin antidiabetic drugs: Secondary | ICD-10-CM

## 2024-02-24 DIAGNOSIS — E1122 Type 2 diabetes mellitus with diabetic chronic kidney disease: Secondary | ICD-10-CM

## 2024-02-24 DIAGNOSIS — E785 Hyperlipidemia, unspecified: Secondary | ICD-10-CM

## 2024-02-24 DIAGNOSIS — Z7984 Long term (current) use of oral hypoglycemic drugs: Secondary | ICD-10-CM

## 2024-02-24 DIAGNOSIS — Z794 Long term (current) use of insulin: Secondary | ICD-10-CM | POA: Diagnosis not present

## 2024-02-24 LAB — POCT GLYCOSYLATED HEMOGLOBIN (HGB A1C): Hemoglobin A1C: 8.6 % — AB (ref 4.0–5.6)

## 2024-02-24 MED ORDER — ATORVASTATIN CALCIUM 10 MG PO TABS
10.0000 mg | ORAL_TABLET | Freq: Every day | ORAL | 3 refills | Status: AC
Start: 2024-02-24 — End: ?

## 2024-02-24 MED ORDER — SEMAGLUTIDE(0.25 OR 0.5MG/DOS) 2 MG/3ML ~~LOC~~ SOPN: PEN_INJECTOR | SUBCUTANEOUS | 3 refills | Status: AC

## 2024-02-24 NOTE — Progress Notes (Signed)
 Today patient ID: Rodney Turner Olla Mickey., male   DOB: March 15, 1951, 73 y.o.   MRN: [id number]   HPI: Rodney Reimers. is a 73 y.o.-year-old male, initially referred by his PCP, Dr. Humberto, presenting for f/u for DM2, dx in 03/2016, insulin -dependent, uncontrolled, with complications (mild CKD). His wife, Rodney Turner, is also my pt. Last visit 8 months ago.  Interim history: No increased urination, nausea, chest pain.  His blurry vision completely resolved after cataract surgery in spring 2024. He is snacking more after dinner.  He was less active since last visit after hurting his foot in the last month. No stress fracture. He just restarted walking and going to the gym.  Reviewed history: In 01/2016, he started to feel very fatigued, frequent urination, nocturia, weight loss - saw PCP >> HbA1c very high (>14%). Prev. HbA1c levels were in the 6-7% range.  Reviewed HbA1c levels: Lab Results  Component Value Date   HGBA1C 7.8 (A) 07/08/2023   HGBA1C 10.0 (A) 05/08/2023   HGBA1C 6.5 (A) 07/18/2022   HGBA1C 6.1 (A) 03/14/2022   HGBA1C 11.1 (A) 09/21/2021   HGBA1C 7.9 (A) 12/25/2020   HGBA1C 7.3 (A) 12/31/2019   HGBA1C 5.9 (A) 05/19/2018   HGBA1C 5.9 11/17/2017   HGBA1C 5.5 05/19/2017   HGBA1C 5.9 01/15/2017   HGBA1C 5.6 10/15/2016   HGBA1C 5.7 07/24/2016   HGBA1C >14.0 04/16/2016   HGBA1C 6.6 01/20/2015   HGBA1C 6.3 05/24/2014   Pt is on a regimen of: - Metformin  1000 mg at dinnertime - Glipizide  ER 5 mg before b'fast  - Glipizide  ER 5 mg before dinner (3 halves of a tablet) - Tresiba  U200 16 >> 20 >> 14-18 >> 24 >> 18-20 >> 24 >> 30 >> 34 units daily He contacted me in 07/2019 with high blood sugars.  I suggested Ozempic  but he did not start this as he did not want to use injectables.  He also refused to start Farxiga .  Pt checks his sugars 1-2 times a day: - am: 117-171, 182 >> 200-260 >> 132-200, 224 >> 166-210, 223 - 2h after b'fast: n/c - before lunch: 115-137, 171 >>  <150 >> n/c >> 121-180 >> 175 - 2h after lunch: 230 (double cheeseburger and fries) >> n/c  - before dinner: 77-130, 150 >> 190 >> 117-133, 155 >> 160, 180 - 2h after dinner: n/c >> 120-159 >> n/c - bedtime: <180 >> 175, 180 >> n/c >> 94, 124-170, 180 >> n/c - nighttime: n/c Lowest sugar was 77 >> 140 >> 94 >> 115; he has hypoglycemia awareness in the 70s. Highest sugar was 300s >> .[id number] >> 260 >> 224  Pt's meals are: - Breakfast: [employer] >> burrito - Lunch: grilled chicken sandwich - Dinner: [employer] or [employer] salad or meat + veggies + starch - Snacks: 1: chips, popcorn, fruit cups, fresh fruit >> cough drops, pork rinds He was previously going to the gym.  He has a treadmill.  -+ Mild CKD. Last BUN/creatinine:  Lab Results  Component Value Date   BUN 22 05/08/2023   BUN 13 10/23/2021   CREATININE 1.14 05/08/2023   CREATININE 0.91 10/23/2021   No results found for: MICRALBCREAT  -+ HL; last set of lipids: Lab Results  Component Value Date   CHOL 112 05/08/2023   HDL 30.20 (L) 05/08/2023   LDLCALC 39 05/08/2023   TRIG 214.0 (H) 05/08/2023   CHOLHDL 4 05/08/2023  On Lipitor 10.  - last eye exam was 04/2023: No DR. +  mild cataracts. Rodney Turner. Had cataract sx in 04 and 12/2022.  -No numbness and tingling in his feet.  Last foot exam 05/08/2023.  He also has OSA - on CPAP. He fell and fractured his left humerus on 11/26/2020.  He retired 05/2017.  ROS: + see HPI  I reviewed pt's medications, allergies, PMH, social hx, family hx, and changes were documented in the history of present illness. Otherwise, unchanged from my initial visit note.  Past Medical History:  Diagnosis Date   Allergic rhinitis    Allergy    Cataract    forming   Diabetes mellitus (HCC) 05/24/2014   History of kidney stones 04/19/1989   Hyperlipidemia    Morbid obesity (HCC) 03/17/2014   OBSTRUCTIVE SLEEP APNEA 02/22/2008   HST 2013:  AHI 51/hr.  Optimal pressure 12cm on  autotitration.     OSA on CPAP    Sleep apnea    on cpap   Past Surgical History:  Procedure Laterality Date   COLONOSCOPY  2009   KNEE SURGERY     left   Social History   Social History   Marital status: Married    Spouse name: N/A   Number of children: 2   Occupational History   sales    Social History Main Topics   Smoking status: Never Smoker   Smokeless tobacco: Never Used   Alcohol use No   Drug use: No   Current Outpatient Medications on File Prior to Visit  Medication Sig Dispense Refill   acetaminophen  (TYLENOL ) 650 MG CR tablet Take 650 mg by mouth every 8 (eight) hours as needed for pain.     aspirin 81 MG tablet Take 81 mg by mouth daily.     atorvastatin  (LIPITOR) 10 MG tablet TAKE 1 TABLET EVERY DAY AT 6PM 90 tablet 3   BD PEN NEEDLE NANO 2ND GEN 32G X 4 MM MISC USE EVERY DAY AS DIRECTED 100 each 3   Blood Glucose Monitoring Suppl (ONE TOUCH ULTRA 2) w/Device KIT Use to check sugars daily. 1 each 0   fluticasone  (FLONASE ) 50 MCG/ACT nasal spray Place 1 spray into both nostrils daily.     glipiZIDE  (GLUCOTROL  XL) 5 MG 24 hr tablet TAKE 1 TABLET BEFORE BREAKFAST AND 1 TABLET BEFORE SUPPER 180 tablet 0   guaiFENesin (MUCINEX) 600 MG 12 hr tablet Take 1,200 mg by mouth 2 (two) times daily as needed for cough or to loosen phlegm.      ibuprofen (ADVIL,MOTRIN) 100 MG tablet Take 100 mg by mouth every 6 (six) hours as needed for pain.      insulin  degludec (TRESIBA  FLEXTOUCH) 200 UNIT/ML FlexTouch Pen Inject 30-34 Units into the skin daily. Inject 30 units daily 18 mL 3   Lancets (ONETOUCH ULTRASOFT) lancets Use as instructed check sugar one time daily. 100 each 6   metFORMIN  (GLUCOPHAGE ) 500 MG tablet TAKE 1 TABLET BY MOUTH TWICE A DAY WITH FOOD 180 tablet 3   ONETOUCH ULTRA test strip USE AS DIRECTED ONCE DAILY (E11.9) 100 strip 6   triamcinolone  cream (KENALOG ) 0.1 % APPLY  CREAM EXTERNALLY TO AFFECTED AREA TWICE DAILY 45 g 3   No current facility-administered  medications on file prior to visit.   Allergies  Allergen Reactions   Codeine Other (See Comments)    REACTION: nausea   Family History  Problem Relation Age of Onset   Heart disease Mother    Gallstones Mother    Snoring Father    Esophageal  varices Father    Diabetes Paternal Grandmother    Colon cancer Neg Hx    Colon polyps Neg Hx    Esophageal cancer Neg Hx    Rectal cancer Neg Hx    Stomach cancer Neg Hx    PE: BP 130/70   Pulse 71   Ht 5' 7 (1.702 m)   Wt 245 lb 3.2 oz (111.2 kg)   SpO2 95%   BMI 38.40 kg/m  Wt Readings from Last 10 Encounters:  02/24/24 245 lb 3.2 oz (111.2 kg)  01/21/24 253 lb (114.8 kg)  01/14/24 248 lb (112.5 kg)  01/08/24 246 lb (111.6 kg)  09/24/23 252 lb (114.3 kg)  07/08/23 248 lb 6.4 oz (112.7 kg)  05/08/23 247 lb 9.6 oz (112.3 kg)  07/18/22 243 lb 9.6 oz (110.5 kg)  03/21/22 242 lb (109.8 kg)  03/14/22 242 lb 9.6 oz (110 kg)   Constitutional: overweight, in NAD Eyes: EOMI, no exophthalmos ENT: no thyromegaly, no cervical lymphadenopathy Cardiovascular: RRR, No MRG Respiratory: CTA B Musculoskeletal: no deformities Skin: + rash (NLD?) on medial side of left lower leg Neurological: no tremor with outstretched hands Diabetic Foot Exam - Simple   Simple Foot Form Diabetic Foot exam was performed with the following findings: Yes 02/24/2024  3:30 PM  Visual Inspection No deformities, no ulcerations, no other skin breakdown bilaterally: Yes Sensation Testing Intact to touch and monofilament testing bilaterally: Yes Pulse Check Posterior Tibialis and Dorsalis pulse intact bilaterally: Yes Comments    ASSESSMENT: 1. DM2, insulin -dependent, uncontrolled, with complications -Mild CKD  2. HL  3. Obesity class 3  PLAN:  1. Patient with uncontrolled type 2 diabetes, on metformin , sulfonylurea, and long-acting insulin , returning after another long absence of 8 months.  Sugars were better at last visit, still increasing in the  morning above target but lower than before.  Sugars were improving as the day went by.  I did suggest to increase the Tresiba  dose at night.  I easily suggested a CGM but he was not able to obtain it and at last visit he declines trying this again. - At today's visit, sugars are quite elevated in the morning and they remain elevated throughout the day.  He was an active since last visit after her obtain his foot and he also relaxed his diet and had more snacks at night.  We discussed that both of these can contribute to the higher blood sugars.  He is now starting back walking outside.  He also has a treadmill at home but he is not using it.  We discussed about trying not to walk outside when it is this hot.  We also discussed about adding Ozempic , which he now agrees to start.  Discussed about the benefits and possible side effects.  I advised him to start at a low dose and increase as tolerated.  I am hoping that we can start reducing his glipizide  and insulin  after sugars improve on Ozempic . - I suggested to:  Patient Instructions  Please continue: - Metformin  1000 mg at dinnertime - Glipizide  ER 5 mg before b'fast  - Glipizide  ER 5 mg before dinner (3 halves of a tablet) - Tresiba  U200 34 units daily   Please start Ozempic  0.25 mg weekly in a.m. (for example on Sunday morning) x 2-4 weeks, then increase to 0.5 mg weekly in a.m. if no nausea or hypoglycemia.   Please return in 3-4 months.  - we checked his HbA1c: 8.6% (higher)  - advised  to check sugars at different times of the day - 1x a day, rotating check times - advised for yearly eye exams >> he is UTD - will check an ACR today - return to clinic in 3-4 months  2. HL - Latest lipid panel was reviewed from 04/2023: Triglycerides elevated, LDL at goal, HDL low: Lab Results  Component Value Date   CHOL 112 05/08/2023   HDL 30.20 (L) 05/08/2023   LDLCALC 39 05/08/2023   TRIG 214.0 (H) 05/08/2023   CHOLHDL 4 05/08/2023  - He continues  on Lipitor 10 mg daily without side effects.  I refilled this for him today  3.  Obesity class 3 - He previously refused SGLT2 inhibitors and GLP-1 receptor agonist, which would have helped with weight loss - He gained 1 pound before last visit, previously gained 4 - He lost 3 pounds since last visit  [name], MD PhD [facility]

## 2024-02-24 NOTE — Patient Instructions (Addendum)
 Please continue: - Metformin  1000 mg at dinnertime - Glipizide  ER 5 mg before b'fast  - Glipizide  ER 5 mg before dinner (3 halves of a tablet) - Tresiba  U200 34 units daily   Please start Ozempic  0.25 mg weekly in a.m. (for example on Sunday morning) x 2-4 weeks, then increase to 0.5 mg weekly in a.m. if no nausea or hypoglycemia.   Please return in 3-4 months.

## 2024-02-24 NOTE — Addendum Note (Signed)
 Addended by: CLEOTILDE ROLIN RAMAN on: 02/24/2024 03:44 PM   Modules accepted: Orders

## 2024-02-25 ENCOUNTER — Ambulatory Visit: Payer: Self-pay | Admitting: Internal Medicine

## 2024-02-25 LAB — MICROALBUMIN / CREATININE URINE RATIO
Creatinine, Urine: 84 mg/dL (ref 20–320)
Microalb Creat Ratio: 2 mg/g{creat} (ref <30)
Microalb, Ur: 0.2 mg/dL

## 2024-03-23 ENCOUNTER — Telehealth: Payer: Self-pay

## 2024-03-23 MED ORDER — ONDANSETRON HCL 4 MG PO TABS: 4.0000 mg | ORAL_TABLET | Freq: Three times a day (TID) | ORAL | 1 refills | Status: AC | PRN

## 2024-03-23 NOTE — Telephone Encounter (Signed)
 Patient called stating that he has been having some nausea while being on Ozempic  0.25 mg x 4 weeks. Its now time for him to increase to 0.5 mg.   What leads up to the nausea:  1.Eating/snacking right before bed and goes to lay down.  2. Eating a big meal  *Pt knows he should not eat right before laying down*  He is wanting to know should he just deal with the nausea and increase or what do you advise?

## 2024-04-15 ENCOUNTER — Other Ambulatory Visit: Payer: Self-pay | Admitting: Internal Medicine

## 2024-04-15 DIAGNOSIS — E1122 Type 2 diabetes mellitus with diabetic chronic kidney disease: Secondary | ICD-10-CM

## 2024-04-20 ENCOUNTER — Other Ambulatory Visit: Payer: Self-pay | Admitting: Internal Medicine

## 2024-04-20 DIAGNOSIS — E1122 Type 2 diabetes mellitus with diabetic chronic kidney disease: Secondary | ICD-10-CM

## 2024-04-21 ENCOUNTER — Ambulatory Visit: Payer: Self-pay | Admitting: Emergency Medicine

## 2024-04-21 ENCOUNTER — Ambulatory Visit: Admitting: Emergency Medicine

## 2024-04-21 ENCOUNTER — Encounter: Payer: Self-pay | Admitting: Emergency Medicine

## 2024-04-21 VITALS — BP 114/70 | HR 68 | Temp 98.1°F | Ht 67.0 in | Wt 245.0 lb

## 2024-04-21 DIAGNOSIS — Z125 Encounter for screening for malignant neoplasm of prostate: Secondary | ICD-10-CM | POA: Diagnosis not present

## 2024-04-21 DIAGNOSIS — Z0001 Encounter for general adult medical examination with abnormal findings: Secondary | ICD-10-CM | POA: Diagnosis not present

## 2024-04-21 DIAGNOSIS — G4733 Obstructive sleep apnea (adult) (pediatric): Secondary | ICD-10-CM

## 2024-04-21 DIAGNOSIS — E1169 Type 2 diabetes mellitus with other specified complication: Secondary | ICD-10-CM

## 2024-04-21 DIAGNOSIS — E785 Hyperlipidemia, unspecified: Secondary | ICD-10-CM

## 2024-04-21 DIAGNOSIS — Z1329 Encounter for screening for other suspected endocrine disorder: Secondary | ICD-10-CM | POA: Diagnosis not present

## 2024-04-21 DIAGNOSIS — Z13228 Encounter for screening for other metabolic disorders: Secondary | ICD-10-CM

## 2024-04-21 DIAGNOSIS — Z7984 Long term (current) use of oral hypoglycemic drugs: Secondary | ICD-10-CM | POA: Diagnosis not present

## 2024-04-21 DIAGNOSIS — E1122 Type 2 diabetes mellitus with diabetic chronic kidney disease: Secondary | ICD-10-CM

## 2024-04-21 DIAGNOSIS — Z130 Encounter for screening for diseases of the blood and blood-forming organs and certain disorders involving the immune mechanism: Secondary | ICD-10-CM

## 2024-04-21 LAB — CBC WITH DIFFERENTIAL/PLATELET
Basophils Absolute: 0.1 K/uL (ref 0.0–0.1)
Basophils Relative: 0.8 % (ref 0.0–3.0)
Eosinophils Absolute: 0.2 K/uL (ref 0.0–0.7)
Eosinophils Relative: 2 % (ref 0.0–5.0)
HCT: 42.2 % (ref 39.0–52.0)
Hemoglobin: 14.1 g/dL (ref 13.0–17.0)
Lymphocytes Relative: 35.2 % (ref 12.0–46.0)
Lymphs Abs: 3 K/uL (ref 0.7–4.0)
MCHC: 33.5 g/dL (ref 30.0–36.0)
MCV: 93.3 fl (ref 78.0–100.0)
Monocytes Absolute: 0.7 K/uL (ref 0.1–1.0)
Monocytes Relative: 7.7 % (ref 3.0–12.0)
Neutro Abs: 4.6 K/uL (ref 1.4–7.7)
Neutrophils Relative %: 54.3 % (ref 43.0–77.0)
Platelets: 249 K/uL (ref 150.0–400.0)
RBC: 4.52 Mil/uL (ref 4.22–5.81)
RDW: 14 % (ref 11.5–15.5)
WBC: 8.5 K/uL (ref 4.0–10.5)

## 2024-04-21 LAB — COMPREHENSIVE METABOLIC PANEL WITH GFR
ALT: 19 U/L (ref 0–53)
AST: 19 U/L (ref 0–37)
Albumin: 4.1 g/dL (ref 3.5–5.2)
Alkaline Phosphatase: 62 U/L (ref 39–117)
BUN: 13 mg/dL (ref 6–23)
CO2: 28 meq/L (ref 19–32)
Calcium: 9.2 mg/dL (ref 8.4–10.5)
Chloride: 101 meq/L (ref 96–112)
Creatinine, Ser: 0.96 mg/dL (ref 0.40–1.50)
GFR: 78.23 mL/min (ref >60.00)
Glucose, Bld: 151 mg/dL — ABNORMAL HIGH (ref 70–99)
Potassium: 4.9 meq/L (ref 3.5–5.1)
Sodium: 138 meq/L (ref 135–145)
Total Bilirubin: 0.4 mg/dL (ref 0.2–1.2)
Total Protein: 7.3 g/dL (ref 6.0–8.3)

## 2024-04-21 LAB — PSA: PSA: 0.11 ng/mL (ref 0.10–4.00)

## 2024-04-21 LAB — LIPID PANEL
Cholesterol: 98 mg/dL (ref 0–200)
HDL: 32 mg/dL — ABNORMAL LOW (ref >39.00)
LDL Cholesterol: 40 mg/dL (ref 0–99)
NonHDL: 66.48
Total CHOL/HDL Ratio: 3
Triglycerides: 130 mg/dL (ref 0.0–149.0)
VLDL: 26 mg/dL (ref 0.0–40.0)

## 2024-04-21 NOTE — Assessment & Plan Note (Signed)
 Diet and nutrition discussed Advised to decrease amount of daily carbohydrate intake and daily calories and increase amount of plant-based protein in his diet Recently started on Ozempic  Benefits of exercise discussed

## 2024-04-21 NOTE — Assessment & Plan Note (Signed)
 Lab Results  Component Value Date   HGBA1C 8.6 (A) 02/24/2024  Hemoglobin A1c still not at goal Sees endocrinologist on a regular basis Most recent office visit notes reviewed Diabetes medications handled by their office Advised to stay well-hydrated and avoid NSAIDs

## 2024-04-21 NOTE — Assessment & Plan Note (Signed)
 Much improved on CPAP treatment

## 2024-04-21 NOTE — Assessment & Plan Note (Signed)
 Hemoglobin A1c is still not at goal Diet and nutrition discussed Cardiovascular risks associated with uncontrolled diabetes discussed Diet and nutrition discussed Benefits of exercise discussed Continues atorvastatin  10 mg daily Continues Tresiba  insulin  34 units daily, metformin  500 mg twice a day and glipizide  5 mg daily Recently started on Ozempic .  Taking 0.5 mg weekly

## 2024-04-21 NOTE — Progress Notes (Signed)
 Rodney Turner. 73 y.o.   Chief Complaint  Patient presents with   Annual Exam    Patient here for physical. No other concerns    HISTORY OF PRESENT ILLNESS: This is a 73 y.o. male here for annual exam and follow-up on chronic medical conditions Overall doing well.  Has no complaints or medical concerns today. Lab Results  Component Value Date   HGBA1C 8.6 (A) 02/24/2024   Wt Readings from Last 3 Encounters:  04/21/24 245 lb (111.1 kg)  02/24/24 245 lb 3.2 oz (111.2 kg)  01/21/24 253 lb (114.8 kg)  Most recent endocrinologist office visit assessment and plan as follows: ASSESSMENT: 1. DM2, insulin -dependent, uncontrolled, with complications -Mild CKD   2. HL   3. Obesity class 3   PLAN:  1. Patient with uncontrolled type 2 diabetes, on metformin , sulfonylurea, and long-acting insulin , returning after another long absence of 8 months.  Sugars were better at last visit, still increasing in the morning above target but lower than before.  Sugars were improving as the day went by.  I did suggest to increase the Tresiba  dose at night.  I easily suggested a CGM but he was not able to obtain it and at last visit he declines trying this again. - At today's visit, sugars are quite elevated in the morning and they remain elevated throughout the day.  He was an active since last visit after her obtain his foot and he also relaxed his diet and had more snacks at night.  We discussed that both of these can contribute to the higher blood sugars.  He is now starting back walking outside.  He also has a treadmill at home but he is not using it.  We discussed about trying not to walk outside when it is this hot.  We also discussed about adding Ozempic , which he now agrees to start.  Discussed about the benefits and possible side effects.  I advised him to start at a low dose and increase as tolerated.  I am hoping that we can start reducing his glipizide  and insulin  after sugars improve on  Ozempic . - I suggested to:  Patient Instructions  Please continue: - Metformin  1000 mg at dinnertime - Glipizide  ER 5 mg before b'fast  - Glipizide  ER 5 mg before dinner (3 halves of a tablet) - Tresiba  U200 34 units daily    Please start Ozempic  0.25 mg weekly in a.m. (for example on Sunday morning) x 2-4 weeks, then increase to 0.5 mg weekly in a.m. if no nausea or hypoglycemia.   Please return in 3-4 months.   - we checked his HbA1c: 8.6% (higher)  - advised to check sugars at different times of the day - 1x a day, rotating check times - advised for yearly eye exams >> he is UTD - will check an ACR today - return to clinic in 3-4 months   2. HL - Latest lipid panel was reviewed from 04/2023: Triglycerides elevated, LDL at goal, HDL low: Recent Labs       Lab Results  Component Value Date    CHOL 112 05/08/2023    HDL 30.20 (L) 05/08/2023    LDLCALC 39 05/08/2023    TRIG 214.0 (H) 05/08/2023    CHOLHDL 4 05/08/2023    - He continues on Lipitor 10 mg daily without side effects.  I refilled this for him today   3.  Obesity class 3 - He previously refused SGLT2 inhibitors and GLP-1 receptor  agonist, which would have helped with weight loss - He gained 1 pound before last visit, previously gained 4 - He lost 3 pounds since last visit   Rodney Fendt, MD PhD [facility]   HPI   Prior to Admission medications   Medication Sig Start Date End Date Taking? Authorizing Provider  acetaminophen  (TYLENOL ) 650 MG CR tablet Take 650 mg by mouth every 8 (eight) hours as needed for pain.   Yes [provider]  aspirin 81 MG tablet Take 81 mg by mouth daily.   Yes [provider]  atorvastatin  (LIPITOR) 10 MG tablet Take 1 tablet (10 mg total) by mouth daily. 02/24/24  Yes Turner Lela, MD  BD PEN NEEDLE NANO 2ND GEN 32G X 4 MM MISC USE EVERY DAY AS DIRECTED 02/04/24  Yes Turner Lela, MD  Blood Glucose Monitoring Suppl (ONE TOUCH ULTRA 2)  w/Device KIT Use to check sugars daily. 06/03/16  Yes Turner Lela, MD  fluticasone  (FLONASE ) 50 MCG/ACT nasal spray Place 1 spray into both nostrils daily.   Yes [provider]  glipiZIDE  (GLUCOTROL  XL) 5 MG 24 hr tablet TAKE 1 TABLET BEFORE BREAKFAST AND 1 TABLET BEFORE SUPPER 04/16/24  Yes Turner Lela, MD  guaiFENesin (MUCINEX) 600 MG 12 hr tablet Take 1,200 mg by mouth 2 (two) times daily as needed for cough or to loosen phlegm.    Yes [provider]  ibuprofen (ADVIL,MOTRIN) 100 MG tablet Take 100 mg by mouth every 6 (six) hours as needed for pain.    Yes [provider]  insulin  degludec (TRESIBA  FLEXTOUCH) 200 UNIT/ML FlexTouch Pen Inject 30-34 Units into the skin daily. Inject 30 units daily 08/06/23  Yes Turner Lela, MD  Lancets [facility] ULTRASOFT) lancets Use as instructed check sugar one time daily. 11/21/21  Yes Turner Lela, MD  metFORMIN  (GLUCOPHAGE ) 500 MG tablet TAKE 1 TABLET BY MOUTH TWICE A DAY WITH FOOD 02/04/24  Yes Turner Lela, MD  ondansetron  (ZOFRAN ) 4 MG tablet Take 1 tablet (4 mg total) by mouth every 8 (eight) hours as needed for nausea or vomiting. 03/23/24  Yes Turner Lela, MD  [facility] ULTRA test strip USE AS DIRECTED ONCE DAILY 04/16/24  Yes Turner Lela, MD  Semaglutide ,0.25 or 0.5MG /DOS, 2 MG/3ML SOPN Please inject under skin 0.5 mg weekly 02/24/24  Yes Turner Lela, MD  triamcinolone  cream (KENALOG ) 0.1 % APPLY  CREAM EXTERNALLY TO AFFECTED AREA TWICE DAILY 12/18/23  Yes Rodney Emil Schanz, MD    Allergies  Allergen Reactions   Codeine Other (See Comments)    REACTION: nausea    Patient Active Problem List   Diagnosis Date Noted   Type 2 diabetes mellitus with chronic kidney disease (HCC) 05/19/2018   Hyperlipidemia 06/06/2014   LBBB (left bundle branch block) 06/06/2014   Morbid obesity (HCC) 03/17/2014   Obstructive sleep apnea 02/22/2008    Past Medical History:  Diagnosis Date    Allergic rhinitis    Allergy    Cataract    forming   Diabetes mellitus (HCC) 05/24/2014   History of kidney stones 04/19/1989   Hyperlipidemia    Morbid obesity (HCC) 03/17/2014   OBSTRUCTIVE SLEEP APNEA 02/22/2008   HST 2013:  AHI 51/hr.  Optimal pressure 12cm on autotitration.     OSA on CPAP    Sleep apnea    on cpap    Past Surgical History:  Procedure Laterality Date   COLONOSCOPY  2009   KNEE SURGERY     left  Social History   Socioeconomic History   Marital status: Married    Spouse name: Charlott   Number of children: 2   Years of education: Not on file   Highest education level: Bachelor's degree (e.g., BA, AB, BS)  Occupational History   Occupation: Airline pilot   Occupation: RETIRED  Tobacco Use   Smoking status: Never   Smokeless tobacco: Never  Vaping Use   Vaping status: Never Used  Substance and Sexual Activity   Alcohol use: No   Drug use: No   Sexual activity: Yes    Birth control/protection: None  Other Topics Concern   Not on file  Social History Narrative   Lives with wife and 1 dog/2025   Social Drivers of Health   Financial Resource Strain: Low Risk  (01/07/2024)   Overall Financial Resource Strain (CARDIA)    Difficulty of Paying Living Expenses: Not hard at all  Food Insecurity: No Food Insecurity (01/07/2024)   Hunger Vital Sign    Worried About Running Out of Food in the Last Year: Never true    Ran Out of Food in the Last Year: Never true  Transportation Needs: No Transportation Needs (01/07/2024)   PRAPARE - Administrator, Civil Service (Medical): No    Lack of Transportation (Non-Medical): No  Physical Activity: Sufficiently Active (01/07/2024)   Exercise Vital Sign    Days of Exercise per Week: 5 days    Minutes of Exercise per Session: 30 min  Stress: No Stress Concern Present (01/07/2024)   [employer] of Occupational Health - Occupational Stress Questionnaire    Feeling of Stress : Not at all  Social  Connections: Socially Integrated (01/07/2024)   Social Connection and Isolation Panel    Frequency of Communication with Friends and Family: More than three times a week    Frequency of Social Gatherings with Friends and Family: Three times a week    Attends Religious Services: More than 4 times per year    Active Member of Clubs or Organizations: Yes    Attends Banker Meetings: More than 4 times per year    Marital Status: Married  Catering manager Violence: Not At Risk (01/08/2024)   Humiliation, Afraid, Rape, and Kick questionnaire    Fear of Current or Ex-Partner: No    Emotionally Abused: No    Physically Abused: No    Sexually Abused: No    Family History  Problem Relation Age of Onset   Heart disease Mother    Gallstones Mother    Snoring Father    Esophageal varices Father    Diabetes Paternal Grandmother    Colon cancer Neg Hx    Colon polyps Neg Hx    Esophageal cancer Neg Hx    Rectal cancer Neg Hx    Stomach cancer Neg Hx      Review of Systems  Constitutional: Negative.  Negative for chills and fever.  HENT: Negative.  Negative for congestion and sore throat.   Respiratory: Negative.  Negative for cough and shortness of breath.   Cardiovascular: Negative.  Negative for chest pain and palpitations.  Gastrointestinal:  Negative for abdominal pain, diarrhea, nausea and vomiting.  Genitourinary: Negative.  Negative for dysuria and hematuria.  Skin: Negative.  Negative for rash.  Neurological: Negative.  Negative for dizziness and headaches.  All other systems reviewed and are negative.   Vitals:   04/21/24 0913  BP: 114/70  Pulse: 68  Temp: 98.1 F (36.7 C)  SpO2: 97%    Physical Exam Vitals reviewed.  Constitutional:      Appearance: Normal appearance. He is obese.  HENT:     Head: Normocephalic.     Right Ear: Tympanic membrane, ear canal and external ear normal.     Left Ear: Tympanic membrane, ear canal and external ear normal.      Mouth/Throat:     Mouth: Mucous membranes are moist.     Pharynx: Oropharynx is clear.  Eyes:     Extraocular Movements: Extraocular movements intact.     Pupils: Pupils are equal, round, and reactive to light.  Cardiovascular:     Rate and Rhythm: Normal rate and regular rhythm.     Pulses: Normal pulses.     Heart sounds: Normal heart sounds.  Pulmonary:     Effort: Pulmonary effort is normal.     Breath sounds: Normal breath sounds.  Abdominal:     Palpations: Abdomen is soft.     Tenderness: There is no abdominal tenderness.  Musculoskeletal:     Cervical back: No tenderness.  Lymphadenopathy:     Cervical: No cervical adenopathy.  Skin:    General: Skin is warm and dry.     Capillary Refill: Capillary refill takes less than 2 seconds.  Neurological:     General: No focal deficit present.     Mental Status: He is alert and oriented to person, place, and time.  Psychiatric:        Mood and Affect: Mood normal.        Behavior: Behavior normal.      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)   Lab Results  Component Value Date   HGBA1C 8.6 (A) 02/24/2024  Hemoglobin A1c still not at goal Sees endocrinologist on a regular basis Most recent office visit notes reviewed Diabetes medications handled by their office Advised to stay well-hydrated and avoid NSAIDs        Relevant Orders   CBC with Differential/Platelet   Comprehensive metabolic panel with GFR   Dyslipidemia associated with type 2 diabetes mellitus (HCC)   Hemoglobin A1c is still not at goal Diet and nutrition discussed Cardiovascular risks associated with uncontrolled diabetes discussed Diet and nutrition discussed Benefits of exercise discussed Continues atorvastatin  10 mg daily Continues Tresiba  insulin  34  units daily, metformin  500 mg twice a day and glipizide  5 mg daily Recently started on Ozempic .  Taking 0.5 mg weekly      Relevant Orders   Comprehensive metabolic panel with GFR   Lipid panel     Other   Morbid obesity (HCC)   Diet and nutrition discussed Advised to decrease amount of daily carbohydrate intake and daily calories and increase amount of plant-based protein in his diet Recently started on Ozempic  Benefits of exercise discussed      Other Visit Diagnoses       Encounter for general adult medical examination with abnormal findings    -  Primary   Relevant Orders   CBC with Differential/Platelet   Comprehensive metabolic panel with GFR   Lipid panel   PSA     Screening for deficiency anemia       Relevant Orders   CBC with Differential/Platelet     Screening  for endocrine, metabolic and immunity disorder       Relevant Orders   Comprehensive metabolic panel with GFR     Screening for prostate cancer       Relevant Orders   PSA      Modifiable risk factors discussed with patient. Anticipatory guidance according to age provided. The following topics were also discussed: Social Determinants of Health Smoking.  Non-smoker Diet and nutrition Benefits of exercise Cancer screening and review of most recent colonoscopy report from 2023 Vaccinations review and recommendations Cardiovascular risk assessment and need for blood work Review of multiple chronic medical conditions and their management Review of all medications Mental health including depression and anxiety Fall and accident prevention  Patient Instructions  Health Maintenance, Male Adopting a healthy lifestyle and getting preventive care are important in promoting health and wellness. Ask your health care provider about: The right schedule for you to have regular tests and exams. Things you can do on your own to prevent diseases and keep yourself healthy. What should I know about diet, weight,  and exercise? Eat a healthy diet  Eat a diet that includes plenty of vegetables, fruits, low-fat dairy products, and lean protein. Do not eat a lot of foods that are high in solid fats, added sugars, or sodium. Maintain a healthy weight Body mass index (BMI) is a measurement that can be used to identify possible weight problems. It estimates body fat based on height and weight. Your health care provider can help determine your BMI and help you achieve or maintain a healthy weight. Get regular exercise Get regular exercise. This is one of the most important things you can do for your health. Most adults should: Exercise for at least 150 minutes each week. The exercise should increase your heart rate and make you sweat (moderate-intensity exercise). Do strengthening exercises at least twice a week. This is in addition to the moderate-intensity exercise. Spend less time sitting. Even light physical activity can be beneficial. Watch cholesterol and blood lipids Have your blood tested for lipids and cholesterol at 74 years of age, then have this test every 5 years. You may need to have your cholesterol levels checked more often if: Your lipid or cholesterol levels are high. You are older than 74 years of age. You are at high risk for heart disease. What should I know about cancer screening? Many types of cancers can be detected early and may often be prevented. Depending on your health history and family history, you may need to have cancer screening at various ages. This may include screening for: Colorectal cancer. Prostate cancer. Skin cancer. Lung cancer. What should I know about heart disease, diabetes, and high blood pressure? Blood pressure and heart disease High blood pressure causes heart disease and increases the risk of stroke. This is more likely to develop in people who have high blood pressure readings or are overweight. Talk with your health care provider about your target blood  pressure readings. Have your blood pressure checked: Every 3-5 years if you are 20-23 years of age. Every year if you are 37 years old or older. If you are between the ages of 43 and 39 and are a current or former smoker, ask your health care provider if you should have a one-time screening for abdominal aortic aneurysm (AAA). Diabetes Have regular diabetes screenings. This checks your fasting blood sugar level. Have the screening done: Once every three years after age 42 if you are at a normal weight and  have a low risk for diabetes. More often and at a younger age if you are overweight or have a high risk for diabetes. What should I know about preventing infection? Hepatitis B If you have a higher risk for hepatitis B, you should be screened for this virus. Talk with your health care provider to find out if you are at risk for hepatitis B infection. Hepatitis C Blood testing is recommended for: Everyone born from [birth year] through 1966. Anyone with known risk factors for hepatitis C. Sexually transmitted infections (STIs) You should be screened each year for STIs, including gonorrhea and chlamydia, if: You are sexually active and are younger than 74 years of age. You are older than 74 years of age and your health care provider tells you that you are at risk for this type of infection. Your sexual activity has changed since you were last screened, and you are at increased risk for chlamydia or gonorrhea. Ask your health care provider if you are at risk. Ask your health care provider about whether you are at high risk for HIV. Your health care provider may recommend a prescription medicine to help prevent HIV infection. If you choose to take medicine to prevent HIV, you should first get tested for HIV. You should then be tested every 3 months for as long as you are taking the medicine. Follow these instructions at home: Alcohol use Do not drink alcohol if your health care provider tells you not to  drink. If you drink alcohol: Limit how much you have to 0-2 drinks a day. Know how much alcohol is in your drink. In the [place] one drink equals one 12 oz bottle of beer (355 mL), one 5 oz glass of wine (148 mL), or one 1 oz glass of hard liquor (44 mL). Lifestyle Do not use any products that contain nicotine or tobacco. These products include cigarettes, chewing tobacco, and vaping devices, such as e-cigarettes. If you need help quitting, ask your health care provider. Do not use street drugs. Do not share needles. Ask your health care provider for help if you need support or information about quitting drugs. General instructions Schedule regular health, dental, and eye exams. Stay current with your vaccines. Tell your health care provider if: You often feel depressed. You have ever been abused or do not feel safe at home. Summary Adopting a healthy lifestyle and getting preventive care are important in promoting health and wellness. Follow your health care provider's instructions about healthy diet, exercising, and getting tested or screened for diseases. Follow your health care provider's instructions on monitoring your cholesterol and blood pressure. This information is not intended to replace advice given to you by your health care provider. Make sure you discuss any questions you have with your health care provider. Document Revised: 12/25/2020 Document Reviewed: 12/25/2020 Elsevier Patient Education  2024 [employer]     Emil Schaumann, MD [facility]

## 2024-04-21 NOTE — Patient Instructions (Signed)
 Health Maintenance, Male  Adopting a healthy lifestyle and getting preventive care are important in promoting health and wellness. Ask your health care provider about:  The right schedule for you to have regular tests and exams.  Things you can do on your own to prevent diseases and keep yourself healthy.  What should I know about diet, weight, and exercise?  Eat a healthy diet    Eat a diet that includes plenty of vegetables, fruits, low-fat dairy products, and lean protein.  Do not eat a lot of foods that are high in solid fats, added sugars, or sodium.  Maintain a healthy weight  Body mass index (BMI) is a measurement that can be used to identify possible weight problems. It estimates body fat based on height and weight. Your health care provider can help determine your BMI and help you achieve or maintain a healthy weight.  Get regular exercise  Get regular exercise. This is one of the most important things you can do for your health. Most adults should:  Exercise for at least 150 minutes each week. The exercise should increase your heart rate and make you sweat (moderate-intensity exercise).  Do strengthening exercises at least twice a week. This is in addition to the moderate-intensity exercise.  Spend less time sitting. Even light physical activity can be beneficial.  Watch cholesterol and blood lipids  Have your blood tested for lipids and cholesterol at 74 years of age, then have this test every 5 years.  You may need to have your cholesterol levels checked more often if:  Your lipid or cholesterol levels are high.  You are older than 74 years of age.  You are at high risk for heart disease.  What should I know about cancer screening?  Many types of cancers can be detected early and may often be prevented. Depending on your health history and family history, you may need to have cancer screening at various ages. This may include screening for:  Colorectal cancer.  Prostate cancer.  Skin cancer.  Lung  cancer.  What should I know about heart disease, diabetes, and high blood pressure?  Blood pressure and heart disease  High blood pressure causes heart disease and increases the risk of stroke. This is more likely to develop in people who have high blood pressure readings or are overweight.  Talk with your health care provider about your target blood pressure readings.  Have your blood pressure checked:  Every 3-5 years if you are 24-52 years of age.  Every year if you are 3 years old or older.  If you are between the ages of 60 and 72 and are a current or former smoker, ask your health care provider if you should have a one-time screening for abdominal aortic aneurysm (AAA).  Diabetes  Have regular diabetes screenings. This checks your fasting blood sugar level. Have the screening done:  Once every three years after age 66 if you are at a normal weight and have a low risk for diabetes.  More often and at a younger age if you are overweight or have a high risk for diabetes.  What should I know about preventing infection?  Hepatitis B  If you have a higher risk for hepatitis B, you should be screened for this virus. Talk with your health care provider to find out if you are at risk for hepatitis B infection.  Hepatitis C  Blood testing is recommended for:  Everyone born from [birth year] through 1966.  Anyone  with known risk factors for hepatitis C.  Sexually transmitted infections (STIs)  You should be screened each year for STIs, including gonorrhea and chlamydia, if:  You are sexually active and are younger than 74 years of age.  You are older than 74 years of age and your health care provider tells you that you are at risk for this type of infection.  Your sexual activity has changed since you were last screened, and you are at increased risk for chlamydia or gonorrhea. Ask your health care provider if you are at risk.  Ask your health care provider about whether you are at high risk for HIV. Your health care provider  may recommend a prescription medicine to help prevent HIV infection. If you choose to take medicine to prevent HIV, you should first get tested for HIV. You should then be tested every 3 months for as long as you are taking the medicine.  Follow these instructions at home:  Alcohol use  Do not drink alcohol if your health care provider tells you not to drink.  If you drink alcohol:  Limit how much you have to 0-2 drinks a day.  Know how much alcohol is in your drink. In the [place] one drink equals one 12 oz bottle of beer (355 mL), one 5 oz glass of wine (148 mL), or one 1 oz glass of hard liquor (44 mL).  Lifestyle  Do not use any products that contain nicotine or tobacco. These products include cigarettes, chewing tobacco, and vaping devices, such as e-cigarettes. If you need help quitting, ask your health care provider.  Do not use street drugs.  Do not share needles.  Ask your health care provider for help if you need support or information about quitting drugs.  General instructions  Schedule regular health, dental, and eye exams.  Stay current with your vaccines.  Tell your health care provider if:  You often feel depressed.  You have ever been abused or do not feel safe at home.  Summary  Adopting a healthy lifestyle and getting preventive care are important in promoting health and wellness.  Follow your health care provider's instructions about healthy diet, exercising, and getting tested or screened for diseases.  Follow your health care provider's instructions on monitoring your cholesterol and blood pressure.  This information is not intended to replace advice given to you by your health care provider. Make sure you discuss any questions you have with your health care provider.  Document Revised: 12/25/2020 Document Reviewed: 12/25/2020  Elsevier Patient Education  2024 [employer].

## 2024-06-24 ENCOUNTER — Other Ambulatory Visit: Payer: Self-pay | Admitting: Internal Medicine

## 2024-06-29 ENCOUNTER — Ambulatory Visit: Admitting: Internal Medicine

## 2024-06-29 ENCOUNTER — Encounter: Payer: Self-pay | Admitting: Internal Medicine

## 2024-06-29 VITALS — BP 112/60 | HR 65 | Resp 20 | Ht 67.0 in | Wt 243.2 lb

## 2024-06-29 DIAGNOSIS — E785 Hyperlipidemia, unspecified: Secondary | ICD-10-CM | POA: Diagnosis not present

## 2024-06-29 DIAGNOSIS — E1122 Type 2 diabetes mellitus with diabetic chronic kidney disease: Secondary | ICD-10-CM | POA: Diagnosis not present

## 2024-06-29 DIAGNOSIS — Z7984 Long term (current) use of oral hypoglycemic drugs: Secondary | ICD-10-CM | POA: Diagnosis not present

## 2024-06-29 DIAGNOSIS — Z7985 Long-term (current) use of injectable non-insulin antidiabetic drugs: Secondary | ICD-10-CM

## 2024-06-29 DIAGNOSIS — Z794 Long term (current) use of insulin: Secondary | ICD-10-CM | POA: Diagnosis not present

## 2024-06-29 LAB — POCT GLYCOSYLATED HEMOGLOBIN (HGB A1C): Hemoglobin A1C: 6.5 % — AB (ref 4.0–5.6)

## 2024-06-29 MED ORDER — TRESIBA FLEXTOUCH 200 UNIT/ML ~~LOC~~ SOPN: 30.0000 [IU] | PEN_INJECTOR | Freq: Every day | SUBCUTANEOUS | 3 refills | Status: AC

## 2024-06-29 NOTE — Patient Instructions (Addendum)
 Please continue: - Metformin  1000 mg at dinnertime - Glipizide  ER 5 mg before b'fast and use 2-3 halves of a tablet before dinner - Tresiba  U200 34 units daily  - Ozempic  0.5 mg weekly  You need an eye exam.  Please return in 3-4 months.

## 2024-06-29 NOTE — Addendum Note (Signed)
 Addended by: ARELIA DIETRICH SAILOR on: 06/29/2024 03:11 PM   Modules accepted: Orders

## 2024-06-29 NOTE — Progress Notes (Signed)
 Today patient ID: Rodney Turner., male   DOB: 30-Jan-1951, 73 y.o.   MRN: [id number]   HPI: Rodney Turner. is a 73 y.o.-year-old male, initially referred by his PCP, Dr. Humberto, presenting for f/u for DM2, dx in 03/2016, insulin -dependent, uncontrolled, with complications (mild CKD). His wife, [name], is also my pt. Last visit 8 months ago.  Interim history: No increased urination, nausea, chest pain, blurry vision.   Before last visit, he just restarted walking and going to the gym. Now just walking. He had a little nausea/acid reflux after starting Ozempic  >> now tolerated well. He is wife are out of town recently, visiting their son in [place].  Reviewed HbA1c levels: Lab Results  Component Value Date   HGBA1C 8.6 (A) 02/24/2024   HGBA1C 7.8 (A) 07/08/2023   HGBA1C 10.0 (A) 05/08/2023   HGBA1C 6.5 (A) 07/18/2022   HGBA1C 6.1 (A) 03/14/2022   HGBA1C 11.1 (A) 09/21/2021   HGBA1C 7.9 (A) 12/25/2020   HGBA1C 7.3 (A) 12/31/2019   HGBA1C 5.9 (A) 05/19/2018   HGBA1C 5.9 11/17/2017   HGBA1C 5.5 05/19/2017   HGBA1C 5.9 01/15/2017   HGBA1C 5.6 10/15/2016   HGBA1C 5.7 07/24/2016   HGBA1C >14.0 04/16/2016   HGBA1C 6.6 01/20/2015   HGBA1C 6.3 05/24/2014   Pt is on a regimen of: - Metformin  1000 mg at dinnertime - Glipizide  ER 5 mg before b'fast  - Glipizide  ER 5 mg before dinner (3 halves of a tablet) - Tresiba  U200 16 >> ... 24 >> 30 >> 34 units daily - Ozempic  0.25 >> 0.5 mg weekly  He declined Farxiga .  Pt checks his sugars 1-2 times a day: - am: 200-260 >> 132-200, 224 >> 166-210, 223 >> 128-190, 207 - 2h after b'fast: n/c - before lunch: <150 >> n/c >> 121-180 >> 175 >> 134-144 - 2h after lunch: 230 (double cheeseburger and fries) >> n/c  - before dinner:190 >> 117-133, 155 >> 160, 180 >> 86-150 - 2h after dinner: n/c >> 120-159 >> n/c - bedtime: 175, 180 >> n/c >> 94, 124-170, 180 >> n/c >> 142-144 - nighttime: n/c Lowest sugar was 94 >> 115 >> 86; he  has hypoglycemia awareness in the 70s. Highest sugar was 300s >> .SABRA.260 >> 224 >> 207  Pt's meals are: - Breakfast: [employer] >> burrito - Lunch: grilled chicken sandwich - Dinner: [employer] or [employer] salad or meat + veggies + starch - Snacks: 1: chips, popcorn, fruit cups, fresh fruit >> cough drops, pork rinds He was previously going to the gym.  He has a treadmill.  -+ Mild CKD. Last BUN/creatinine:  Lab Results  Component Value Date   BUN 13 04/21/2024   BUN 22 05/08/2023   CREATININE 0.96 04/21/2024   CREATININE 1.14 05/08/2023   Lab Results  Component Value Date   MICRALBCREAT 2 02/24/2024   -+ HL; last set of lipids: Lab Results  Component Value Date   CHOL 98 04/21/2024   HDL 32.00 (L) 04/21/2024   LDLCALC 40 04/21/2024   TRIG 130.0 04/21/2024   CHOLHDL 3 04/21/2024  On Lipitor 10.  - last eye exam was 04/2023: No DR. + mild cataracts. Dr. Cleotilde. Had cataract sx in 04 and 12/2022. His blurry vision completely resolved after cataract surgery.  -No numbness and tingling in his feet.  Last foot exam 02/24/2024.  He also has OSA - on CPAP. He fell and fractured his left humerus on 11/26/2020.  He retired 05/2017.  ROS: +  see HPI  I reviewed pt's medications, allergies, PMH, social hx, family hx, and changes were documented in the history of present illness. Otherwise, unchanged from my initial visit note.  Past Medical History:  Diagnosis Date   Allergic rhinitis    Allergy    Cataract    forming   Diabetes mellitus (HCC) 05/24/2014   History of kidney stones 04/19/1989   Hyperlipidemia    Morbid obesity (HCC) 03/17/2014   OBSTRUCTIVE SLEEP APNEA 02/22/2008   HST 2013:  AHI 51/hr.  Optimal pressure 12cm on autotitration.     OSA on CPAP    Sleep apnea    on cpap   Past Surgical History:  Procedure Laterality Date   COLONOSCOPY  2009   KNEE SURGERY     left   Social History   Social History   Marital status: Married    Spouse name: N/A    Number of children: 2   Occupational History   sales    Social History Main Topics   Smoking status: Never Smoker   Smokeless tobacco: Never Used   Alcohol use No   Drug use: No   Current Outpatient Medications on File Prior to Visit  Medication Sig Dispense Refill   acetaminophen  (TYLENOL ) 650 MG CR tablet Take 650 mg by mouth every 8 (eight) hours as needed for pain.     aspirin 81 MG tablet Take 81 mg by mouth daily.     atorvastatin  (LIPITOR) 10 MG tablet Take 1 tablet (10 mg total) by mouth daily. 90 tablet 3   BD PEN NEEDLE NANO 2ND GEN 32G X 4 MM MISC USE EVERY DAY AS DIRECTED 100 each 3   Blood Glucose Monitoring Suppl (ONE TOUCH ULTRA 2) w/Device KIT Use to check sugars daily. 1 each 0   fluticasone  (FLONASE ) 50 MCG/ACT nasal spray Place 1 spray into both nostrils daily.     glipiZIDE  (GLUCOTROL  XL) 5 MG 24 hr tablet TAKE 1 TABLET BEFORE BREAKFAST AND 1 TABLET BEFORE SUPPER 180 tablet 0   glucose blood (ONETOUCH ULTRA) test strip Use to check blood sugar once a day DxCode: E11.22 100 strip 12   guaiFENesin (MUCINEX) 600 MG 12 hr tablet Take 1,200 mg by mouth 2 (two) times daily as needed for cough or to loosen phlegm.      ibuprofen (ADVIL,MOTRIN) 100 MG tablet Take 100 mg by mouth every 6 (six) hours as needed for pain.      insulin  degludec (TRESIBA  FLEXTOUCH) 200 UNIT/ML FlexTouch Pen Inject 30-34 Units into the skin daily. Inject 30 units daily 18 mL 3   Lancets (ONETOUCH ULTRASOFT) lancets Use as instructed check sugar one time daily. 100 each 6   metFORMIN  (GLUCOPHAGE ) 500 MG tablet TAKE 1 TABLET BY MOUTH TWICE A DAY WITH FOOD 180 tablet 3   ondansetron  (ZOFRAN ) 4 MG tablet Take 1 tablet (4 mg total) by mouth every 8 (eight) hours as needed for nausea or vomiting. 10 tablet 1   Semaglutide ,0.25 or 0.5MG /DOS, 2 MG/3ML SOPN Please inject under skin 0.5 mg weekly 9 mL 3   triamcinolone  cream (KENALOG ) 0.1 % APPLY  CREAM EXTERNALLY TO AFFECTED AREA TWICE DAILY 45 g 3   No  current facility-administered medications on file prior to visit.   Allergies  Allergen Reactions   Codeine Other (See Comments)    REACTION: nausea   Family History  Problem Relation Age of Onset   Heart disease Mother    Gallstones Mother  Snoring Father    Esophageal varices Father    Diabetes Paternal Grandmother    Colon cancer Neg Hx    Colon polyps Neg Hx    Esophageal cancer Neg Hx    Rectal cancer Neg Hx    Stomach cancer Neg Hx    PE: BP 112/60   Pulse 65   Resp 20   Ht 5' 7 (1.702 m)   Wt 243 lb 3.2 oz (110.3 kg)   SpO2 95%   BMI 38.09 kg/m  Wt Readings from Last 10 Encounters:  06/29/24 243 lb 3.2 oz (110.3 kg)  04/21/24 245 lb (111.1 kg)  02/24/24 245 lb 3.2 oz (111.2 kg)  01/21/24 253 lb (114.8 kg)  01/14/24 248 lb (112.5 kg)  01/08/24 246 lb (111.6 kg)  09/24/23 252 lb (114.3 kg)  07/08/23 248 lb 6.4 oz (112.7 kg)  05/08/23 247 lb 9.6 oz (112.3 kg)  07/18/22 243 lb 9.6 oz (110.5 kg)   Constitutional: overweight, in NAD Eyes: EOMI, no exophthalmos ENT: no thyromegaly, no cervical lymphadenopathy Cardiovascular: RRR, No MRG Respiratory: CTA B Musculoskeletal: no deformities Skin: + rash (NLD?) on medial side of left lower leg Neurological: no tremor with outstretched hands  ASSESSMENT: 1. DM2, insulin -dependent, uncontrolled, with complications -Mild CKD  2. HL  3. Obesity class 3  PLAN:  1. Patient with uncontrolled type 2 diabetes, on metformin , sulfonylurea, long-acting insulin , returns at last visit after a longer absence of 8 months.  HbA1c was higher, at 8.6%.  Sugars were quite elevated in the morning and remained elevated throughout the day.  I recommended to add a GLP-1 receptor agonist and increase the dose as tolerated afterwards.  He declined a CGM at that time. - At today's visit, he mentions that he is tolerating Ozempic  well.  He had some nausea and reflux after starting it, but these abated.  He did realize that larger meals  are causing the symptoms so he is reducing his portions.  He is also walking for exercise and planning to return to the gym.   -Sugars are still higher than goal in the morning but they appear to be improving in the last 2 months.  Sugars later in the day are usually at goal.  For now, I did not recommend to change in regimen at the present trying to cut down the amount of glipizide  before dinner.  I do plan to continue to reduce his glipizide  dose if sugars remain controlled. - I suggested to:  Patient Instructions  Please continue: - Metformin  1000 mg at dinnertime - Glipizide  ER 5 mg before b'fast and use 2-3 halves of a tablet before dinner - Tresiba  U200 34 units daily  - Ozempic  0.5 mg weekly  You need an eye exam.  Please return in 3-4 months.  - we checked his HbA1c: 6.5% (much better) - advised to check sugars at different times of the day - 1-2x a day, rotating check times - advised for yearly eye exams >> he is not UTD - return to clinic in 3-4 months  2. HL - Latest lipid panel was reviewed from 04/2024: Fractions at goal with the exception of a slightly low HDL, Lab Results  Component Value Date   CHOL 98 04/21/2024   HDL 32.00 (L) 04/21/2024   LDLCALC 40 04/21/2024   TRIG 130.0 04/21/2024   CHOLHDL 3 04/21/2024  - He continues on Lipitor 10 mg daily without side effects  3.  Obesity class 3 -At last visit  I suggested to start Ozempic , which should also help with weight loss.  He tolerated it well, but did have some nausea with it - He lost 3 pounds before last visit and 2 pounds since then.  [name], MD PhD [facility] Endocrinology

## 2024-07-12 ENCOUNTER — Encounter: Payer: Self-pay | Admitting: *Deleted

## 2024-07-12 NOTE — Progress Notes (Signed)
 Fredis E Whisman Jr.                                          MRN: [id number]   07/12/2024   The [facility] Quality Team Specialist reviewed this patient medical record for the purposes of chart review for care gap closure. The following were reviewed: abstraction for care gap closure-glycemic status assessment.    [facility] Quality Team

## 2024-07-12 NOTE — Progress Notes (Signed)
 Rodney Turner.                                          MRN: [id number]   07/12/2024   The [facility] Quality Team Specialist reviewed this patient medical record for the purposes of chart review for care gap closure. The following were reviewed: chart review for care gap closure-diabetic eye exam.    [facility] Quality Team

## 2024-09-19 ENCOUNTER — Other Ambulatory Visit: Payer: Self-pay | Admitting: Internal Medicine

## 2024-10-28 ENCOUNTER — Ambulatory Visit: Admitting: Internal Medicine

## 2025-01-13 ENCOUNTER — Ambulatory Visit
